# Patient Record
Sex: Female | Born: 1965 | Race: Black or African American | Hispanic: No | Marital: Single | Smoking: Never smoker
Health system: Southern US, Community
[De-identification: ages and names within clinical notes are randomized; demographics above are authoritative.]

## PROBLEM LIST (undated history)

## (undated) DIAGNOSIS — IMO0002 Reserved for concepts with insufficient information to code with codable children: Secondary | ICD-10-CM

## (undated) DIAGNOSIS — Z8742 Personal history of other diseases of the female genital tract: Secondary | ICD-10-CM

## (undated) DIAGNOSIS — R062 Wheezing: Secondary | ICD-10-CM

## (undated) DIAGNOSIS — I10 Essential (primary) hypertension: Secondary | ICD-10-CM

## (undated) DIAGNOSIS — Z8639 Personal history of other endocrine, nutritional and metabolic disease: Secondary | ICD-10-CM

## (undated) DIAGNOSIS — B338 Other specified viral diseases: Secondary | ICD-10-CM

## (undated) DIAGNOSIS — H409 Unspecified glaucoma: Secondary | ICD-10-CM

## (undated) DIAGNOSIS — H209 Unspecified iridocyclitis: Secondary | ICD-10-CM

## (undated) DIAGNOSIS — J45909 Unspecified asthma, uncomplicated: Secondary | ICD-10-CM

## (undated) DIAGNOSIS — K6389 Other specified diseases of intestine: Secondary | ICD-10-CM

## (undated) DIAGNOSIS — B974 Respiratory syncytial virus as the cause of diseases classified elsewhere: Secondary | ICD-10-CM

## (undated) DIAGNOSIS — R87619 Unspecified abnormal cytological findings in specimens from cervix uteri: Secondary | ICD-10-CM

## (undated) HISTORY — DX: Personal history of other endocrine, nutritional and metabolic disease: Z86.39

## (undated) HISTORY — DX: Essential (primary) hypertension: I10

## (undated) HISTORY — DX: Reserved for concepts with insufficient information to code with codable children: IMO0002

## (undated) HISTORY — DX: Respiratory syncytial virus as the cause of diseases classified elsewhere: B97.4

## (undated) HISTORY — PX: CHOLECYSTECTOMY: SHX55

## (undated) HISTORY — DX: Unspecified asthma, uncomplicated: J45.909

## (undated) HISTORY — DX: Wheezing: R06.2

## (undated) HISTORY — PX: COLONOSCOPY: SHX174

## (undated) HISTORY — DX: Unspecified iridocyclitis: H20.9

## (undated) HISTORY — DX: Personal history of other diseases of the female genital tract: Z87.42

## (undated) HISTORY — DX: Other specified viral diseases: B33.8

## (undated) HISTORY — DX: Other specified diseases of intestine: K63.89

---

## 1997-05-15 DIAGNOSIS — Z8639 Personal history of other endocrine, nutritional and metabolic disease: Secondary | ICD-10-CM

## 1997-05-15 HISTORY — DX: Personal history of other endocrine, nutritional and metabolic disease: Z86.39

## 1997-10-06 ENCOUNTER — Encounter: Payer: Self-pay | Admitting: Internal Medicine

## 1998-01-02 ENCOUNTER — Emergency Department (HOSPITAL_COMMUNITY): Admission: EM | Admit: 1998-01-02 | Discharge: 1998-01-02 | Payer: Self-pay | Admitting: Emergency Medicine

## 1998-01-05 ENCOUNTER — Encounter (HOSPITAL_BASED_OUTPATIENT_CLINIC_OR_DEPARTMENT_OTHER): Payer: Self-pay | Admitting: General Surgery

## 1998-01-06 ENCOUNTER — Ambulatory Visit (HOSPITAL_COMMUNITY): Admission: RE | Admit: 1998-01-06 | Discharge: 1998-01-08 | Payer: Self-pay | Admitting: General Surgery

## 2000-01-08 ENCOUNTER — Emergency Department (HOSPITAL_COMMUNITY): Admission: EM | Admit: 2000-01-08 | Discharge: 2000-01-08 | Payer: Self-pay | Admitting: Emergency Medicine

## 2000-02-03 ENCOUNTER — Encounter (INDEPENDENT_AMBULATORY_CARE_PROVIDER_SITE_OTHER): Payer: Self-pay | Admitting: Specialist

## 2000-02-03 ENCOUNTER — Other Ambulatory Visit: Admission: RE | Admit: 2000-02-03 | Discharge: 2000-02-03 | Payer: Self-pay | Admitting: Obstetrics and Gynecology

## 2000-02-10 ENCOUNTER — Encounter (INDEPENDENT_AMBULATORY_CARE_PROVIDER_SITE_OTHER): Payer: Self-pay | Admitting: Specialist

## 2000-02-10 ENCOUNTER — Other Ambulatory Visit: Admission: RE | Admit: 2000-02-10 | Discharge: 2000-02-10 | Payer: Self-pay | Admitting: Obstetrics and Gynecology

## 2000-09-04 ENCOUNTER — Other Ambulatory Visit: Admission: RE | Admit: 2000-09-04 | Discharge: 2000-09-04 | Payer: Self-pay | Admitting: Obstetrics and Gynecology

## 2000-11-29 ENCOUNTER — Encounter: Payer: Self-pay | Admitting: Internal Medicine

## 2000-12-03 ENCOUNTER — Other Ambulatory Visit: Admission: RE | Admit: 2000-12-03 | Discharge: 2000-12-03 | Payer: Self-pay | Admitting: Obstetrics and Gynecology

## 2001-03-04 ENCOUNTER — Other Ambulatory Visit: Admission: RE | Admit: 2001-03-04 | Discharge: 2001-03-04 | Payer: Self-pay | Admitting: Internal Medicine

## 2006-11-19 ENCOUNTER — Emergency Department (HOSPITAL_COMMUNITY): Admission: EM | Admit: 2006-11-19 | Discharge: 2006-11-20 | Payer: Self-pay | Admitting: Emergency Medicine

## 2006-12-11 DIAGNOSIS — E785 Hyperlipidemia, unspecified: Secondary | ICD-10-CM | POA: Insufficient documentation

## 2006-12-12 ENCOUNTER — Ambulatory Visit: Payer: Self-pay | Admitting: Internal Medicine

## 2006-12-12 DIAGNOSIS — I10 Essential (primary) hypertension: Secondary | ICD-10-CM | POA: Insufficient documentation

## 2006-12-12 DIAGNOSIS — H209 Unspecified iridocyclitis: Secondary | ICD-10-CM | POA: Insufficient documentation

## 2006-12-13 ENCOUNTER — Ambulatory Visit: Payer: Self-pay | Admitting: Internal Medicine

## 2006-12-17 ENCOUNTER — Ambulatory Visit: Payer: Self-pay | Admitting: Internal Medicine

## 2006-12-21 ENCOUNTER — Encounter: Payer: Self-pay | Admitting: Internal Medicine

## 2006-12-25 LAB — CONVERTED CEMR LAB
Angiotensin 1 Converting Enzyme: 9 units/L (ref 9–67)
Anti Nuclear Antibody(ANA): NEGATIVE

## 2006-12-28 ENCOUNTER — Telehealth: Payer: Self-pay | Admitting: Internal Medicine

## 2007-01-04 ENCOUNTER — Encounter: Payer: Self-pay | Admitting: Internal Medicine

## 2007-01-08 ENCOUNTER — Ambulatory Visit: Payer: Self-pay | Admitting: Internal Medicine

## 2007-01-10 ENCOUNTER — Ambulatory Visit: Payer: Self-pay | Admitting: Internal Medicine

## 2007-01-18 ENCOUNTER — Telehealth: Payer: Self-pay | Admitting: Internal Medicine

## 2007-02-06 ENCOUNTER — Telehealth: Payer: Self-pay | Admitting: Internal Medicine

## 2007-02-19 ENCOUNTER — Telehealth: Payer: Self-pay | Admitting: Family Medicine

## 2007-05-21 ENCOUNTER — Telehealth (INDEPENDENT_AMBULATORY_CARE_PROVIDER_SITE_OTHER): Payer: Self-pay | Admitting: *Deleted

## 2008-01-06 ENCOUNTER — Telehealth: Payer: Self-pay | Admitting: *Deleted

## 2008-05-01 ENCOUNTER — Ambulatory Visit: Payer: Self-pay | Admitting: Internal Medicine

## 2008-05-01 LAB — CONVERTED CEMR LAB
ALT: 14 units/L (ref 0–35)
AST: 20 units/L (ref 0–37)
Albumin: 3.8 g/dL (ref 3.5–5.2)
Alkaline Phosphatase: 49 units/L (ref 39–117)
BUN: 13 mg/dL (ref 6–23)
Basophils Absolute: 0 10*3/uL (ref 0.0–0.1)
Basophils Relative: 0.4 % (ref 0.0–3.0)
Bilirubin Urine: NEGATIVE
Bilirubin, Direct: 0.1 mg/dL (ref 0.0–0.3)
Blood in Urine, dipstick: NEGATIVE
CO2: 28 meq/L (ref 19–32)
Calcium: 9.9 mg/dL (ref 8.4–10.5)
Chloride: 106 meq/L (ref 96–112)
Cholesterol: 177 mg/dL (ref 0–200)
Creatinine, Ser: 0.8 mg/dL (ref 0.4–1.2)
Eosinophils Absolute: 0.1 10*3/uL (ref 0.0–0.7)
Eosinophils Relative: 1.5 % (ref 0.0–5.0)
GFR calc Af Amer: 101 mL/min
GFR calc non Af Amer: 83 mL/min
Glucose, Bld: 85 mg/dL (ref 70–99)
Glucose, Urine, Semiquant: NEGATIVE
HCT: 37.2 % (ref 36.0–46.0)
HDL: 41.1 mg/dL (ref >39.0)
Hemoglobin: 12.1 g/dL (ref 12.0–15.0)
Ketones, urine, test strip: NEGATIVE
LDL Cholesterol: 121 mg/dL — ABNORMAL HIGH (ref 0–99)
Lymphocytes Relative: 36.6 % (ref 12.0–46.0)
MCHC: 32.7 g/dL (ref 30.0–36.0)
MCV: 85 fL (ref 78.0–100.0)
Monocytes Absolute: 0.4 10*3/uL (ref 0.1–1.0)
Monocytes Relative: 9 % (ref 3.0–12.0)
Neutro Abs: 2 10*3/uL (ref 1.4–7.7)
Neutrophils Relative %: 52.5 % (ref 43.0–77.0)
Nitrite: NEGATIVE
Platelets: 173 10*3/uL (ref 150–400)
Potassium: 3.9 meq/L (ref 3.5–5.1)
Protein, U semiquant: NEGATIVE
RBC: 4.38 M/uL (ref 3.87–5.11)
RDW: 12.2 % (ref 11.5–14.6)
Sodium: 138 meq/L (ref 135–145)
Specific Gravity, Urine: 1.02
TSH: 4.2 microintl units/mL (ref 0.35–5.50)
Total Bilirubin: 0.6 mg/dL (ref 0.3–1.2)
Total CHOL/HDL Ratio: 4.3
Total Protein: 7.8 g/dL (ref 6.0–8.3)
Triglycerides: 76 mg/dL (ref 0–149)
Urobilinogen, UA: 0.2
VLDL: 15 mg/dL (ref 0–40)
WBC Urine, dipstick: NEGATIVE
WBC: 4 10*3/uL — ABNORMAL LOW (ref 4.5–10.5)
pH: 6

## 2008-05-12 ENCOUNTER — Telehealth: Payer: Self-pay | Admitting: Internal Medicine

## 2008-05-13 ENCOUNTER — Ambulatory Visit: Payer: Self-pay | Admitting: Internal Medicine

## 2008-08-25 ENCOUNTER — Encounter: Payer: Self-pay | Admitting: Internal Medicine

## 2008-10-28 ENCOUNTER — Encounter: Payer: Self-pay | Admitting: *Deleted

## 2008-10-30 ENCOUNTER — Ambulatory Visit: Payer: Self-pay | Admitting: Internal Medicine

## 2008-10-30 LAB — CONVERTED CEMR LAB
Free T4: 0.7 ng/dL (ref 0.6–1.6)
T3, Free: 2.4 pg/mL (ref 2.3–4.2)
TSH: 4.63 microintl units/mL (ref 0.35–5.50)
Thyroperoxidase Ab SerPl-aCnc: 3687.2 — ABNORMAL HIGH (ref 0.0–60.0)

## 2008-11-09 ENCOUNTER — Telehealth: Payer: Self-pay | Admitting: *Deleted

## 2008-11-20 ENCOUNTER — Ambulatory Visit: Payer: Self-pay | Admitting: Internal Medicine

## 2008-11-20 DIAGNOSIS — E063 Autoimmune thyroiditis: Secondary | ICD-10-CM | POA: Insufficient documentation

## 2008-12-18 ENCOUNTER — Encounter: Admission: RE | Admit: 2008-12-18 | Discharge: 2008-12-18 | Payer: Self-pay | Admitting: Internal Medicine

## 2009-09-15 ENCOUNTER — Telehealth: Payer: Self-pay | Admitting: *Deleted

## 2009-10-24 ENCOUNTER — Encounter (INDEPENDENT_AMBULATORY_CARE_PROVIDER_SITE_OTHER): Payer: Self-pay | Admitting: *Deleted

## 2009-10-24 ENCOUNTER — Emergency Department (HOSPITAL_COMMUNITY): Admission: EM | Admit: 2009-10-24 | Discharge: 2009-10-24 | Payer: Self-pay | Admitting: Emergency Medicine

## 2009-10-28 ENCOUNTER — Ambulatory Visit: Payer: Self-pay | Admitting: Internal Medicine

## 2009-10-28 LAB — CONVERTED CEMR LAB
ALT: 16 units/L (ref 0–35)
AST: 23 units/L (ref 0–37)
Albumin: 3.7 g/dL (ref 3.5–5.2)
Alkaline Phosphatase: 49 units/L (ref 39–117)
Basophils Absolute: 0 10*3/uL (ref 0.0–0.1)
Basophils Relative: 0.7 % (ref 0.0–3.0)
Blood in Urine, dipstick: NEGATIVE
Calcium: 9.6 mg/dL (ref 8.4–10.5)
Eosinophils Relative: 2.5 % (ref 0.0–5.0)
GFR calc non Af Amer: 104.34 mL/min (ref >60)
Glucose, Urine, Semiquant: NEGATIVE
HCT: 35.3 % — ABNORMAL LOW (ref 36.0–46.0)
HDL: 46.5 mg/dL (ref >39.00)
Hemoglobin: 11.8 g/dL — ABNORMAL LOW (ref 12.0–15.0)
LDL Cholesterol: 100 mg/dL — ABNORMAL HIGH (ref 0–99)
Lymphocytes Relative: 32.1 % (ref 12.0–46.0)
Monocytes Relative: 10.7 % (ref 3.0–12.0)
Neutro Abs: 2.3 10*3/uL (ref 1.4–7.7)
Nitrite: NEGATIVE
Potassium: 3.8 meq/L (ref 3.5–5.1)
RBC: 4.35 M/uL (ref 3.87–5.11)
RDW: 13.5 % (ref 11.5–14.6)
Sodium: 142 meq/L (ref 135–145)
Total CHOL/HDL Ratio: 3
VLDL: 15 mg/dL (ref 0.0–40.0)
WBC: 4.3 10*3/uL — ABNORMAL LOW (ref 4.5–10.5)

## 2009-11-01 ENCOUNTER — Ambulatory Visit: Payer: Self-pay | Admitting: Internal Medicine

## 2009-11-03 ENCOUNTER — Ambulatory Visit: Payer: Self-pay | Admitting: Internal Medicine

## 2009-11-03 DIAGNOSIS — D649 Anemia, unspecified: Secondary | ICD-10-CM | POA: Insufficient documentation

## 2009-11-04 ENCOUNTER — Encounter (INDEPENDENT_AMBULATORY_CARE_PROVIDER_SITE_OTHER): Payer: Self-pay | Admitting: *Deleted

## 2009-11-25 ENCOUNTER — Ambulatory Visit: Payer: Self-pay | Admitting: Internal Medicine

## 2009-11-25 DIAGNOSIS — R933 Abnormal findings on diagnostic imaging of other parts of digestive tract: Secondary | ICD-10-CM | POA: Insufficient documentation

## 2009-12-06 ENCOUNTER — Ambulatory Visit: Payer: Self-pay | Admitting: Internal Medicine

## 2009-12-09 ENCOUNTER — Telehealth: Payer: Self-pay | Admitting: Internal Medicine

## 2010-03-07 ENCOUNTER — Ambulatory Visit: Payer: Self-pay | Admitting: Internal Medicine

## 2010-03-17 LAB — CONVERTED CEMR LAB: Free T4: 0.69 ng/dL (ref 0.60–1.60)

## 2010-05-15 NOTE — L&D Delivery Note (Signed)
Delivery Note At 7:06 PM a viable female was delivered via Vaginal, Spontaneous Delivery (Presentation: Left Occiput Anterior).  APGAR: 9, 9; weight 8lbs 3 oz. .   Placenta status: Intact, Spontaneous.  Cord: 3 vessels with the following complications: None.  Anesthesia: Epidural  Episiotomy: None Lacerations: 2nd degree Suture Repair: 3.0 vicryl rapide Est. Blood Loss (mL):   Mom to postpartum.  Baby to nursery-stable. Patient desires BTL.  Discussed options, permanency and failure rate.  She would like PPBTL tomorrow.  Will schedule.  Taishawn Smaldone D 12/30/2010, 7:28 PM

## 2010-06-07 LAB — RPR: RPR: NONREACTIVE

## 2010-06-07 LAB — ABO/RH: RH Type: NEGATIVE

## 2010-06-14 NOTE — Letter (Signed)
Summary: [facility] Instructions  [facility]  [address] [place], [place] [address]   Phone: [phone]  Fax: [phone]       Monica Estrada    1965/12/26    MRN: [id number]       Procedure Day /Date: 12/06/09 Monday     Arrival Time: 2:00 pm     Procedure Time: 3:00 pm     Location of Procedure:                    _ x_  [facility] (4th Floor)  PREPARATION FOR COLONOSCOPY WITH MIRALAX  Starting 5 days prior to your procedure (12/01/09) do not eat nuts, seeds, popcorn, corn, beans, peas,  salads, or any raw vegetables.  Do not take any fiber supplements (e.g. Metamucil, Citrucel, and Benefiber). ____________________________________________________________________________________________________   THE DAY BEFORE YOUR PROCEDURE         DATE: 12/05/09 DAY: Sunday  1   Drink clear liquids the entire day-NO SOLID FOOD  2   Do not drink anything colored red or purple.  Avoid juices with pulp.  No orange juice.  3   Drink at least 64 oz. (8 glasses) of fluid/clear liquids during the day to prevent dehydration and help the prep work efficiently.  CLEAR LIQUIDS INCLUDE: Water Jello Ice Popsicles Tea (sugar ok, no milk/cream) Powdered fruit flavored drinks Coffee (sugar ok, no milk/cream) Gatorade Juice: apple, white grape, white cranberry  Lemonade Clear bullion, consomm, broth Carbonated beverages (any kind) Strained chicken noodle soup Hard Candy  4   Mix the entire bottle of Miralax with 64 oz. of Gatorade/Powerade in the morning and put in the refrigerator to chill.  5   At 3:00 pm take 2 Dulcolax/Bisacodyl tablets.  6   At 4:30 pm take one Reglan/Metoclopramide tablet.  7  Starting at 5:00 pm drink one 8 oz glass of the Miralax mixture every 15-20 minutes until you have finished drinking the entire 64 oz.  You should finish drinking prep around 7:30 or 8:00 pm.  8   If you are nauseated, you may take the 2nd Reglan/Metoclopramide tablet at  6:30 pm.        9    At 8:00 pm take 2 more DULCOLAX/Bisacodyl tablets.         THE DAY OF YOUR PROCEDURE      DATE:  12/06/09 DAY: Monday  You may drink clear liquids until 1:00 pm  (2 HOURS BEFORE PROCEDURE).   MEDICATION INSTRUCTIONS  Unless otherwise instructed, you should take regular prescription medications with a small sip of water as early as possible the morning of your procedure.         OTHER INSTRUCTIONS  You will need a responsible adult at least 46 years of age to accompany you and drive you home.   This person must remain in the waiting room during your procedure.  Wear loose fitting clothing that is easily removed.  Leave jewelry and other valuables at home.  However, you may wish to bring a book to read or an iPod/MP3 player to listen to music as you wait for your procedure to start.  Remove all body piercing jewelry and leave at home.  Total time from sign-in until discharge is approximately 2-3 hours.  You should go home directly after your procedure and rest.  You can resume normal activities the day after your procedure.  The day of your procedure you should not:   Drive  Make legal decisions   Operate machinery   Drink alcohol   Return to work  You will receive specific instructions about eating, activities and medications before you leave.   The above instructions have been reviewed and explained to me by   Lamona Curl CMA (AAMA)  November 25, 2009 12:12 PM     I fully understand and can verbalize these instructions _____________________________ Date 11/25/09

## 2010-06-14 NOTE — Progress Notes (Signed)
Summary: Pt called re: refill of Lisinopril.  Phone Note Call from Patient Call back at [facility] Phone [phone]   Caller: Patient Summary of Call: Pt called re: refill of Lisinopril. Pt said that she has made and appt for December 2011, because pt was told that as long as she made an ov, that med would be refilled. Please call.  Initial call taken by: Lucy Antigua,  Sep 15, 2009 3:23 PM  Follow-up for Phone Call        Pt was suppose to have a cpx in Dec of last year. I copied and pasted the Dec by accident to get the correct lab orders in. I tried to call pt back but number was busy. Follow-up by: Romualdo Bolk, CMA Duncan Dull),  Sep 15, 2009 5:01 PM  Additional Follow-up for Phone Call Additional follow up Details #1::        see refill request. Additional Follow-up by: Romualdo Bolk, CMA (AAMA),  Sep 17, 2009 12:52 PM

## 2010-06-14 NOTE — Procedures (Signed)
Summary: Colonoscopy  Patient: [name] Note: All result statuses are Final unless otherwise noted.  Tests: (1) Colonoscopy (COL)   COL Colonoscopy           DONE     [facility]     [address]     [place], [place]  [address]           COLONOSCOPY PROCEDURE REPORT           PATIENT:  Monica Estrada, Monica Estrada  MR#:  [id number]     BIRTHDATE:  Apr 06, 1966, 43 yrs. old  GENDER:  female     ENDOSCOPIST:  Hedwig Morton. Juanda Chance, MD     REF. BY:  Neta Mends. Panosh, M.D.     PROCEDURE DATE:  12/06/2009     PROCEDURE:  Colonoscopy [address]     ASA CLASS:  Class I     INDICATIONS:  Abdominal pain, Abnormal CT of abdomen about 6 wks     ago severe abd. pain, CT scan in ER showed acute colitis/     diverticulitis in transverse colon and hepatic flexure, she is now     well, she has been on antihypertensives     MEDICATIONS:   Versed 6 mg, Fentanyl 75 mcg           DESCRIPTION OF PROCEDURE:   After the risks benefits and     alternatives of the procedure were thoroughly explained, informed     consent was obtained.  Digital rectal exam was performed and     revealed no rectal masses.   The LB [id number] endoscope     was introduced through the anus and advanced to the cecum, which     was identified by both the appendix and ileocecal valve, without     limitations.  The quality of the prep was good, using MiraLax.     The instrument was then slowly withdrawn as the colon was fully     examined.     <<PROCEDUREIMAGES>>           FINDINGS:  No polyps or cancers were seen (see image1, image2,     image3, and image4). no evidence of diverticulosis or colitis     Retroflexed views in the rectum revealed no abnormalities.    The     scope was then withdrawn from the patient and the procedure     completed.           COMPLICATIONS:  None     ENDOSCOPIC IMPRESSION:     1) No polyps or cancers     normal colonic mucosa, no evidence of diverticulosis/itis or     colitis. I suspect pt had  ischemic colitis     RECOMMENDATIONS:     1) high fiber diet     keep blood pressure from droppinig at night, check BP's at home     at different times     REPEAT EXAM:  In 10 year(s) for.           ______________________________     Hedwig Morton. Juanda Chance, MD           CC:           n.     eSIGNED:   Hedwig Morton. Ory Elting at 12/06/2009 03:42 PM           Monica Estrada, Monica Estrada, [id number]  Note: An exclamation mark (!) indicates a result that was not dispersed into the flowsheet. Document Creation Date:  12/06/2009 3:42 PM _______________________________________________________________________  (1) Order result status: Final Collection or observation date-time: 12/06/2009 15:32 Requested date-time:  Receipt date-time:  Reported date-time:  Referring Physician:   Ordering Physician: Lina Sar [phone]) Specimen Source:  Source: Launa Grill Order Number: [id number] Lab site:   Appended Document: Colonoscopy    Clinical Lists Changes  Observations: Added new observation of COLONNXTDUE: 11/2019 (12/06/2009 15:45)

## 2010-06-14 NOTE — Progress Notes (Signed)
Summary: speak to nurse  Phone Note Call from Patient Call back at Home Phone [phone]   Caller: Patient Call For: Juanda Chance Reason for Call: Talk to Nurse Summary of Call: Patient wants to speak to nurse regarding when would she have a regular bm after procedure Monday. Initial call taken by: Tawni Levy,  December 09, 2009 1:53 PM  Follow-up for Phone Call        Patient  had colon on Monday.  No BM since the procedure.  She reports she is passing gas, no bloating, but slight discomfort from constipation.  Patient  tolerating a normal diet and her normal daily activities.  She is advised to take a dose of MOM or Miralax 17 gm as needed.  She will call back for further problems, questions, or concerns. Follow-up by: Darcey Nora RN, CGRN,  December 09, 2009 3:03 PM  Additional Follow-up for Phone Call Additional follow up Details #1::        reviewed and agree Additional Follow-up by: Hart Carwin MD,  December 11, 2009 12:03 PM

## 2010-06-14 NOTE — Assessment & Plan Note (Signed)
Summary: Post hosp/colitis/dm   Vital Signs:  Patient profile:   45 year old female Menstrual status:  regular LMP:     10/18/2009 Height:      64 inches Weight:      163 pounds BMI:     28.08 Pulse rate:   60 / minute BP sitting:   120 / 80  (left arm) Cuff size:   regular  Vitals Entered By: Romualdo Bolk, CMA (AAMA) (November 01, 2009 3:09 PM) CC: Post ED follow up . Pt was dx with colititis. Pt was given both flagyl and cipro but finished it up. Pt is feeling better but still has some pain and swelling around 2-3 am. Some diarrhea with this pain and cramping. Pain and cramping is in the center of her abd. area. Some nausea with it but no vomiting. No fever. LMP (date): 10/18/2009 LMP - Character: heavy Menarche (age onset years): 15   Menses interval (days): 28 Menstrual flow (days): 3 Enter LMP: 10/18/2009   History of Present Illness: Monica Estrada comes in today   follow up form ED on Sunday  6/12 /11. Rx for colitis diverticultits  after  presenting with one day of abd cramps severe  and no fever . Had blood tests and  ct of abd pelvis which showed  mild  edema of left colon  see report .  diff dx colitis divertic , omental infarct.  Never had this pains this bad before   . No diarrhea or blood  but  soft stools.   About  80 % better  today at end of 7 days med  .Still    feels bloated and bowels not that right . NO blood . No hx of same . No antibioitc rx previous to onset.  taking pain med as needed Hydrocodone.    BP has been ok   Preventive Screening-Counseling & Management  Alcohol-Tobacco     Alcohol drinks/day: 0     Smoking Status: never  Caffeine-Diet-Exercise     Caffeine use/day: 2     Does Patient Exercise: yes     Type of exercise: Walk     Times/week: 2  Current Medications (verified): 1)  Lisinopril-Hydrochlorothiazide 20-12.5 Mg  Tabs (Lisinopril-Hydrochlorothiazide) .... 1/2 To 1 Per Day  or As Directed  Allergies (verified): No Known Drug  Allergies  Past History:  Past medical, surgical, family and social histories (including risk factors) reviewed, and no changes noted (except as noted below).  Past Medical History: Reviewed history from 05/13/2008 and no changes required. see database uveitis and Hypertension G1P1  Hx of abnormal PAP Hx post partum hypothyroidism  1999         LAST Mammogram: 2 years ago Pap: 2009 Td: 5 years ago Colonscopy: n/a EKG: n/a Dexa: n/a Eye Exam: 200 Smoking: Never  Consults: Dr Ambrose Mantle  Past Surgical History: Reviewed history from 05/13/2008 and no changes required. Cholecystectomy 1999  Past History:  Care Management: Gynecology: Ambrose Mantle  Family History: Reviewed history from 05/13/2008 and no changes required. Family History of CAD Female 1st degree relative  mi 45  Family History Diabetes 1st degree relative SIS with lupus and RA SIS lamb disease see database strong family history of heart disease diabetes and arthritis. Father: diabetic and HBP Mother: HBP, diabetic, thyroid problems Siblings: Brother- Diabetic Son-Diabetic  Social History: Reviewed history from 05/13/2008 and no changes required. 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  The patient denies anorexia, fever, weight loss, vision loss, decreased hearing, hoarseness, chest pain, dyspnea on exertion, peripheral edema, prolonged cough, headaches, melena, hematochezia, hematuria, incontinence, muscle weakness, difficulty walking, depression, abnormal bleeding, enlarged lymph nodes, angioedema, and breast masses.    Physical Exam  General:  alert, well-developed, and well-nourished.   in nad Head:  normocephalic and atraumatic.   Eyes:  vision grossly intact.   Neck:  No deformities, masses, or tenderness noted. Lungs:  Normal respiratory effort, chest expands symmetrically. Lungs are clear to  auscultation, no crackles or wheezes. Heart:  Normal rate and regular rhythm. S1 and S2 normal without gallop, murmur, click, rub or other extra sounds. Abdomen:  soft, normal bowel sounds, no hepatomegaly, and no splenomegaly.  mild tenderness llq  noted no g or revound and no masses  Pulses:  nl cap refill  Extremities:  no clubbing cyanosis or edema  Neurologic:  non focal  Skin:  turgor normal, color normal, no ecchymoses, no petechiae, and no purpura.   Cervical Nodes:  No lymphadenopathy noted Psych:  Oriented X3, good eye contact, not anxious appearing, and not depressed appearing.   ED record report reviewed  Impression & Recommendations:  Problem # 1:  COLITIS VS POSS DIVERTICULITIS (ICD-558.9)  abd pain   seems to respond to rx for diverticulitis   . doies have hx of Uveitis and autoimmune disease in family but not really typical of IBD or UC. .    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    will discuss at cpx but  no signs related to this  Problem # 3:  HYPERTENSION (ICD-401.9) stable  Her updated medication list for this problem includes:    Lisinopril-hydrochlorothiazide 20-12.5 Mg Tabs (Lisinopril-hydrochlorothiazide) .Marland Kitchen... 1/2 to 1 per day  or as directed  Complete Medication List: 1)  Lisinopril-hydrochlorothiazide 20-12.5 Mg Tabs (Lisinopril-hydrochlorothiazide) .... 1/2 to 1 per day  or as directed 2)  Ciprofloxacin Hcl 500 Mg Tabs (Ciprofloxacin hcl) .Marland Kitchen.. 1 by mouth two times a day 3)  Flagyl 500 Mg Tabs (Metronidazole) .Marland Kitchen.. 1 by mouth  three times a day  Patient Instructions: 1)  continue  antibiotic for another  5 days   2)  your thyroid was a bit off and   will  have to follow this up. 3)  Will rec   Gi consult  also.  4)  Keep check up appt this week.  Prescriptions: FLAGYL 500 MG TABS (METRONIDAZOLE) 1 by mouth   three times a day  #25 x 0   Entered and Authorized by:   Madelin Headings MD   Signed by:   Madelin Headings MD on 11/01/2009   Method used:   Electronically to        [facility] (retail)       [address]       [place], [place]  [address]       Ph: [phone]       Fax: [phone]   RxID:   [id number] CIPROFLOXACIN HCL 500 MG TABS (CIPROFLOXACIN HCL) 1 by mouth two times a day  #10 x 0   Entered and Authorized by:   Madelin Headings MD   Signed  by:   Madelin Headings MD on 11/01/2009   Method used:   Electronically to        [facility] (retail)       [address]       [place], [place]  [address]       Ph: [phone]       Fax: [phone]   RxID:   [id number]

## 2010-06-14 NOTE — Assessment & Plan Note (Signed)
Summary: cpx with pap/ssc   Vital Signs:  Patient profile:   45 year old female Menstrual status:  regular LMP:     10/18/2009 Height:      63.75 inches Weight:      163 pounds BMI:     28.30 Pulse rate:   60 / minute BP sitting:   120 / 80  (left arm) Cuff size:   regular  Vitals Entered By: Romualdo Bolk, CMA Duncan Dull) (November 03, 2009 2:22 PM) CC: CPX no pap- Pt has a gyn who does pap LMP (date): 10/18/2009 LMP - Character: heavy Menarche (age onset years): 15   Menses interval (days): 28 Menstrual flow (days): 3 Enter LMP: 10/18/2009 Last PAP Result normal   History of Present Illness: Monica Estrada comes in today  for preventive visit and follow up of abd pain colitis ? diverticulitis as discussed .  Gi  much better  on continued antibioitc.  not tendern anymore no fever   began to exercise again BP : nl when checked and no se of med Periods ; sees Dr Ambrose Mantle   has been midly anemic before and thinks from  heavy menses. no gi bleeding described.  Preventive Care Screening  Pap Smear:    Date:  12/13/2008    Results:  normal   Prior Values:    PPD:  negative (12/17/2006)    Mammogram:  ASSESSMENT: Negative - BI-RADS 1^MM DIGITAL SCREENING (12/18/2008)    Last Tetanus Booster:  Historical (05/15/2002)   Preventive Screening-Counseling & Management  Alcohol-Tobacco     Alcohol drinks/day: 0     Smoking Status: never  Caffeine-Diet-Exercise     Caffeine use/day: 2     Does Patient Exercise: yes     Type of exercise: Walk     Times/week: 2  Hep-HIV-STD-Contraception     Dental Visit-last 6 months yes  Safety-Violence-Falls     Seat Belt Use: yes     Firearms in the Home: no firearms in the home     Smoke Detectors: yes     Fall Risk: none       Blood Transfusions:  no.    Current Medications (verified): 1)  Lisinopril-Hydrochlorothiazide 20-12.5 Mg  Tabs (Lisinopril-Hydrochlorothiazide) .... 1/2 To 1 Per Day  or As Directed 2)  Ciprofloxacin Hcl 500  Mg Tabs (Ciprofloxacin Hcl) .Marland Kitchen.. 1 By Mouth Two Times A Day 3)  Flagyl 500 Mg Tabs (Metronidazole) .Marland Kitchen.. 1 By Mouth  Three Times A Day  Allergies (verified): No Known Drug Allergies  Past History:  Past medical, surgical, family and social histories (including risk factors) reviewed, and no changes noted (except as noted below).  Past Medical History: see database  uveitis stable Hypertension G1P1  Hx of abnormal PAP Hx post partum hypothyroidism  1999 ED eval  abd CT mild  left colon edema         LAST Mammogram: 2 years ago Pap: 2009 Td: 5 years ago Colonscopy: n/a EKG: n/a Dexa: n/a Eye Exam: 200 Smoking: Never  Consults: Dr Ambrose Mantle  Past Surgical History: Reviewed history from 05/13/2008 and no changes required. Cholecystectomy 1999  Past History:  Care Management: Gynecology: Ambrose Mantle eye regular  care   Family History: Reviewed history from 05/13/2008 and no changes required. Family History of CAD Female 1st degree relative  mi 63  Family History Diabetes 1st degree relative SIS with lupus and RA SIS lamb disease see database strong family history of heart disease diabetes and arthritis. Father: diabetic and  HBP Mother: HBP, diabetic, thyroid problems Siblings: Brother- Diabetic Son-Diabetic  Social History: Reviewed history from 05/13/2008 and no changes required. 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   Physical Exam General Appearance: well developed, well nourished, no acute distress Eyes: conjunctiva and lids normal, PERRLA, EOMI, WNL  glasses Ears, Nose, Mouth, Throat: TM clear, nares clear, oral exam WNL Neck: supple, no lymphadenopathy, no thyromegaly, no JVD Respiratory: clear to auscultation and percussion, respiratory effort normal Cardiovascular: regular rate and rhythm, S1-S2, no murmur, rub or gallop, no bruits, peripheral pulses normal and symmetric, no cyanosis, clubbing, edema or varicosities Chest: no scars, masses, tenderness; no asymmetry, skin changes, nipple discharge   Gastrointestinal: soft, no to minimall tender llq ; no hepatosplenomegaly, masses; active bowel sounds all quadrants,  Genitourinary: per   GYNE Lymphatic: no cervical, axillary or inguinal adenopathy Musculoskeletal: gait normal, muscle tone and strength WNL, no joint swelling, effusions, discoloration, crepitus  Skin: clear, good turgor, color WNL, no rashes, lesions, or ulcerations Neurologic: normal mental status, normal reflexes, normal strength, sensation, and motion Psychiatric: alert; oriented to person, place and time Other Exam:  see labs     Impression & Recommendations:  Problem # 1:  HEALTH MAINTENANCE EXAM, ADULT (ICD-V70.0) Discussed nutrition,exercise,diet,healthy weight, vitamin D and calcium.   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   will wait for now and follow more closely.   repeat in 3 months rx if worsening or gets signs   Problem # 3:  HYPERTENSION (ICD-401.9) controlled  Her updated medication list for this problem includes:    Lisinopril-hydrochlorothiazide 20-12.5 Mg Tabs (Lisinopril-hydrochlorothiazide) .Marland Kitchen... 1/2 to 1 per day  or as directed  BP today: 120/80 Prior BP: 110/70 (11/20/2008)  Labs Reviewed: K+: 3.8 (10/28/2009) Creat: : 0.8 (10/28/2009)   Chol:  161 (10/28/2009)   HDL: 46.50 (10/28/2009)   LDL: 100 (10/28/2009)   TG: 75.0 (10/28/2009)  Problem # 4:  COLITIS VS POSS DIVERTICULITIS (ICD-558.9) Assessment: Improved  almost back to normal..   will do GI referral  may need colonsocopy.    see past note hx of uveitis and fam hx of auttoimmune disease but thius may be diverticulitis with reponse to antibioitcs.  Orders: Gastroenterology Referral (GI)  Problem # 5:  ANEMIA, MILD (ICD-285.9)  Hgb: 11.8 (10/28/2009)   Hct: 35.3 (10/28/2009)   Platelets: 175.0 (10/28/2009) RBC: 4.35 (10/28/2009)   RDW: 13.5 (10/28/2009)   WBC: 4.3 (10/28/2009) MCV: 81.1 (10/28/2009)   MCHC: 33.5 (10/28/2009) TSH: 6.60 (10/28/2009)  Complete Medication List: 1)  Lisinopril-hydrochlorothiazide 20-12.5 Mg Tabs (Lisinopril-hydrochlorothiazide) .... 1/2 to 1 per day  or as directed 2)  Ciprofloxacin Hcl 500 Mg Tabs (Ciprofloxacin hcl) .Marland Kitchen.. 1 by mouth two times a day 3)  Flagyl 500 Mg Tabs (Metronidazole) .Marland Kitchen.. 1 by mouth  three times a day  Patient Instructions: 1)  You are slightly anemic and could be from your period. 2)  Repeat  TSH and free T4 in  3 months.  and follow up depending on results. dx 245.2 3)  Otherwise check yearly check up.  4)  Someone will contact you about  a  Gastroenterology referral.  in the meantime  5)  call if increasing pain  again  6)  Continue BP medications . 7)  Healthy diet and exercise .

## 2010-06-14 NOTE — Assessment & Plan Note (Signed)
Summary: ABD PAIN/SIGHTLY ABD CT SCAN/YF   History of Present Illness Visit Type: consult  Primary GI MD: Lina Sar MD Primary Provider: Berniece Andreas, MD  Requesting Provider: Berniece Andreas, MD  Chief Complaint: Lower abd pain, and diarrhea  History of Present Illness:   This is a 45 year old African American female who is referred for evaluation of acute mid abdominal pain which occurred rather suddenly about a month ago necessitating evaluation in the emergency room. She woke up in the morning and went to the bathroom to have a bowel movement which appeared to be normal but afterwards developed severe abdominal pain which doubled her over. Pain persisted and she had to go to the emergency room where a CT scan of the abdomen and pelvis showed inflammatory changes in the hepatic flexure and transverse colon  consistent with either colitis or diverticulitis. There was also a questionable omental infarct. She was sent home on bowel rest, pain medications and antibiotics for 1 week.. She is now 90% improved and is back to work and tolerating  regular diet. She still has  crampy abdominal pain but no fever. There is no family history of colon cancer in direct relatives. Her maternal uncle might have had colon cancer. There is no family history of inflammatory bowel disease. She does not have a significant GI history otherwise.   GI Review of Systems    Reports abdominal pain.     Location of  Abdominal pain: lower abdomen.    Denies acid reflux, belching, bloating, chest pain, dysphagia with liquids, dysphagia with solids, heartburn, loss of appetite, nausea, vomiting, vomiting blood, weight loss, and  weight gain.      Reports diarrhea.     Denies anal fissure, black tarry stools, change in bowel habit, constipation, diverticulosis, fecal incontinence, heme positive stool, hemorrhoids, irritable bowel syndrome, jaundice, light color stool, liver problems, rectal bleeding, and  rectal pain.     Current Medications (verified): 1)  Lisinopril-Hydrochlorothiazide 20-12.5 Mg  Tabs (Lisinopril-Hydrochlorothiazide) .... 1/2 To 1 Per Day  or As Directed  Allergies (verified): No Known Drug Allergies  Past History:  Past Medical History: Reviewed history from 11/03/2009 and no changes required. see database  uveitis stable Hypertension G1P1  Hx of abnormal PAP Hx post partum hypothyroidism  1999 ED eval  abd CT mild  left colon edema         LAST Mammogram: 2 years ago Pap: 2009 Td: 5 years ago Colonscopy: n/a EKG: n/a Dexa: n/a Eye Exam: 200 Smoking: Never  Consults: Dr Ambrose Mantle  Past Surgical History: Reviewed history from 05/13/2008 and no changes required. Cholecystectomy 1999  Family History: Family History of CAD Female 1st degree relative  mi 50  Family History Diabetes 1st degree relative SIS with lupus and RA SIS lamb disease see database strong family history of heart disease diabetes and arthritis. Father: diabetic and HBP Mother: HBP, diabetic, thyroid problems Siblings: Brother- Diabetic Son-Diabetic No FH of Colon Cancer:  Social History: [name] Single Never Smoked Alcohol use-no Household of 2 pet bird hhof 2    60 yo son  Review of Systems       The patient complains of muscle pains/cramps.  The patient denies allergy/sinus, anemia, anxiety-new, arthritis/joint pain, back pain, blood in urine, breast changes/lumps, change in vision, confusion, cough, coughing up blood, depression-new, fainting, fatigue, fever, headaches-new, hearing problems, heart murmur, heart rhythm changes, itching, menstrual pain, night sweats, nosebleeds, pregnancy symptoms, shortness of breath, skin rash, sleeping problems, sore throat, swelling  of feet/legs, swollen lymph glands, thirst - excessive , urination - excessive , urination changes/pain, urine leakage, vision changes, and voice change.         Pertinent positive and negative review of systems were noted in  the above HPI. All other ROS was otherwise negative.   Vital Signs:  Patient profile:   45 year old female Menstrual status:  regular Height:      63.75 inches Weight:      158 pounds BMI:     27.43 BSA:     1.77 Pulse rate:   72 / minute Pulse rhythm:   regular BP sitting:   132 / 80  (left arm) Cuff size:   regular  Vitals Entered By: Ok Anis CMA (November 25, 2009 11:03 AM)  Physical Exam  General:  Well developed, well nourished, no acute distress. Eyes:  PERRLA, no icterus. Mouth:  No deformity or lesions, dentition normal. Neck:  Supple; no masses or thyromegaly. Lungs:  Clear throughout to auscultation. Heart:  Regular rate and rhythm; no murmurs, rubs,  or bruits. Abdomen:  Soft relaxed abdomen with mild tenderness across the periumbilical area starting in the left middle quadrant and ascending to right middle quadrant. There is no rebound, distention or palpable mass. Lower abdomen is unremarkable. There are postcholecystectomy scars from a laparoscopic cholecystectomy. Rectal:  soft Hemoccult negative stool. Extremities:  No clubbing, cyanosis, edema or deformities noted. Skin:  Intact without significant lesions or rashes. Psych:  Alert and cooperative. Normal mood and affect.   Impression & Recommendations:  Problem # 1:  COLITIS VS POSS DIVERTICULITIS (ICD-558.9) Patient is status post episode of  acute abdominal pain ,abnormal CT scan suggestive of an inflammatory process in the transverse colon. Possibilities include ischemic colitis, diverticulitis, volvulus, mesenteric infarct or possibly inflammatory bowel disease which is less likely. We will proceed with a diagnostic colonoscopy and biopsies. There is no need for repeat antibiotics ,  Other Orders: Colonoscopy (Colon)  Patient Instructions: 1)  Colonoscopy using MiraLax prep. Patient instructed and scheduled. 2)  Depending on the results, we will make a final disposition. 3)  Copy sent to : Dr Fabian Sharp 4)   The medication list was reviewed and reconciled.  All changed / newly prescribed medications were explained.  A complete medication list was provided to the patient / caregiver. Prescriptions: DULCOLAX 5 MG  TBEC (BISACODYL) Day before procedure take 2 at 3pm and 2 at 8pm.  #4 x 0   Entered by:   Lamona Curl CMA (AAMA)   Authorized by:   Hart Carwin MD   Signed by:   Lamona Curl CMA (AAMA) on 11/25/2009   Method used:   Electronically to        [facility] (retail)       [address]       [place], [place]  [address]       Ph: [phone]       Fax: [phone]   RxID:   [id number] REGLAN 10 MG  TABS (METOCLOPRAMIDE HCL) As per prep instructions.  #2 x 0   Entered by:   Lamona Curl CMA (AAMA)   Authorized by:   Hart Carwin MD   Signed by:   Lamona Curl CMA (AAMA) on 11/25/2009   Method used:   Electronically to        [facility] (retail)       [address]       [place], [place]  [address]       Ph: [phone]       Fax: [phone]   RxID:   [id number] MIRALAX   POWD (POLYETHYLENE GLYCOL 3350) As per prep  instructions.  #255gm x 0   Entered by:   Lamona Curl CMA (AAMA)   Authorized by:   Hart Carwin MD   Signed by:   Lamona Curl CMA (AAMA) on 11/25/2009   Method used:   Electronically to        [facility] (retail)       [address]       [place], [place]  [address]       Ph: [phone]       Fax: [phone]   RxID:   [id number]

## 2010-06-14 NOTE — Letter (Signed)
Summary: New Patient letter  [facility]  [address] [place], [place] [address]   Phone: [phone]  Fax: [phone]       11/04/2009 MRN: [id number]  [facility] [address] [place], [place]  [address]  Dear Ms. [facility],  Welcome to the Gastroenterology Division at [facility].    You are scheduled to see Dr.  Juanda Chance on 11-25-09 at 10:30am on the 3rd floor at [facility], [address]  We ask that you try to arrive at our office 15 minutes prior to your appointment time to allow for check-in.  We would like you to complete the enclosed self-administered evaluation form prior to your visit and bring it with you on the day of your appointment.  We will review it with you.  Also, please bring a complete list of all your medications or, if you prefer, bring the medication bottles and we will list them.  Please bring your insurance card so that we may make a copy of it.  If your insurance requires a referral to see a specialist, please bring your referral form from your primary care physician.  Co-payments are due at the time of your visit and may be paid by cash, check or credit card.     Your office visit will consist of a consult with your physician (includes a physical exam), any laboratory testing he/she may order, scheduling of any necessary diagnostic testing (e.g. x-ray, ultrasound, CT-scan), and scheduling of a procedure (e.g. Endoscopy, Colonoscopy) if required.  Please allow enough time on your schedule to allow for any/all of these possibilities.    If you cannot keep your appointment, please call [phone] to cancel or reschedule prior to your appointment date.  This allows [place] the opportunity to schedule an appointment for another patient in need of care.  If you do not cancel or reschedule by 5 p.m. the business day prior to your appointment date, you will be charged a $50.00 late cancellation/no-show fee.    Thank you for choosing [facility] for your medical needs.  We appreciate the opportunity to care for you.  Please visit [place] at our website  to learn more about our practice.                     Sincerely,                                                             The Gastroenterology Division

## 2010-08-01 LAB — CBC
HCT: 34.5 % — ABNORMAL LOW (ref 36.0–46.0)
Hemoglobin: 11.1 g/dL — ABNORMAL LOW (ref 12.0–15.0)
MCHC: 32.1 g/dL (ref 30.0–36.0)
MCV: 82.6 fL (ref 78.0–100.0)
RBC: 4.18 MIL/uL (ref 3.87–5.11)

## 2010-08-01 LAB — URINE MICROSCOPIC-ADD ON

## 2010-08-01 LAB — COMPREHENSIVE METABOLIC PANEL
Albumin: 3.6 g/dL (ref 3.5–5.2)
Alkaline Phosphatase: 54 U/L (ref 39–117)
BUN: 10 mg/dL (ref 6–23)
CO2: 23 mEq/L (ref 19–32)
Chloride: 110 mEq/L (ref 96–112)
Creatinine, Ser: 0.76 mg/dL (ref 0.4–1.2)
GFR calc non Af Amer: >60 mL/min (ref >60)
Potassium: 3.6 mEq/L (ref 3.5–5.1)
Total Bilirubin: 0.5 mg/dL (ref 0.3–1.2)

## 2010-08-01 LAB — POCT PREGNANCY, URINE: Preg Test, Ur: NEGATIVE

## 2010-08-01 LAB — URINALYSIS, ROUTINE W REFLEX MICROSCOPIC
Glucose, UA: NEGATIVE mg/dL
Protein, ur: NEGATIVE mg/dL
pH: 7.5 (ref 5.0–8.0)

## 2010-08-01 LAB — DIFFERENTIAL
Basophils Absolute: 0 10*3/uL (ref 0.0–0.1)
Basophils Relative: 1 % (ref 0–1)
Eosinophils Relative: 2 % (ref 0–5)
Lymphocytes Relative: 24 % (ref 12–46)
Monocytes Absolute: 0.3 10*3/uL (ref 0.1–1.0)
Neutro Abs: 2.3 10*3/uL (ref 1.7–7.7)

## 2010-08-01 LAB — URINE CULTURE: Colony Count: 40000

## 2010-10-21 ENCOUNTER — Encounter: Payer: Self-pay | Admitting: Internal Medicine

## 2010-10-21 DIAGNOSIS — Z8639 Personal history of other endocrine, nutritional and metabolic disease: Secondary | ICD-10-CM | POA: Insufficient documentation

## 2010-10-21 DIAGNOSIS — H209 Unspecified iridocyclitis: Secondary | ICD-10-CM | POA: Insufficient documentation

## 2010-10-21 DIAGNOSIS — K6389 Other specified diseases of intestine: Secondary | ICD-10-CM | POA: Insufficient documentation

## 2010-10-28 ENCOUNTER — Telehealth: Payer: Self-pay | Admitting: Internal Medicine

## 2010-10-28 NOTE — Telephone Encounter (Signed)
Pt is sch for cpx labs on Monday 6/18 and cpx on 11/07/10. Pt is 7 month pregnant and says that she has already had cpx by obgyn and labs done, does she still need to keep her Monday lab and cpx?

## 2010-10-28 NOTE — Telephone Encounter (Signed)
Spoke with pt and told her that since we haven't seen her in over a year to go ahead and keep these appts. She more than likely  hasn't had her lipids done and we can do everything but the pap for her cpx.

## 2010-10-31 ENCOUNTER — Other Ambulatory Visit (INDEPENDENT_AMBULATORY_CARE_PROVIDER_SITE_OTHER): Payer: Self-pay

## 2010-10-31 DIAGNOSIS — Z0000 Encounter for general adult medical examination without abnormal findings: Secondary | ICD-10-CM

## 2010-10-31 LAB — CBC WITH DIFFERENTIAL/PLATELET
Basophils Absolute: 0 10*3/uL (ref 0.0–0.1)
Eosinophils Absolute: 0.1 10*3/uL (ref 0.0–0.7)
Lymphocytes Relative: 21.3 % (ref 12.0–46.0)
MCHC: 33.3 g/dL (ref 30.0–36.0)
Monocytes Relative: 8.3 % (ref 3.0–12.0)
Neutrophils Relative %: 67.9 % (ref 43.0–77.0)
Platelets: 165 10*3/uL (ref 150.0–400.0)
RDW: 13.3 % (ref 11.5–14.6)

## 2010-10-31 LAB — BASIC METABOLIC PANEL
CO2: 22 mEq/L (ref 19–32)
Calcium: 9 mg/dL (ref 8.4–10.5)
Creatinine, Ser: 0.5 mg/dL (ref 0.4–1.2)
GFR: 188.22 mL/min (ref >60.00)
Glucose, Bld: 90 mg/dL (ref 70–99)
Sodium: 137 mEq/L (ref 135–145)

## 2010-10-31 LAB — HEPATIC FUNCTION PANEL
AST: 31 U/L (ref 0–37)
Albumin: 2.8 g/dL — ABNORMAL LOW (ref 3.5–5.2)
Alkaline Phosphatase: 78 U/L (ref 39–117)
Bilirubin, Direct: 0.1 mg/dL (ref 0.0–0.3)
Total Bilirubin: 0.4 mg/dL (ref 0.3–1.2)

## 2010-10-31 LAB — POCT URINALYSIS DIPSTICK
Ketones, UA: NEGATIVE
Protein, UA: NEGATIVE
Urobilinogen, UA: 0.2
pH, UA: 7

## 2010-10-31 LAB — LIPID PANEL
HDL: 67 mg/dL (ref >39.00)
Total CHOL/HDL Ratio: 4
VLDL: 23 mg/dL (ref 0.0–40.0)

## 2010-10-31 LAB — TSH: TSH: 5.38 u[IU]/mL (ref 0.35–5.50)

## 2010-11-07 ENCOUNTER — Encounter: Payer: Self-pay | Admitting: Internal Medicine

## 2010-11-07 ENCOUNTER — Ambulatory Visit (INDEPENDENT_AMBULATORY_CARE_PROVIDER_SITE_OTHER): Payer: 59 | Admitting: Internal Medicine

## 2010-11-07 VITALS — BP 120/80 | HR 66 | Ht 64.0 in | Wt 195.0 lb

## 2010-11-07 DIAGNOSIS — Z331 Pregnant state, incidental: Secondary | ICD-10-CM

## 2010-11-07 DIAGNOSIS — Z0000 Encounter for general adult medical examination without abnormal findings: Secondary | ICD-10-CM

## 2010-11-07 DIAGNOSIS — E063 Autoimmune thyroiditis: Secondary | ICD-10-CM

## 2010-11-07 DIAGNOSIS — D649 Anemia, unspecified: Secondary | ICD-10-CM

## 2010-11-07 DIAGNOSIS — I10 Essential (primary) hypertension: Secondary | ICD-10-CM

## 2010-11-07 DIAGNOSIS — Z862 Personal history of diseases of the blood and blood-forming organs and certain disorders involving the immune mechanism: Secondary | ICD-10-CM

## 2010-11-07 DIAGNOSIS — Z8639 Personal history of other endocrine, nutritional and metabolic disease: Secondary | ICD-10-CM

## 2010-11-07 NOTE — Progress Notes (Signed)
Subjective:    Patient ID: Monica Estrada, female    DOB: August 25, 1965, 45 y.o.   MRN: [id number]  HPI Patient is here for PV and med eval .  She is now in her 3rd trimester due August 18 with reportedly normal pregnancy. Her ob is Dr  Ambrose Mantle.   She was just dx with anemia  Began iron  Tablets 2 weeks ago.    One a day   And has been on prenatals before this  Says UTD on immunizations HT: is off meds and doing ok   Pt aware of Ci with meds. Hypothyroid hx  : recent check was normal by report   No sx.   No sig edema weight issues cp sob  Is working in retail at [employer] . [employer]   Review of Systems ROS:  GEN/ HEENTNo fever, significant weight changes sweats headaches vision problems hearing changes, CV/ PULM; No chest pain shortness of breath cough, syncope,edema  change in exercise tolerance. GI /GU: No adominal pain, vomiting, change in bowel habits. No blood in the stool. No significant GU symptoms. SKIN/HEME: ,no acute skin rashes suspicious lesions or bleeding. No lymphadenopathy, nodules, masses.  NEURO/ PSYCH:  No neurologic signs such as weakness numbness No depression anxiety. IMM/ Allergy: No unusual infections.  Allergy .   REST of 12 system review negative   Past Medical History  Diagnosis Date  . Uveitis     stable  . Hypertension   . Hx of abnormal cervical Pap smear   . History of hypothyroidism 1999    of post partum  . Colonic edema     mild left- ed eval abd ct   Past Surgical History  Procedure Date  . Cholecystectomy     1999    reports that she has never smoked. She does not have any smokeless tobacco history on file. She reports that she does not drink alcohol. Her drug history not on file. family history includes Diabetes in her brother, father, mother, and son; Hypertension in her father and mother; Lupus in her sister; Other in her sister; Rheum arthritis in her sister; and Thyroid disease in her mother. No Known Allergies     Objective:   Physical  Exam Physical Exam: Vital signs reviewed BJY:NWGN is a well-developed well-nourished alert cooperative  aa female who appears her stated age in no acute distress.  HEENT: normocephalic  traumatic , Eyes: PERRL EOM's full, conjunctiva clear, Nares: paten,t no deformity discharge or tenderness., Ears: no deformity EAC's clear TMs with normal landmarks. Mouth: clear OP, no lesions, edema.  Moist mucous membranes. Dentition in some caries front teeth no swelling or infections. NECK: supple without masses, thyromegaly or bruits. CHEST/PULM:  Clear to auscultation and percussion breath sounds equal no wheeze , rales or rhonchi. No chest wall deformities or tenderness. CV: PMI is nondisplaced, S1 S2 no gallops, murmurs, rubs. Peripheral pulses are full without delay.No JVD .   Repeat BP right 118/70 reg cuff sitting. ABDOMEN: Bowel sounds normal nontender  No guard or rebound, no hepato splenomegal no CVA tenderness.  No hernia.  Gravid uterus  At epigastrum non tender   Extremtities:  No clubbing cyanosis slight  edema, no acute joint swelling or redness no focal atrophy NEURO:  Oriented x3, cranial nerves 3-12 appear to be intact, no obvious focal weakness,gait within normal limits no abnormal reflexes or asymmetrical SKIN: No acute rashes normal turgor, color, no bruising or petechiae. PSYCH: Oriented, good eye contact, no obvious  depression anxiety, cognition and judgment appear normal. Labs reviewed with patient.  Anemia  Albumin 2.8        Assessment & Plan:  Preventive Health Care Counseled regarding healthy nutrition, exercise, sleep, injury prevention, calcium vit d and healthy weight . Disc dental care after baby borms and  Update tdap on infant caretakers  3rd trimester pregnancy  In over 40  HT now normal and off med   Aware to not take ace in preg  Hx of hypothroid  Stable today beding followed by ob  also  Disc about  Importance of euthyroid state in pregnancy for optimal fetal  development.   Anemia     Disc  Just started iron  Related to preg  No evidence of bleeding at this point  Should be followed if not comiing up.   Assume Ob will recheck .  Disc with patient.

## 2010-11-07 NOTE — Patient Instructions (Signed)
Take iron every day   As You are anemic .  Needs follow up with your OB.  Have  Dr Reinaldo Meeker review the labs from today.  If doing  well cpx with labs in a year  Monitor your blood pressure readings after pregnancy also   To make sure not rising.

## 2010-11-12 ENCOUNTER — Encounter: Payer: Self-pay | Admitting: Internal Medicine

## 2010-11-12 DIAGNOSIS — Z331 Pregnant state, incidental: Secondary | ICD-10-CM | POA: Insufficient documentation

## 2010-11-12 DIAGNOSIS — Z0000 Encounter for general adult medical examination without abnormal findings: Secondary | ICD-10-CM | POA: Insufficient documentation

## 2010-12-01 ENCOUNTER — Inpatient Hospital Stay (HOSPITAL_COMMUNITY)
Admit: 2010-12-01 | Discharge: 2010-12-01 | Disposition: A | Discharge status: Home / Self Care | Payer: 59 | Source: Ambulatory Visit | Attending: Obstetrics and Gynecology | Admitting: Obstetrics and Gynecology

## 2010-12-01 ENCOUNTER — Encounter (HOSPITAL_COMMUNITY): Payer: Self-pay | Admitting: *Deleted

## 2010-12-01 DIAGNOSIS — O479 False labor, unspecified: Secondary | ICD-10-CM

## 2010-12-01 DIAGNOSIS — R109 Unspecified abdominal pain: Secondary | ICD-10-CM | POA: Insufficient documentation

## 2010-12-01 DIAGNOSIS — O26859 Spotting complicating pregnancy, unspecified trimester: Secondary | ICD-10-CM | POA: Insufficient documentation

## 2010-12-01 HISTORY — DX: Reserved for concepts with insufficient information to code with codable children: IMO0002

## 2010-12-01 HISTORY — DX: Unspecified abnormal cytological findings in specimens from cervix uteri: R87.619

## 2010-12-01 LAB — URINALYSIS, ROUTINE W REFLEX MICROSCOPIC
Bilirubin Urine: NEGATIVE
Glucose, UA: NEGATIVE mg/dL
Ketones, ur: NEGATIVE mg/dL
Nitrite: NEGATIVE
pH: 7 (ref 5.0–8.0)

## 2010-12-01 MED ORDER — TERBUTALINE SULFATE 1 MG/ML IJ SOLN
0.2500 mg | Freq: Once | INTRAMUSCULAR | Status: AC
  Administered 2010-12-01: 0.25 mg via SUBCUTANEOUS
  Filled 2010-12-01: qty 1

## 2010-12-01 NOTE — Progress Notes (Signed)
Yesterday had pinkish d/c, today it is red. Denies recent intercourse or vag exams.  Denies LLplacenta or previa

## 2010-12-01 NOTE — ED Provider Notes (Signed)
History     Chief Complaint  Patient presents with  . Vaginal Bleeding   HPI  Past Medical History  Diagnosis Date  . Uveitis     stable  . Hypertension   . Hx of abnormal cervical Pap smear   . History of hypothyroidism 1999    of post partum  . Colonic edema     mild left- ed eval abd ct  . Abnormal Pap smear     Past Surgical History  Procedure Date  . Cholecystectomy     1999    Family History  Problem Relation Age of Onset  . Hypertension Mother   . Diabetes Mother   . Thyroid disease Mother   . Diabetes Father   . Hypertension Father   . Lupus Sister   . Rheum arthritis Sister   . Other Sister     lamb disease  . Diabetes Brother   . Diabetes Son     History  Substance Use Topics  . Smoking status: Never Smoker   . Smokeless tobacco: Never Used  . Alcohol Use: No   Presents with pain and bleeding. Mild cramping began yesterday that was mild but worsened while here.  Bleeding is spotting and pink.    Patient of Dr. Ebony Hail who is 35w 5d pregnant.   Last seen in office last week.  Called and told to come in.  Denies hx of low lying placenta or previa.   OB History    Grav Para Term Preterm Abortions TAB SAB Ect Mult Living   2 1 1  0 0 0 0 0 0 1      Review of Systems  Physical Exam  BP 114/76  Pulse 90  Temp(Src) 98.4 F (36.9 C) (Oral)  Resp 20  Ht 5\' 4"  (1.626 m)  Wt 89.359 kg (197 lb)  BMI 33.82 kg/m2  LMP 03/29/2010  Physical Exam  No active bleeding.  Pink on qtip-used to collect Bstrept. No active bleeding  Cx external os is 1cm and soft.  Internal os is closed.  No presenting part.  50% effaced.   FMS- contracting q3-4 minutes.  Reactive.   ED Course  Procedures  MDM Consulted with Dr. Senaida Ores.  Reported cervical exam, collection of Group B strept, FMS findings.  Order given to offer patient 1 injection of Terbutaline.  If patient prefers not to have medication, that is ok.  D/C home with instructions to call if contractions  increase, loss of fluid, increased bleeding, decreased fetal movement.  Keep appt. Went into the room to offer medication for contractions.  Explained to the patient and her family.  She was given time to think about the choice of medication or not.  She called out about 15 minutes later and requested the medication.  Terbutaline 0.25mg  injection  X 1 ordered  Contractions have decreased with Terbutaline.  Patient is more comfortable.  She is now asking for discharge to go back to work.Huntley Dec, RN and I have reviewed the FMS.  Uterine irritability, less contractions, Reactive.     Matt Holmes, NP 12/01/10 1643

## 2010-12-04 LAB — CULTURE, BETA STREP (GROUP B ONLY)

## 2010-12-30 ENCOUNTER — Inpatient Hospital Stay (HOSPITAL_COMMUNITY): Payer: 59 | Admitting: Anesthesiology

## 2010-12-30 ENCOUNTER — Other Ambulatory Visit: Payer: Self-pay | Admitting: Obstetrics and Gynecology

## 2010-12-30 ENCOUNTER — Encounter (HOSPITAL_COMMUNITY): Payer: Self-pay | Admitting: *Deleted

## 2010-12-30 ENCOUNTER — Encounter (HOSPITAL_COMMUNITY): Payer: Self-pay | Admitting: Anesthesiology

## 2010-12-30 ENCOUNTER — Inpatient Hospital Stay (HOSPITAL_COMMUNITY)
Admission: AD | Admit: 2010-12-30 | Discharge: 2011-01-01 | DRG: 767 | Disposition: A | Discharge status: Home / Self Care | Payer: 59 | Source: Ambulatory Visit | Attending: Obstetrics and Gynecology | Admitting: Obstetrics and Gynecology

## 2010-12-30 DIAGNOSIS — Z302 Encounter for sterilization: Secondary | ICD-10-CM

## 2010-12-30 DIAGNOSIS — O09529 Supervision of elderly multigravida, unspecified trimester: Secondary | ICD-10-CM | POA: Diagnosis present

## 2010-12-30 DIAGNOSIS — Z331 Pregnant state, incidental: Secondary | ICD-10-CM

## 2010-12-30 LAB — CBC
MCV: 78.3 fL (ref 78.0–100.0)
Platelets: 170 10*3/uL (ref 150–400)
RDW: 13.4 % (ref 11.5–15.5)
WBC: 6.8 10*3/uL (ref 4.0–10.5)

## 2010-12-30 MED ORDER — FENTANYL 2.5 MCG/ML BUPIVACAINE 1/10 % EPIDURAL INFUSION (WH - ANES): 14.0000 mL/h | INTRAMUSCULAR | Status: DC

## 2010-12-30 MED ORDER — SIMETHICONE 80 MG PO CHEW: 80.0000 mg | CHEWABLE_TABLET | ORAL | Status: DC | PRN

## 2010-12-30 MED ORDER — LIDOCAINE HCL (PF) 1 % IJ SOLN
30.0000 mL | INTRAMUSCULAR | Status: DC | PRN
  Filled 2010-12-30: qty 30

## 2010-12-30 MED ORDER — PRENATAL PLUS 27-1 MG PO TABS
1.0000 | ORAL_TABLET | Freq: Every day | ORAL | Status: DC
  Administered 2011-01-01: 1 via ORAL
  Filled 2010-12-30 (×2): qty 1

## 2010-12-30 MED ORDER — TETANUS-DIPHTH-ACELL PERTUSSIS 5-2.5-18.5 LF-MCG/0.5 IM SUSP
0.5000 mL | Freq: Once | INTRAMUSCULAR | Status: AC
  Administered 2011-01-01: 0.5 mL via INTRAMUSCULAR
  Filled 2010-12-30: qty 0.5

## 2010-12-30 MED ORDER — ZOLPIDEM TARTRATE 5 MG PO TABS
5.0000 mg | ORAL_TABLET | Freq: Every evening | ORAL | Status: DC | PRN
  Administered 2010-12-31: 5 mg via ORAL
  Filled 2010-12-30: qty 1

## 2010-12-30 MED ORDER — ONDANSETRON HCL 4 MG/2ML IJ SOLN: 4.0000 mg | Freq: Four times a day (QID) | INTRAMUSCULAR | Status: DC | PRN

## 2010-12-30 MED ORDER — DIBUCAINE 1 % RE OINT: 1.0000 "application " | TOPICAL_OINTMENT | RECTAL | Status: DC | PRN

## 2010-12-30 MED ORDER — IBUPROFEN 600 MG PO TABS
600.0000 mg | ORAL_TABLET | Freq: Four times a day (QID) | ORAL | Status: DC | PRN
  Administered 2010-12-30: 600 mg via ORAL
  Filled 2010-12-30: qty 1

## 2010-12-30 MED ORDER — IBUPROFEN 600 MG PO TABS
600.0000 mg | ORAL_TABLET | Freq: Four times a day (QID) | ORAL | Status: DC
  Administered 2010-12-31 – 2011-01-01 (×5): 600 mg via ORAL
  Filled 2010-12-30 (×5): qty 1

## 2010-12-30 MED ORDER — BENZOCAINE-MENTHOL 20-0.5 % EX AERO
1.0000 "application " | INHALATION_SPRAY | CUTANEOUS | Status: DC | PRN
  Administered 2010-12-31: 1 via TOPICAL

## 2010-12-30 MED ORDER — MAGNESIUM HYDROXIDE 400 MG/5ML PO SUSP: 30.0000 mL | ORAL | Status: DC | PRN

## 2010-12-30 MED ORDER — ONDANSETRON HCL 4 MG PO TABS: 4.0000 mg | ORAL_TABLET | ORAL | Status: DC | PRN

## 2010-12-30 MED ORDER — LACTATED RINGERS IV SOLN
INTRAVENOUS | Status: DC
  Administered 2010-12-30: 17:00:00 via INTRAVENOUS

## 2010-12-30 MED ORDER — METHYLERGONOVINE MALEATE 0.2 MG/ML IJ SOLN
0.2000 mg | INTRAMUSCULAR | Status: DC | PRN
Start: 2010-12-30 — End: 2011-01-01

## 2010-12-30 MED ORDER — EPHEDRINE 5 MG/ML INJ
10.0000 mg | INTRAVENOUS | Status: DC | PRN
  Filled 2010-12-30: qty 4

## 2010-12-30 MED ORDER — MEASLES, MUMPS & RUBELLA VAC ~~LOC~~ INJ: 0.5000 mL | INJECTION | Freq: Once | SUBCUTANEOUS | Status: DC

## 2010-12-30 MED ORDER — OXYCODONE-ACETAMINOPHEN 5-325 MG PO TABS
1.0000 | ORAL_TABLET | ORAL | Status: DC | PRN
  Administered 2010-12-31: 1 via ORAL
  Administered 2010-12-31: 2 via ORAL
  Administered 2010-12-31: 1 via ORAL
  Filled 2010-12-30 (×6): qty 2

## 2010-12-30 MED ORDER — FLEET ENEMA 7-19 GM/118ML RE ENEM: 1.0000 | ENEMA | RECTAL | Status: DC | PRN

## 2010-12-30 MED ORDER — DIPHENHYDRAMINE HCL 25 MG PO CAPS: 25.0000 mg | ORAL_CAPSULE | Freq: Four times a day (QID) | ORAL | Status: DC | PRN

## 2010-12-30 MED ORDER — LACTATED RINGERS IV SOLN
500.0000 mL | INTRAVENOUS | Status: DC | PRN
  Administered 2010-12-30: 500 mL via INTRAVENOUS

## 2010-12-30 MED ORDER — PHENYLEPHRINE 40 MCG/ML (10ML) SYRINGE FOR IV PUSH (FOR BLOOD PRESSURE SUPPORT)
PREFILLED_SYRINGE | INTRAVENOUS | Status: AC
  Administered 2010-12-30: 40 ug
  Filled 2010-12-30: qty 5

## 2010-12-30 MED ORDER — ACETAMINOPHEN 325 MG PO TABS: 650.0000 mg | ORAL_TABLET | ORAL | Status: DC | PRN

## 2010-12-30 MED ORDER — EPHEDRINE 5 MG/ML INJ
INTRAVENOUS | Status: AC
  Filled 2010-12-30: qty 4

## 2010-12-30 MED ORDER — LACTATED RINGERS IV SOLN
500.0000 mL | Freq: Once | INTRAVENOUS | Status: AC
  Administered 2010-12-30: 500 mL via INTRAVENOUS

## 2010-12-30 MED ORDER — WITCH HAZEL-GLYCERIN EX PADS: 1.0000 "application " | MEDICATED_PAD | CUTANEOUS | Status: DC | PRN

## 2010-12-30 MED ORDER — SENNOSIDES-DOCUSATE SODIUM 8.6-50 MG PO TABS
2.0000 | ORAL_TABLET | Freq: Every day | ORAL | Status: DC
  Administered 2010-12-30 – 2010-12-31 (×2): 2 via ORAL

## 2010-12-30 MED ORDER — DIPHENHYDRAMINE HCL 50 MG/ML IJ SOLN: 12.5000 mg | INTRAMUSCULAR | Status: DC | PRN

## 2010-12-30 MED ORDER — LANOLIN HYDROUS EX OINT: TOPICAL_OINTMENT | CUTANEOUS | Status: DC | PRN

## 2010-12-30 MED ORDER — PHENYLEPHRINE 40 MCG/ML (10ML) SYRINGE FOR IV PUSH (FOR BLOOD PRESSURE SUPPORT)
80.0000 ug | PREFILLED_SYRINGE | INTRAVENOUS | Status: DC | PRN
  Filled 2010-12-30: qty 5

## 2010-12-30 MED ORDER — ONDANSETRON HCL 4 MG/2ML IJ SOLN: 4.0000 mg | INTRAMUSCULAR | Status: DC | PRN

## 2010-12-30 MED ORDER — PRENATAL PLUS 27-1 MG PO TABS: 1.0000 | ORAL_TABLET | Freq: Every day | ORAL | Status: DC

## 2010-12-30 MED ORDER — LACTATED RINGERS IV SOLN
INTRAVENOUS | Status: DC
  Administered 2010-12-31 (×2): via INTRAVENOUS

## 2010-12-30 MED ORDER — FENTANYL 2.5 MCG/ML BUPIVACAINE 1/10 % EPIDURAL INFUSION (WH - ANES)
INTRAMUSCULAR | Status: AC
  Filled 2010-12-30: qty 60

## 2010-12-30 MED ORDER — OXYTOCIN BOLUS FROM INFUSION
500.0000 mL | Freq: Once | INTRAVENOUS | Status: DC
  Filled 2010-12-30: qty 1000
  Filled 2010-12-30: qty 500

## 2010-12-30 MED ORDER — CITRIC ACID-SODIUM CITRATE 334-500 MG/5ML PO SOLN: 30.0000 mL | ORAL | Status: DC | PRN

## 2010-12-30 MED ORDER — OXYTOCIN 20 UNITS IN LACTATED RINGERS INFUSION - SIMPLE
125.0000 mL/h | INTRAVENOUS | Status: DC
  Administered 2010-12-30: 125 mL/h via INTRAVENOUS

## 2010-12-30 MED ORDER — FENTANYL 2.5 MCG/ML BUPIVACAINE 1/10 % EPIDURAL INFUSION (WH - ANES)
INTRAMUSCULAR | Status: DC | PRN
  Administered 2010-12-30: 14 mL/h via EPIDURAL

## 2010-12-30 MED ORDER — LIDOCAINE HCL 1.5 % IJ SOLN
INTRAMUSCULAR | Status: DC | PRN
  Administered 2010-12-30 (×2): 5 mL via EPIDURAL

## 2010-12-30 MED ORDER — OXYCODONE-ACETAMINOPHEN 5-325 MG PO TABS: 2.0000 | ORAL_TABLET | ORAL | Status: DC | PRN

## 2010-12-30 MED ORDER — METHYLERGONOVINE MALEATE 0.2 MG PO TABS: 0.2000 mg | ORAL_TABLET | ORAL | Status: DC | PRN

## 2010-12-30 NOTE — Progress Notes (Signed)
Delivery of live viable female by Dr Jackelyn Knife. APGARS 9 9

## 2010-12-30 NOTE — H&P (Signed)
Monica Estrada, Monica Estrada                 ACCOUNT NO.:  [id number]  MEDICAL RECORD NO.:  [id number]  LOCATION:  9174                          FACILITY:  [facility]  PHYSICIAN:  Zenaida Niece, M.D.DATE OF BIRTH:  Sep 05, 1965  DATE OF ADMISSION:  12/30/2010 DATE OF DISCHARGE:                             HISTORY & PHYSICAL   CHIEF COMPLAINT:  Labor.  HISTORY OF PRESENT ILLNESS:  This is a 45 year old gravida 3, para 1-0-1- 1 with an EGA of 39 plus weeks by an early ultrasound with a due date of August 18, who presents to the office with the complaint of regular contractions.  Evaluation in the office revealed her cervix to be 3 cm dilated, 90% effaced, and she was having regular contractions.  She was sent to Labor and Delivery for direct admission.  Prenatal care has been essentially uncomplicated and she has had reactive nonstress test due to advanced maternal age.  Please see prenatal records for full pregnancy history.  PRENATAL LABS:  Blood type is A negative with a negative antibody screen.  Rubella immune, hepatitis B surface antigen negative, HIV negative.  Hemoglobin electrophoresis reveals hemoglobin AC.  Genetic testing was declined.  GCT was normal twice.  Group B strep is negative.  PAST OB HISTORY:  In 1998, vaginal delivery at 39 weeks, 8 pounds 13 ounces, complicated by gestational diabetes.  In 1987, she had an elective termination.  PAST MEDICAL HISTORY:  Hypertension and hemoglobin AC.  PAST SURGICAL HISTORY:  Eye surgery in 1983.  ALLERGIES:  None known.  CURRENT MEDICATIONS:  None.  FAMILY HISTORY:  No GYN or colon cancer.  The patient's father had sickle cell trait.  Mother with hypertension.  SOCIAL HISTORY:  She is single and denies alcohol, tobacco or drug use.  REVIEW OF SYSTEMS:  Normal complaints of pregnancy.  PHYSICAL EXAMINATION:  GENERAL:  This is a well-developed gravid female in no acute distress. VITAL SIGNS:  Weight is 203 pounds, blood pressure  120/80. NECK:  Supple without lymphadenopathy or thyromegaly. LUNGS: Clear to auscultation. HEART:  Regular rate and rhythm without murmur. ABDOMEN:  Gravid, nontender with an appropriate fundal height and an estimated fetal weight of8-1/2 pounds. EXTREMITIES:  1+ edema and are nontender.  Cervix again is 3, 90, - 2 vertex presentation.  ASSESSMENT:  Intrauterine pregnancy at 39 plus weeks in early labor.  PLAN:  Plan is to admit the patient to Labor And Delivery with expecting a vaginal delivery.     Zenaida Niece, M.D.     TDM/MEDQ  D:  12/30/2010  T:  12/30/2010  Job:  [id number]

## 2010-12-30 NOTE — Progress Notes (Signed)
Comfortable with epidural Afeb, VSS FHT- Cat II, min-mod variability, some variables-improved, ctx q 2-3 min VE- C/C/-1 per RN Start pushing soon and anticipate SVD I reviewed pts history, we have no history of abuse or psych problems

## 2010-12-30 NOTE — Progress Notes (Signed)
Dr Jackelyn Knife notified of admission status, FHR tracing, UCs, SVE, assessment and psychosocial response. No orders given at this tiem

## 2010-12-30 NOTE — Anesthesia Postprocedure Evaluation (Signed)
Anesthesia Post Note  Patient: Monica Estrada  Procedure(s) Performed: * No procedures listed *  Anesthesia type: Epidural  Patient location: Mother/Baby  Post pain: Pain level controlled  Post assessment: Post-op Vital signs reviewed  Last Vitals:  Filed Vitals:   12/30/10 1925  BP: 118/66  Pulse: 96  Temp:   Resp: 16    Post vital signs: Reviewed  Level of consciousness: awake  Complications: No apparent anesthesia complications

## 2010-12-30 NOTE — Progress Notes (Signed)
At bedside assessing pt

## 2010-12-30 NOTE — Progress Notes (Signed)
Attempting to admit/assess at bedside, pt not responding to questions asked and has flat affect. Pt states, " I feel different and weird." When asked to describe the change; no response. Pt appears to understand ques, BP, HR, lungs sounds, and, FHR appear to be WDL. No visitors/family present. OBRR paged to further assess patient. Dr Ellyn Hack and Dr Jackelyn Knife paged for further hx.

## 2010-12-30 NOTE — Anesthesia Preprocedure Evaluation (Signed)
Anesthesia Evaluation  Name, MR# and DOB Patient awake  General Assessment Comment  Reviewed: Allergy & Precautions, H&P , NPO status , Patient's Chart, lab work & pertinent test results  Airway Mallampati: II TM Distance: >3 FB Neck ROM: full    Dental No notable dental hx.    Pulmonary  clear to auscultation  pulmonary exam normalPulmonary Exam Normal breath sounds clear to auscultation none    Cardiovascular     Neuro/Psych Negative Neurological ROS  Negative Psych ROS  GI/Hepatic/Renal negative GI ROS, negative Liver ROS, and negative Renal ROS (+)       Endo/Other  Negative Endocrine ROS (+)      Abdominal Normal abdominal exam  (+)   Musculoskeletal negative musculoskeletal ROS (+)   Hematology negative hematology ROS (+)   Peds  Reproductive/Obstetrics (+) Pregnancy    Anesthesia Other Findings             Anesthesia Physical  Anesthesia Plan  ASA: II  Anesthesia Plan: Epidural   Post-op Pain Management:    Induction:   Airway Management Planned:   Additional Equipment:   Intra-op Plan:   Post-operative Plan:   Informed Consent: I have reviewed the patients History and Physical, chart, labs and discussed the procedure including the risks, benefits and alternatives for the proposed anesthesia with the patient or authorized representative who has indicated his/her understanding and acceptance.     Plan Discussed with:   Anesthesia Plan Comments:         Anesthesia Quick Evaluation  

## 2010-12-30 NOTE — Anesthesia Procedure Notes (Addendum)
Epidural Patient location during procedure: OB Start time: 12/30/2010 4:39 PM End time: 12/30/2010 4:51 PM Reason for block: procedure for pain  Staffing Anesthesiologist: Sandrea Hughs  Preanesthetic Checklist Completed: patient identified, site marked, surgical consent, pre-op evaluation, timeout performed, IV checked, risks and benefits discussed and monitors and equipment checked  Epidural Patient position: sitting Prep: site prepped and draped and DuraPrep Patient monitoring: continuous pulse ox and blood pressure Approach: midline Injection technique: LOR air  Needle:  Needle type: Tuohy  Needle gauge: 17 G Needle length: 9 cm Needle insertion depth: 5 cm cm Catheter type: closed end flexible Catheter size: 19 Gauge Catheter at skin depth: 10 cm Test dose: negative  Assessment Sensory level: T10 Events: blood not aspirated, injection not painful, no injection resistance, negative IV test and no paresthesia

## 2010-12-31 ENCOUNTER — Encounter (HOSPITAL_COMMUNITY): Payer: Self-pay | Admitting: Anesthesiology

## 2010-12-31 ENCOUNTER — Encounter (HOSPITAL_COMMUNITY)
Admission: AD | Disposition: A | Discharge status: Home / Self Care | Payer: Self-pay | Source: Ambulatory Visit | Attending: Obstetrics and Gynecology

## 2010-12-31 ENCOUNTER — Other Ambulatory Visit: Payer: Self-pay | Admitting: Obstetrics and Gynecology

## 2010-12-31 ENCOUNTER — Inpatient Hospital Stay (HOSPITAL_COMMUNITY): Payer: 59 | Admitting: Anesthesiology

## 2010-12-31 HISTORY — PX: TUBAL LIGATION: SHX77

## 2010-12-31 LAB — CBC
MCV: 77.6 fL — ABNORMAL LOW (ref 78.0–100.0)
Platelets: 164 10*3/uL (ref 150–400)
RBC: 3.62 MIL/uL — ABNORMAL LOW (ref 3.87–5.11)
WBC: 11.8 10*3/uL — ABNORMAL HIGH (ref 4.0–10.5)

## 2010-12-31 LAB — SURGICAL PCR SCREEN
MRSA, PCR: NEGATIVE
Staphylococcus aureus: NEGATIVE

## 2010-12-31 LAB — ABO/RH: ABO/RH(D): A NEG

## 2010-12-31 SURGERY — LIGATION, FALLOPIAN TUBE, BILATERAL
Anesthesia: Epidural | Site: Abdomen | Laterality: Bilateral | Wound class: Clean Contaminated

## 2010-12-31 MED ORDER — MIDAZOLAM HCL 2 MG/2ML IJ SOLN
INTRAMUSCULAR | Status: AC
  Filled 2010-12-31: qty 2

## 2010-12-31 MED ORDER — RHO D IMMUNE GLOBULIN 1500 UNIT/2ML IJ SOLN
300.0000 ug | Freq: Once | INTRAMUSCULAR | Status: AC
  Administered 2010-12-31: 300 ug via INTRAMUSCULAR
  Filled 2010-12-31: qty 2

## 2010-12-31 MED ORDER — LIDOCAINE-EPINEPHRINE (PF) 2 %-1:200000 IJ SOLN
INTRAMUSCULAR | Status: AC
  Filled 2010-12-31: qty 20

## 2010-12-31 MED ORDER — EPHEDRINE 5 MG/ML INJ
INTRAVENOUS | Status: AC
  Filled 2010-12-31: qty 10

## 2010-12-31 MED ORDER — BENZOCAINE-MENTHOL 20-0.5 % EX AERO
INHALATION_SPRAY | CUTANEOUS | Status: AC
  Filled 2010-12-31: qty 56

## 2010-12-31 MED ORDER — MEPERIDINE HCL 25 MG/ML IJ SOLN
INTRAMUSCULAR | Status: DC | PRN
  Administered 2010-12-31 (×2): 12.5 mg via INTRAVENOUS

## 2010-12-31 MED ORDER — SODIUM CHLORIDE 0.9 % IR SOLN
Status: DC | PRN
  Administered 2010-12-31: 1000 mL

## 2010-12-31 MED ORDER — FAMOTIDINE 20 MG PO TABS
40.0000 mg | ORAL_TABLET | Freq: Once | ORAL | Status: AC
  Administered 2010-12-31: 20 mg via ORAL
  Filled 2010-12-31 (×2): qty 1

## 2010-12-31 MED ORDER — FENTANYL CITRATE 0.05 MG/ML IJ SOLN
INTRAMUSCULAR | Status: AC
  Filled 2010-12-31: qty 2

## 2010-12-31 MED ORDER — MIDAZOLAM HCL 5 MG/5ML IJ SOLN
INTRAMUSCULAR | Status: DC | PRN
  Administered 2010-12-31 (×2): 1 mg via INTRAVENOUS

## 2010-12-31 MED ORDER — BUPIVACAINE HCL (PF) 0.25 % IJ SOLN
INTRAMUSCULAR | Status: DC | PRN
  Administered 2010-12-31: 6 mL

## 2010-12-31 MED ORDER — FENTANYL CITRATE 0.05 MG/ML IJ SOLN
INTRAMUSCULAR | Status: DC | PRN
  Administered 2010-12-31 (×2): 50 ug via INTRAVENOUS

## 2010-12-31 MED ORDER — KETOROLAC TROMETHAMINE 30 MG/ML IJ SOLN
INTRAMUSCULAR | Status: AC
  Filled 2010-12-31: qty 1

## 2010-12-31 MED ORDER — FENTANYL CITRATE 0.05 MG/ML IJ SOLN: 1.0000 ug/kg | INTRAMUSCULAR | Status: DC | PRN

## 2010-12-31 MED ORDER — SODIUM BICARBONATE 8.4 % IV SOLN
INTRAVENOUS | Status: DC | PRN
  Administered 2010-12-31: 3 mL via EPIDURAL

## 2010-12-31 MED ORDER — METOCLOPRAMIDE HCL 10 MG PO TABS
10.0000 mg | ORAL_TABLET | Freq: Once | ORAL | Status: AC
  Administered 2010-12-31: 10 mg via ORAL
  Filled 2010-12-31: qty 1

## 2010-12-31 MED ORDER — EPHEDRINE SULFATE 50 MG/ML IJ SOLN
INTRAMUSCULAR | Status: DC | PRN
  Administered 2010-12-31: 10 mg via INTRAVENOUS

## 2010-12-31 MED ORDER — SODIUM BICARBONATE 8.4 % IV SOLN
INTRAVENOUS | Status: AC
  Filled 2010-12-31: qty 50

## 2010-12-31 MED ORDER — MEPERIDINE HCL 25 MG/ML IJ SOLN
INTRAMUSCULAR | Status: AC
  Filled 2010-12-31: qty 1

## 2010-12-31 MED ORDER — SODIUM BICARBONATE 8.4 % IV SOLN: INTRAVENOUS | Status: DC | PRN

## 2010-12-31 MED ORDER — KETOROLAC TROMETHAMINE 30 MG/ML IJ SOLN
INTRAMUSCULAR | Status: DC | PRN
  Administered 2010-12-31: 30 mg via INTRAVENOUS

## 2010-12-31 SURGICAL SUPPLY — 20 items
BLADE SURG 11 STRL SS (BLADE) ×2 IMPLANT
CHLORAPREP W/TINT 26ML (MISCELLANEOUS) ×2 IMPLANT
CLOTH BEACON ORANGE TIMEOUT ST (SAFETY) ×2 IMPLANT
CONTAINER PREFILL 10% NBF 15ML (MISCELLANEOUS) ×4 IMPLANT
DRSG COVADERM PLUS 2X2 (GAUZE/BANDAGES/DRESSINGS) ×2 IMPLANT
GLOVE BIO SURGEON STRL SZ8 (GLOVE) ×2 IMPLANT
GLOVE ORTHO TXT STRL SZ7.5 (GLOVE) ×2 IMPLANT
GOWN PREVENTION PLUS LG XLONG (DISPOSABLE) ×2 IMPLANT
GOWN STRL REIN XL XLG (GOWN DISPOSABLE) ×2 IMPLANT
NEEDLE HYPO 25X1 1.5 SAFETY (NEEDLE) ×2 IMPLANT
NS IRRIG 1000ML POUR BTL (IV SOLUTION) ×2 IMPLANT
PACK ABDOMINAL MINOR (CUSTOM PROCEDURE TRAY) ×2 IMPLANT
SPONGE LAP 4X18 X RAY DECT (DISPOSABLE) ×2 IMPLANT
SUT PLAIN 0 NONE (SUTURE) ×2 IMPLANT
SUT VIC AB 0 CT1 27 (SUTURE) ×1
SUT VIC AB 0 CT1 27XBRD ANBCTR (SUTURE) ×1 IMPLANT
SUT VICRYL RAPIDE 4/0 PS 2 (SUTURE) ×2 IMPLANT
SYR CONTROL 10ML LL (SYRINGE) ×2 IMPLANT
TOWEL OR 17X24 6PK STRL BLUE (TOWEL DISPOSABLE) ×4 IMPLANT
TRAY FOLEY CATH 14FR (SET/KITS/TRAYS/PACK) ×2 IMPLANT

## 2010-12-31 NOTE — Anesthesia Postprocedure Evaluation (Signed)
  Anesthesia Post-op Note  Patient: Monica Estrada  Procedure(s) Performed:  BILATERAL TUBAL LIGATION - Bilateral post partum tubal ligation  Patient Location: PACU and Women[facility] Unit  Anesthesia Type: Epidural  Level of Consciousness: awake, alert  and oriented  Airway and Oxygen Therapy: Patient Spontanous Breathing  Post-op Pain: none  Post-op Assessment: Patient's Cardiovascular Status Stable  Post-op Vital Signs: stable  Complications: No apparent anesthesia complications

## 2010-12-31 NOTE — Progress Notes (Signed)
PPD#1 No problems, wants BTL Afeb, VSS Fundus- firm, NT at U-0 Hgb 10.9 to 9.7 Continue routine care, for BTL at 1030.

## 2010-12-31 NOTE — Transfer of Care (Signed)
Immediate Anesthesia Transfer of Care Note  Patient: Monica Estrada  Procedure(s) Performed:  BILATERAL TUBAL LIGATION - Bilateral post partum tubal ligation  Patient Location: PACU  Anesthesia Type: Epidural  Level of Consciousness: awake, alert  and oriented  Airway & Oxygen Therapy: Patient Spontanous Breathing and Patient connected to nasal cannula oxygen  Post-op Assessment: Report given to PACU RN and Post -op Vital signs reviewed and stable  Post vital signs: Reviewed and stable  Complications: No apparent anesthesia complications

## 2010-12-31 NOTE — Anesthesia Preprocedure Evaluation (Addendum)
Anesthesia Evaluation  Name, MR# and DOB Patient awake  General Assessment Comment  Reviewed: Allergy & Precautions, H&P , NPO status , Patient's Chart, lab work & pertinent test results  Airway Mallampati: II TM Distance: >3 FB Neck ROM: full    Dental No notable dental hx.    Pulmonary  clear to auscultation  pulmonary exam normalPulmonary Exam Normal breath sounds clear to auscultation none    Cardiovascular     Neuro/Psych Negative Neurological ROS  Negative Psych ROS  GI/Hepatic/Renal negative GI ROS, negative Liver ROS, and negative Renal ROS (+)       Endo/Other  Negative Endocrine ROS (+)      Abdominal Normal abdominal exam  (+)   Musculoskeletal negative musculoskeletal ROS (+)   Hematology negative hematology ROS (+)   Peds  Reproductive/Obstetrics (+) Pregnancy    Anesthesia Other Findings             Anesthesia Physical  Anesthesia Plan  ASA: II  Anesthesia Plan: Epidural   Post-op Pain Management:    Induction:   Airway Management Planned:   Additional Equipment:   Intra-op Plan:   Post-operative Plan:   Informed Consent: I have reviewed the patients History and Physical, chart, labs and discussed the procedure including the risks, benefits and alternatives for the proposed anesthesia with the patient or authorized representative who has indicated his/her understanding and acceptance.     Plan Discussed with:   Anesthesia Plan Comments:         Anesthesia Quick Evaluation

## 2010-12-31 NOTE — Op Note (Signed)
Preoperative diagnosis: Desires surgical sterility, postpartum Postoperative diagnosis: Same Procedure: Postpartum bilateral partial salpingectomy Surgeon: Lavina Hamman M.D. Anesthesia: Epidural Findings: Patient had normal postpartum anatomy Estimated blood loss: Minimal Complications: None Procedure in detail: The patient was taken to the operating room and placed in the dorsosupine position. Her previously placed epidural was dosed appropriately. Abdomen was prepped and draped in the usual sterile fashion and a Foley catheter was placed. Infraumbilical skin was infiltrated with quarter percent Marcaine after the level of her anesthesia was found to be adequate. A 3 cm horizontal incision was made and carried down to the fascia. The fascia was elevated and entered sharply. Peritoneum was then likewise elevated and entered sharply. A moist lap pad was used to retract the bowel superiorly. Both fallopian tubes were identified and traced to their fimbriated ends. The middle portion of each tube was elevated with a Babcock clamp. A knuckle of tube was ligated with 0 plain gut suture on each side. These knuckles of tube were removed sharply. On both sides both tubal ostia were identified and the stumps were hemostatic. The lap pad was removed. Fascia was closed with running 0 Vicryl. Skin was closed with running subcuticular suture of 4-0 Vicryl followed by a Band-Aid. The patient tolerated the procedure well and was taken to the recovery in stable condition. Counts were correct, she had PAS hose on throughout the procedure.

## 2011-01-01 LAB — RH IG WORKUP (INCLUDES ABO/RH)
ABO/RH(D): A NEG
Antibody Screen: NEGATIVE
Fetal Screen: NEGATIVE
Unit division: 0

## 2011-01-01 MED ORDER — IBUPROFEN 600 MG PO TABS: 600.0000 mg | ORAL_TABLET | Freq: Four times a day (QID) | ORAL | Status: AC

## 2011-01-01 MED ORDER — OXYCODONE-ACETAMINOPHEN 5-325 MG PO TABS: 1.0000 | ORAL_TABLET | ORAL | Status: AC | PRN

## 2011-01-01 NOTE — Discharge Summary (Signed)
Obstetric Discharge Summary Reason for Admission: onset of labor Prenatal Procedures: none Intrapartum Procedures: spontaneous vaginal delivery Postpartum Procedures: P.P. tubal ligation Complications-Operative and Postpartum: none Hemoglobin  Date Value Range Status  12/31/2010 9.7* 12.0-15.0 (g/dL) Final     HCT  Date Value Range Status  12/31/2010 28.1* 36.0-46.0 (%) Final    Discharge Diagnoses: Term Pregnancy-delivered  Discharge Information: Date: 01/01/2011 Activity: no strenuous activity Diet: routine Medications: Ibuprophen and Percocet Condition: stable Instructions: refer to practice specific booklet Discharge to: home Follow-up Information    Follow up with Bradley [facility] D. Call in 6 weeks.   Contact information:   [address], [address] [place] [address] [phone]          Newborn Data: Live born female  Birth Weight: 8 lb 3.9 oz (3740 g) APGAR: 9, 9  Home with mother.  Rekia Kujala D 01/01/2011, 9:30 AM

## 2011-01-01 NOTE — Anesthesia Postprocedure Evaluation (Signed)
  Anesthesia Post-op Note  Patient: Monica Estrada  Procedure(s) Performed:  BILATERAL TUBAL LIGATION - Bilateral post partum tubal ligation  Patient Location: PACU and Mother/Baby  Anesthesia Type: Epidural  Level of Consciousness: awake, alert  and oriented  Airway and Oxygen Therapy: Patient Spontanous Breathing  Post-op Assessment: Post-op Vital signs reviewed and Patient's Cardiovascular Status Stable  Post-op Vital Signs: Reviewed and stable  Complications: No apparent anesthesia complications

## 2011-01-01 NOTE — Discharge Summary (Signed)
PPD#2, POD#1 PPBTL Doing ok, pain ok Afeb, VSS Abd- soft, fundus firm, dressing C/D/I D/c home

## 2011-01-23 ENCOUNTER — Encounter (HOSPITAL_COMMUNITY): Payer: Self-pay | Admitting: Obstetrics and Gynecology

## 2011-07-31 IMAGING — CT CT ABD-PELV W/ CM
2 of 5 series · 17 of 46 positions shown, 19 images · IV contrast (APPLIED)
Comparison: None.

CLINICAL DATA: Suprapubic pain and cramping.  Abdominal pain.

CT ABDOMEN AND PELVIS WITH CONTRAST
TECHNIQUE: Multidetector CT imaging of the abdomen and pelvis was
performed following the standard protocol during bolus
administration of intravenous contrast.
Contrast: 125 ml Tmnipaque-QXX

[Series 2: abd_pel 5.0 b40f st · axial · 0.68mm/px · z∈[-439,-54]mm · 14 of 87 slices shown, 16 images]
[im 5/87  soft-tissue]
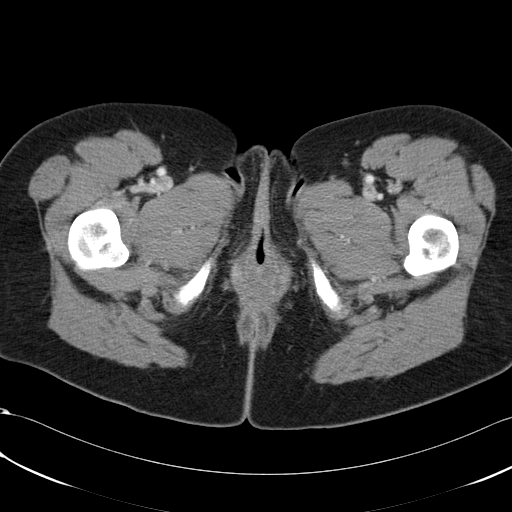
[im 5/87  bone]
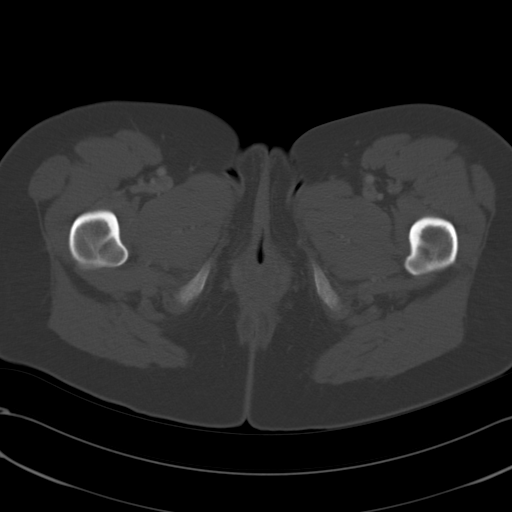
[im 13/87  soft-tissue]
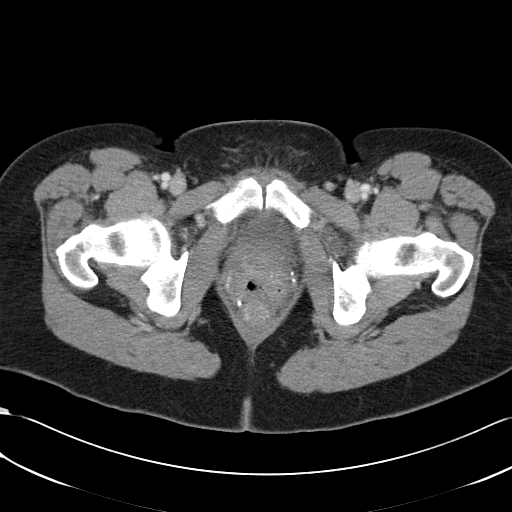
[im 18/87  soft-tissue]
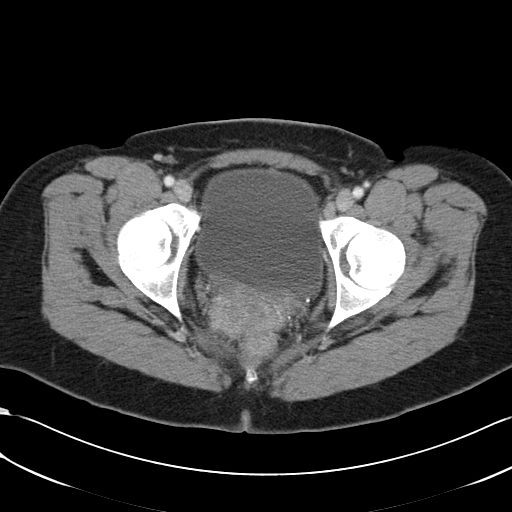
[im 22/87  soft-tissue]
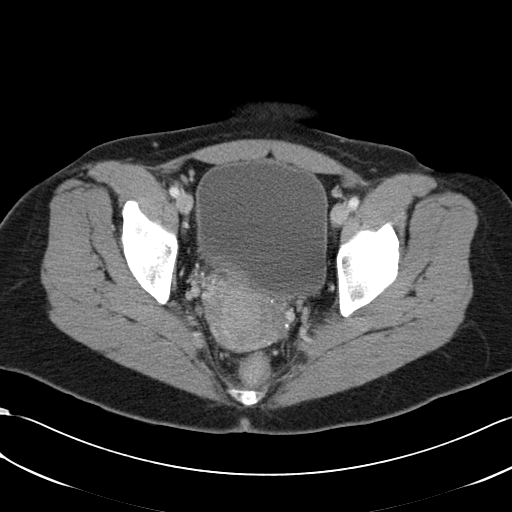
[im 31/87  soft-tissue]
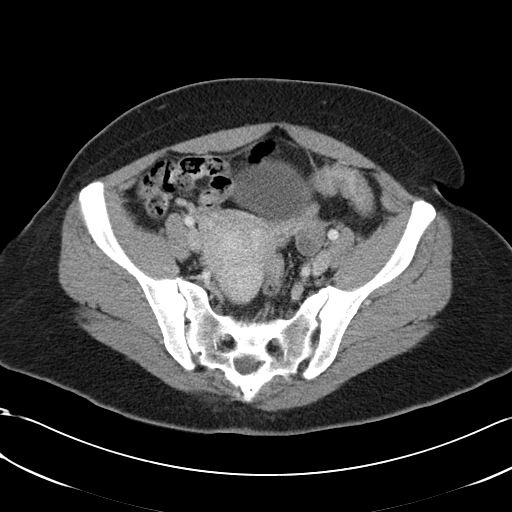
[im 35/87  soft-tissue]
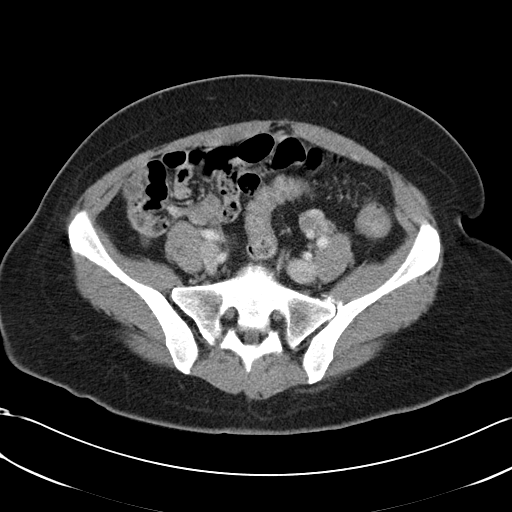
[im 39/87  soft-tissue]
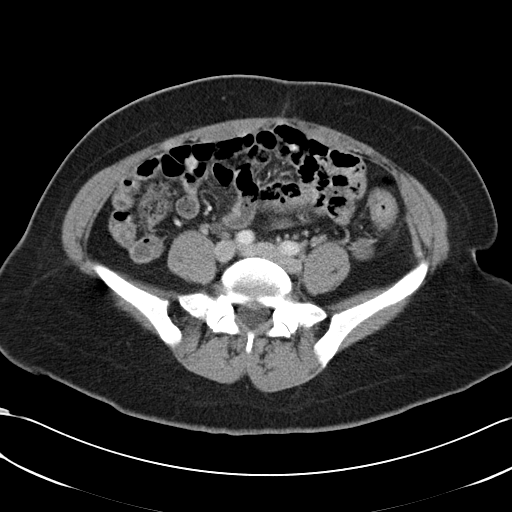
[im 48/87  soft-tissue]
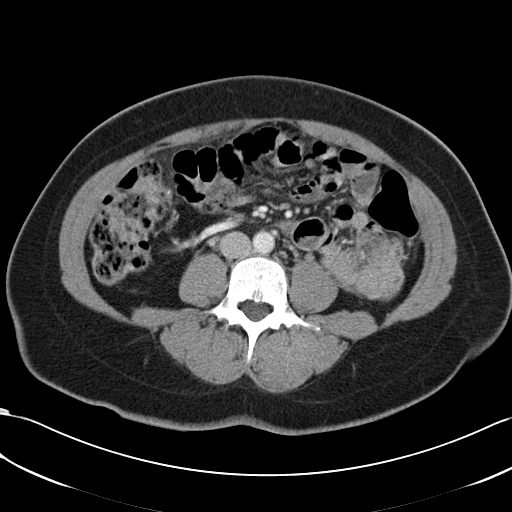
[im 52/87  soft-tissue]
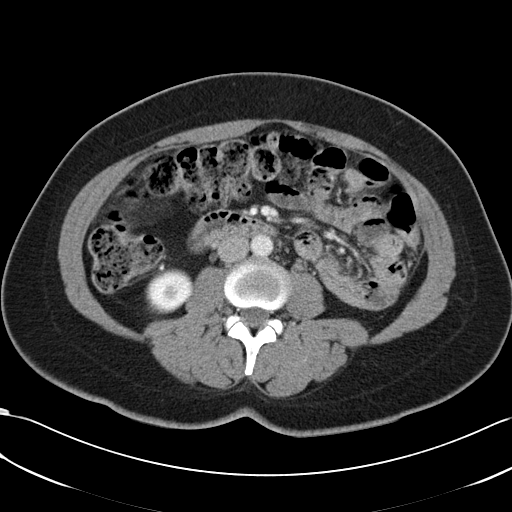
[im 52/87  bone]
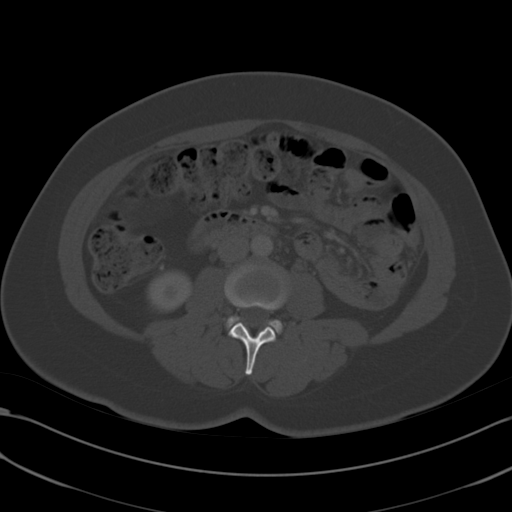
[im 56/87  soft-tissue]
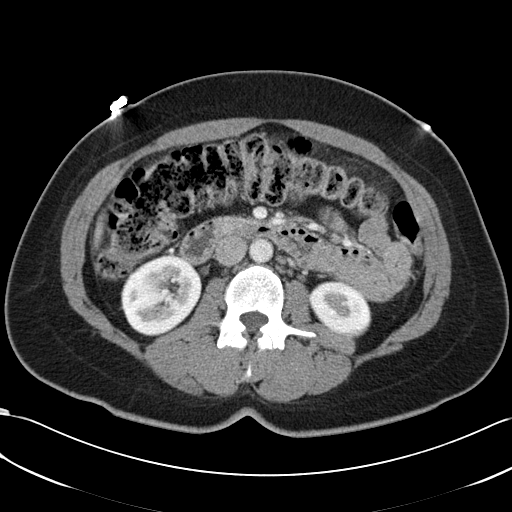
[im 65/87  soft-tissue]
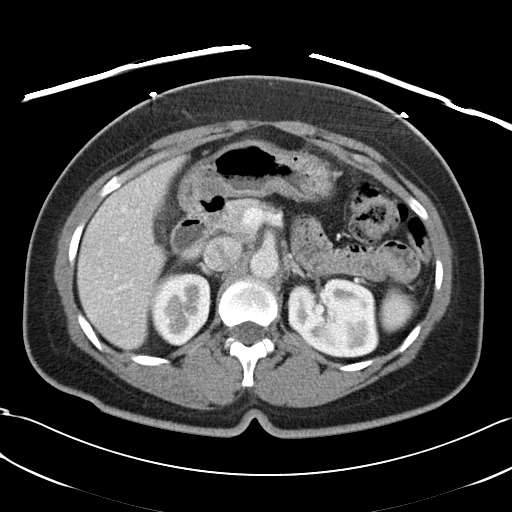
[im 69/87  soft-tissue]
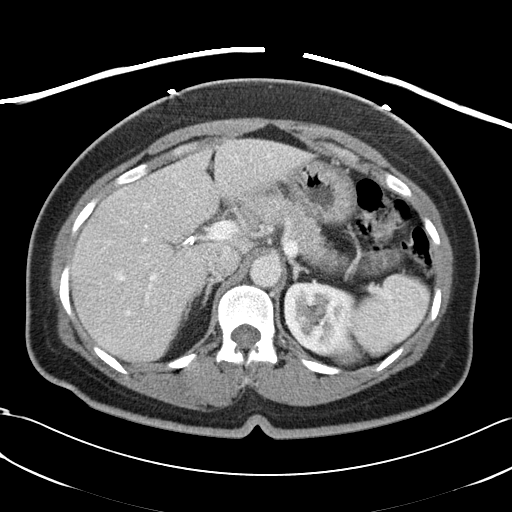
[im 74/87  soft-tissue]
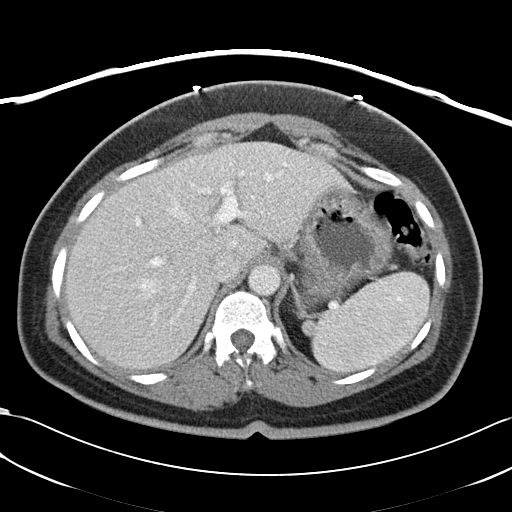
[im 82/87  soft-tissue]
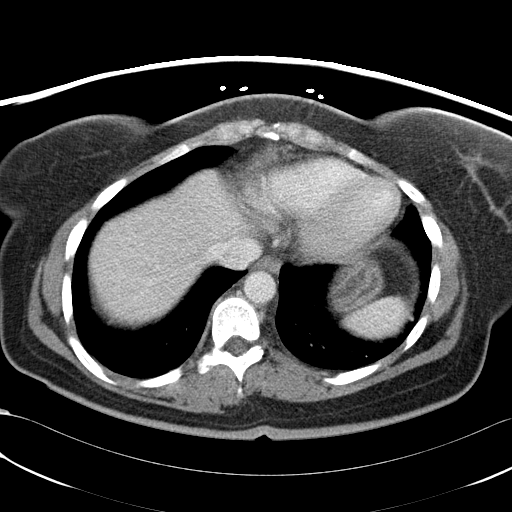

[Series 602: coronal · coronal · 0.88mm/px · 3 of 64 slices shown]
[im 22/64  soft-tissue]
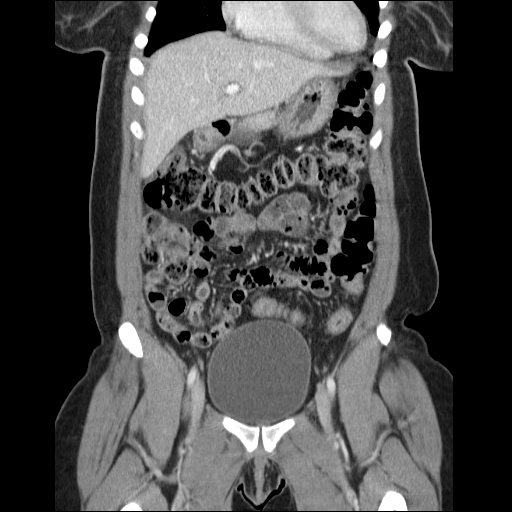
[im 29/64  soft-tissue]
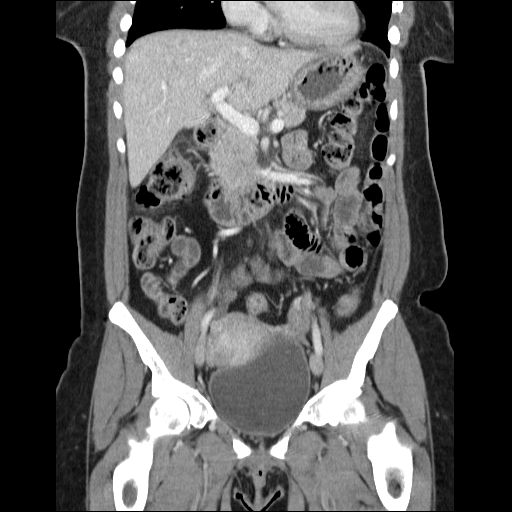
[im 36/64  soft-tissue]
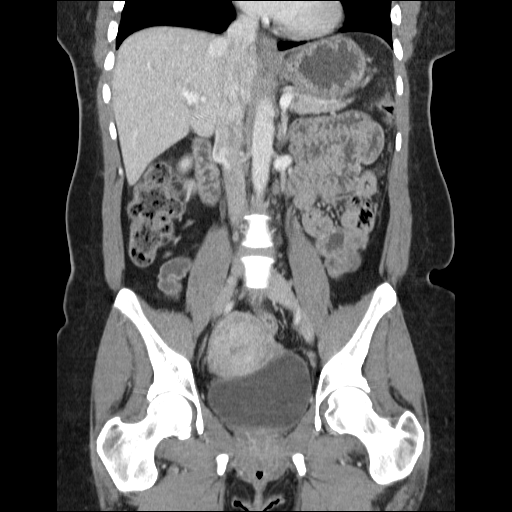

[17 of 46 positions shown; findings below may reference images not displayed]

FINDINGS: No focal abnormality in the liver or spleen.  The
stomach, duodenum pancreas, adrenal glands, and kidneys have normal
imaging features.  There is some trace edema or inflammation
adjacent to the hepatic flexure.

No abdominal aortic aneurysm.  No abdominal lymphadenopathy.

Imaging through the pelvis shows no evidence for pelvic
lymphadenopathy.  Uterus is unremarkable.  There is no adnexal
mass.  No free fluid in the pelvis.  The bladder has normal imaging
features.  The terminal ileum and the appendix are normal.
IMPRESSION: Apparent trace edema adjacent to the hepatic flexure of the colon.
While this is not a definite finding, a mild degree of focal
colitis or diverticulitis of the right colon is a possibility.  No
underlying colonic lesion is evident by CT.  Omental infarct would
be a possibility.

No changes in the pelvis to explain the patient's history of
suprapubic pain.

## 2011-11-23 ENCOUNTER — Telehealth: Payer: Self-pay | Admitting: Internal Medicine

## 2011-11-23 NOTE — Telephone Encounter (Signed)
Caller: [name]/Patient; Phone Number: [phone]; Message from caller: Pt.  Calling about a refill on her B/P meds.  Same not seen in EPIC, states that she stopped taking them during her pregnancy.  No recent OV.

## 2011-11-24 NOTE — Telephone Encounter (Signed)
Left message for the pt to call back.  Will need CPX 30 min and labs.  Pt needs to be notified.

## 2011-11-27 ENCOUNTER — Telehealth: Payer: Self-pay | Admitting: Internal Medicine

## 2011-11-27 NOTE — Telephone Encounter (Signed)
Pt returned your call and is scheduled for appt next week. Pt requesting blood pressure med be called into pharmacy. Pt could not remember name of medication [facility]

## 2011-11-27 NOTE — Telephone Encounter (Signed)
Agree with getting visit  . What med is she on ?  And what is her BP reading?  We could refill until the visit

## 2011-11-27 NOTE — Telephone Encounter (Signed)
See other task

## 2011-11-27 NOTE — Telephone Encounter (Signed)
I had the pt schedule a cpx with you as she has not been seen in over 1 year.  She is requesting refills of her bp medication.  Has not been on this in some time.  Please advise.  Thanks!!!

## 2011-11-28 NOTE — Telephone Encounter (Signed)
Tried reaching the pt at her home number.  No answering machine available.  Will try again at a later time.

## 2011-11-29 ENCOUNTER — Other Ambulatory Visit (INDEPENDENT_AMBULATORY_CARE_PROVIDER_SITE_OTHER): Payer: 59

## 2011-11-29 DIAGNOSIS — Z0000 Encounter for general adult medical examination without abnormal findings: Secondary | ICD-10-CM

## 2011-11-29 LAB — BASIC METABOLIC PANEL
CO2: 27 mEq/L (ref 19–32)
GFR: 113.53 mL/min (ref >60.00)
Glucose, Bld: 90 mg/dL (ref 70–99)
Potassium: 3.7 mEq/L (ref 3.5–5.1)
Sodium: 139 mEq/L (ref 135–145)

## 2011-11-29 LAB — LIPID PANEL
Cholesterol: 197 mg/dL (ref 0–200)
VLDL: 10.8 mg/dL (ref 0.0–40.0)

## 2011-11-29 LAB — CBC WITH DIFFERENTIAL/PLATELET
Basophils Absolute: 0 10*3/uL (ref 0.0–0.1)
Basophils Relative: 0.4 % (ref 0.0–3.0)
Eosinophils Absolute: 0.1 10*3/uL (ref 0.0–0.7)
Eosinophils Relative: 2.8 % (ref 0.0–5.0)
HCT: 35.1 % — ABNORMAL LOW (ref 36.0–46.0)
Hemoglobin: 11 g/dL — ABNORMAL LOW (ref 12.0–15.0)
Lymphocytes Relative: 38.4 % (ref 12.0–46.0)
Lymphs Abs: 1.3 10*3/uL (ref 0.7–4.0)
MCHC: 31.3 g/dL (ref 30.0–36.0)
MCV: 78.7 fl (ref 78.0–100.0)
Monocytes Absolute: 0.3 10*3/uL (ref 0.1–1.0)
Monocytes Relative: 7.9 % (ref 3.0–12.0)
Neutro Abs: 1.7 10*3/uL (ref 1.4–7.7)
Neutrophils Relative %: 50.5 % (ref 43.0–77.0)
Platelets: 184 10*3/uL (ref 150.0–400.0)
RBC: 4.46 Mil/uL (ref 3.87–5.11)
RDW: 15.3 % — ABNORMAL HIGH (ref 11.5–14.6)
WBC: 3.4 10*3/uL — ABNORMAL LOW (ref 4.5–10.5)

## 2011-11-29 LAB — HEPATIC FUNCTION PANEL
ALT: 13 U/L (ref 0–35)
AST: 18 U/L (ref 0–37)
Albumin: 3.7 g/dL (ref 3.5–5.2)
Alkaline Phosphatase: 67 U/L (ref 39–117)
Bilirubin, Direct: 0 mg/dL (ref 0.0–0.3)
Total Bilirubin: 0.4 mg/dL (ref 0.3–1.2)
Total Protein: 7.2 g/dL (ref 6.0–8.3)

## 2011-11-29 LAB — POCT URINALYSIS DIPSTICK
Bilirubin, UA: NEGATIVE
Glucose, UA: NEGATIVE
Ketones, UA: NEGATIVE
Spec Grav, UA: 1.015

## 2011-11-29 LAB — TSH: TSH: 3.3 u[IU]/mL (ref 0.35–5.50)

## 2011-11-30 NOTE — Telephone Encounter (Signed)
Tried reaching the pt by telephone.  No answering machine available.  Will try again at a later time.

## 2011-12-04 ENCOUNTER — Telehealth: Payer: Self-pay | Admitting: Internal Medicine

## 2011-12-04 ENCOUNTER — Other Ambulatory Visit: Payer: Self-pay | Admitting: Internal Medicine

## 2011-12-04 ENCOUNTER — Encounter: Payer: Self-pay | Admitting: Family Medicine

## 2011-12-04 NOTE — Telephone Encounter (Signed)
Left message on voicemail for the pt to return my call.  Have tried several times to communicate with the pt.  Will now send a letter.

## 2011-12-04 NOTE — Telephone Encounter (Signed)
Patient called stating she would like a refill of her lisinopril-hctz. Please assist.

## 2011-12-05 ENCOUNTER — Ambulatory Visit (INDEPENDENT_AMBULATORY_CARE_PROVIDER_SITE_OTHER): Payer: 59 | Admitting: Internal Medicine

## 2011-12-05 ENCOUNTER — Encounter: Payer: Self-pay | Admitting: Internal Medicine

## 2011-12-05 VITALS — BP 180/130 | HR 95 | Temp 98.3°F | Ht 64.0 in | Wt 193.0 lb

## 2011-12-05 DIAGNOSIS — I10 Essential (primary) hypertension: Secondary | ICD-10-CM

## 2011-12-05 DIAGNOSIS — E785 Hyperlipidemia, unspecified: Secondary | ICD-10-CM

## 2011-12-05 DIAGNOSIS — D649 Anemia, unspecified: Secondary | ICD-10-CM

## 2011-12-05 DIAGNOSIS — E063 Autoimmune thyroiditis: Secondary | ICD-10-CM

## 2011-12-05 DIAGNOSIS — Z0000 Encounter for general adult medical examination without abnormal findings: Secondary | ICD-10-CM

## 2011-12-05 DIAGNOSIS — K429 Umbilical hernia without obstruction or gangrene: Secondary | ICD-10-CM

## 2011-12-05 NOTE — Patient Instructions (Signed)
Your blood pressure is very high today some of this could be from the stress of the visit.  Start the Prinzide and take a whole pill once a day Monitor her blood pressure periodically at least 3-4 times this week.  Contact us if the readings are not coming down to below 160 level.  Otherwise followup visit in 3-4 weeks and bring your blood pressure monitor with you so we can check it against ours. If things are doing better we can refill the medicines for 90 days or just medicines as appropriate  I think a little belly button area could either be scar tissue or a very small hernia at this time you can wait to see your gynecologist at the next visit but contact us if things are bothering you such as pain or swelling

## 2011-12-05 NOTE — Progress Notes (Signed)
Subjective:    Patient ID: Monica Estrada, female    DOB: 04/20/1966, 46 y.o.   MRN: [id number]  HPI Patient comes in today for preventive visit and follow-up of medical issues. Update  history since  last visit: She delivered a healthy 8 pound AB curl about a year ago vaginal delivery with uncomplicated pregnancy. She had no hypertension in pregnancy noted that her blood pressure was creeping up most recently when she checked it. She could tell this because she was starting to get headaches. We had just called in the medication that she was on before but she hasn't taken it yet. She was on a half a pill Prinzide 20/12.5 in the remote past. 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 Periods are now normal. She wants [place] to check a not around her belly button that she's noted and called the GYN he said was probably scar tissue it pops in and out is not tender. Review of Systems ROS:  GEN/ HEENT: No fever, significant weight changes sweats  vision problems hearing changes, CV/ PULM; No chest pain shortness of breath cough, syncope,edema  change in exercise tolerance. GI /GU: No adominal pain, vomiting, change in bowel habits. No blood in the stool. No significant GU symptoms. SKIN/HEME: ,no acute skin rashes suspicious lesions or bleeding. No lymphadenopathy, nodules, masses.  NEURO/ PSYCH:  No neurologic signs such as weakness numbness. No depression anxiety. IMM/ Allergy: No unusual infections.  Allergy .  Neg  REST of 12 system review negative except as per HPI Outpatient Encounter Prescriptions as of 12/05/2011  Medication Sig Dispense Refill  . DISCONTD: ferrous sulfate 325 (65 FE) MG tablet Take 325 mg by mouth daily with breakfast.       . DISCONTD: Prenatal Vit-Fe Psac Cmplx-FA (PRENATAL MULTIVITAMIN) 60-1 MG tablet Take 1 tablet by mouth daily with breakfast.       Past history family history  social history reviewed in the electronic medical record.    Objective:   Physical Exam BP 180/130  Pulse 95  Temp 98.3 F (36.8 C) (Oral)  Ht 5\' 4"  (1.626 m)  Wt 193 lb (87.544 kg)  BMI 33.13 kg/m2  SpO2 96%  LMP 11/27/2011  Breastfeeding? No BP reading  178/100 reg cuff  Right arm  Physical Exam: Vital signs reviewed WUJ:WJXB is a well-developed well-nourished alert cooperative  aafemale who appears her stated age in no acute distress.  HEENT: normocephalic atraumatic , Eyes: PERRL EOM's full, conjunctiva clear, Nares: paten,t no deformity discharge or tenderness., Ears: no deformity EAC's clear TMs with normal landmarks. Mouth: clear OP, no lesions, edema.  Moist mucous membranes. Dentition some frontal caries  NECK: supple without masses,  or bruits. Thyroid palpable CHEST/PULM:  Clear to auscultation and percussion breath sounds equal no wheeze , rales or rhonchi. No chest wall deformities or tenderness. CV: PMI is nondisplaced, S1 S2 no gallops, murmurs, rubs. Peripheral pulses are full without delay.No JVD .  Breast: normal by inspection . No dimpling, discharge, masses, tenderness or discharge . ABDOMEN: Bowel sounds normal nontender  No guard or rebound, no hepato splenomegal no CVA tenderness. Near umbi small fb size fullness that pops in and out when standing and non tender  Not found when laying down. Extremtities:  No clubbing cyanosis or edema, no acute joint swelling or redness no focal atrophy NEURO:  Oriented x3, cranial nerves 3-12 appear  to be intact, no obvious focal weakness,gait within normal limits no abnormal reflexes or asymmetrical SKIN: No acute rashes normal turgor, color, no bruising or petechiae. PSYCH: Oriented, good eye contact, no obvious depression anxiety, cognition and judgment appear normal. LN: no cervical axillary inguinal adenopathy   Lab Results  Component Value Date   WBC 3.4* 11/29/2011   HGB 11.0* 11/29/2011   HCT 35.1* 11/29/2011   PLT  184.0 11/29/2011   GLUCOSE 90 11/29/2011   CHOL 197 11/29/2011   TRIG 54.0 11/29/2011   HDL 45.40 11/29/2011   LDLDIRECT 144.5 10/31/2010   LDLCALC 141* 11/29/2011   ALT 13 11/29/2011   AST 18 11/29/2011   NA 139 11/29/2011   K 3.7 11/29/2011   CL 106 11/29/2011   CREATININE 0.7 11/29/2011   BUN 14 11/29/2011   CO2 27 11/29/2011   TSH 3.30 11/29/2011       Assessment & Plan:  Preventive Health Care Counseled regarding healthy nutrition, exercise, sleep, injury prevention, calcium vit d and healthy weight . Pap per gyne. Poss  Small umbi hernia vs scar tissue  prob hernia  To get gyne to check . Hypertension not in control by readings today. Curious that she had no problems with hypertension during her pregnancy. And only has recently risen. Checking her blood pressure because she had a mild headache. Unsure how much his white coat phenomenon. We will restart lisinopril HCTZ and take one a day we will have close followup in about a month but earlier if her readings are not coming down. She tolerated this medication previously well Anemia Presumed iron deficiency better than in the past add iron for improvement. Autoimmune thyroiditis TSH is remaining stable high reference range should be monitored every year

## 2011-12-05 NOTE — Telephone Encounter (Signed)
Seen in OV on 12/05/11.

## 2012-01-08 ENCOUNTER — Ambulatory Visit (INDEPENDENT_AMBULATORY_CARE_PROVIDER_SITE_OTHER): Payer: 59 | Admitting: Internal Medicine

## 2012-01-08 ENCOUNTER — Encounter: Payer: Self-pay | Admitting: Internal Medicine

## 2012-01-08 VITALS — BP 140/94 | HR 83 | Temp 98.2°F | Wt 189.0 lb

## 2012-01-08 DIAGNOSIS — Z6832 Body mass index (BMI) 32.0-32.9, adult: Secondary | ICD-10-CM

## 2012-01-08 DIAGNOSIS — I10 Essential (primary) hypertension: Secondary | ICD-10-CM

## 2012-01-08 MED ORDER — LISINOPRIL-HYDROCHLOROTHIAZIDE 20-12.5 MG PO TABS: 1.0000 | ORAL_TABLET | Freq: Every day | ORAL | Status: DC

## 2012-01-08 NOTE — Progress Notes (Signed)
  Subjective:    Patient ID: Monica Estrada, female    DOB: [birth year], 46 y.o.   MRN: [id number]  HPI Patient comes in today as instructed for followup of her blood pressure. She is now taking a whole pill of the Prinzide 20/12.5 and has no significant side effects. She states her blood pressure readings at home are pretty good usually the 120/80 range although occasional has a 90 diastolic. She is out of her medicine for 3 days. No unusual edema chest pain shortness of breath. Has decrease salt and trying to lose weight .  Feels better  No has now.   Review of Systems No fever cp sob edema  Rest as per hpi no cough  No skin changes Past history family history social history reviewed in the electronic medical record. Outpatient Encounter Prescriptions as of 01/08/2012  Medication Sig Dispense Refill  . lisinopril-hydrochlorothiazide (PRINZIDE,ZESTORETIC) 20-12.5 MG per tablet Take 1 tablet by mouth daily.  90 tablet  3  . DISCONTD: lisinopril-hydrochlorothiazide (PRINZIDE,ZESTORETIC) 20-12.5 MG per tablet TAKE ONE-HALF TO ONE TABLET BY MOUTH PER DAY OR AS DIRECTED  30 tablet  1       Objective:   Physical Exam BP 140/94  Pulse 83  Temp 98.2 F (36.8 C) (Oral)  Wt 189 lb (85.73 kg)  SpO2 98%  LMP 12/25/2011  Breastfeeding? No Wt Readings from Last 3 Encounters:  01/08/12 189 lb (85.73 kg)  12/05/11 193 lb (87.544 kg)  12/31/10 197 lb (89.359 kg)   Repeat BP reading 138/88 sitting wdwn in nad  CO rr no g or m  No clubbing cyanosis or edema     Assessment & Plan:  HT better  Control  And follow   Forgot machine but apparently good readings at home. 120/80 range  No obv se of meds   Not at risk of pregnancy per patient.   Disc continue for now .   rov in 6 months or as needed if not controlled .  Refill med  90 x 3 for now  May repeat k at next lab.   Total visit > 50% spent counseling and coordinating care

## 2012-01-08 NOTE — Patient Instructions (Addendum)
Continue same medication .  Monitor   bp reading about 3 x per  Week  To ensure at goal of below 140/90 . ROV in 6 months or as needed Bring in monitor to next visit.

## 2012-02-09 ENCOUNTER — Other Ambulatory Visit: Payer: Self-pay | Admitting: Internal Medicine

## 2012-02-09 DIAGNOSIS — Z1231 Encounter for screening mammogram for malignant neoplasm of breast: Secondary | ICD-10-CM

## 2012-02-27 ENCOUNTER — Ambulatory Visit
Admission: RE | Admit: 2012-02-27 | Discharge: 2012-02-27 | Disposition: A | Discharge status: Home / Self Care | Payer: 59 | Source: Ambulatory Visit | Attending: Internal Medicine | Admitting: Internal Medicine

## 2012-02-27 DIAGNOSIS — Z1231 Encounter for screening mammogram for malignant neoplasm of breast: Secondary | ICD-10-CM

## 2012-03-04 ENCOUNTER — Ambulatory Visit: Payer: 59

## 2012-05-30 ENCOUNTER — Emergency Department (HOSPITAL_COMMUNITY)
Admission: EM | Admit: 2012-05-30 | Discharge: 2012-05-30 | Disposition: A | Discharge status: Home / Self Care | Payer: 59 | Attending: Emergency Medicine | Admitting: Emergency Medicine

## 2012-05-30 ENCOUNTER — Encounter (HOSPITAL_COMMUNITY): Payer: Self-pay | Admitting: *Deleted

## 2012-05-30 ENCOUNTER — Emergency Department (HOSPITAL_COMMUNITY): Payer: 59

## 2012-05-30 DIAGNOSIS — Z8639 Personal history of other endocrine, nutritional and metabolic disease: Secondary | ICD-10-CM | POA: Insufficient documentation

## 2012-05-30 DIAGNOSIS — R109 Unspecified abdominal pain: Secondary | ICD-10-CM | POA: Insufficient documentation

## 2012-05-30 DIAGNOSIS — R05 Cough: Secondary | ICD-10-CM | POA: Insufficient documentation

## 2012-05-30 DIAGNOSIS — Z862 Personal history of diseases of the blood and blood-forming organs and certain disorders involving the immune mechanism: Secondary | ICD-10-CM | POA: Insufficient documentation

## 2012-05-30 DIAGNOSIS — R059 Cough, unspecified: Secondary | ICD-10-CM | POA: Insufficient documentation

## 2012-05-30 DIAGNOSIS — Z9089 Acquired absence of other organs: Secondary | ICD-10-CM | POA: Insufficient documentation

## 2012-05-30 DIAGNOSIS — I10 Essential (primary) hypertension: Secondary | ICD-10-CM

## 2012-05-30 DIAGNOSIS — Z8719 Personal history of other diseases of the digestive system: Secondary | ICD-10-CM | POA: Insufficient documentation

## 2012-05-30 DIAGNOSIS — Z3202 Encounter for pregnancy test, result negative: Secondary | ICD-10-CM | POA: Insufficient documentation

## 2012-05-30 DIAGNOSIS — Z79899 Other long term (current) drug therapy: Secondary | ICD-10-CM | POA: Insufficient documentation

## 2012-05-30 DIAGNOSIS — Z8742 Personal history of other diseases of the female genital tract: Secondary | ICD-10-CM | POA: Insufficient documentation

## 2012-05-30 DIAGNOSIS — Z8669 Personal history of other diseases of the nervous system and sense organs: Secondary | ICD-10-CM | POA: Insufficient documentation

## 2012-05-30 LAB — CBC WITH DIFFERENTIAL/PLATELET
Basophils Absolute: 0 10*3/uL (ref 0.0–0.1)
Eosinophils Absolute: 0.2 10*3/uL (ref 0.0–0.7)
Lymphocytes Relative: 33 % (ref 12–46)
Lymphs Abs: 1.9 10*3/uL (ref 0.7–4.0)
MCHC: 33.4 g/dL (ref 30.0–36.0)
MCV: 72.9 fL — ABNORMAL LOW (ref 78.0–100.0)
Monocytes Relative: 6 % (ref 3–12)
Neutro Abs: 3.3 10*3/uL (ref 1.7–7.7)
Platelets: 221 10*3/uL (ref 150–400)
RDW: 14.3 % (ref 11.5–15.5)
WBC: 5.7 10*3/uL (ref 4.0–10.5)

## 2012-05-30 LAB — URINALYSIS, ROUTINE W REFLEX MICROSCOPIC
Ketones, ur: NEGATIVE mg/dL
Leukocytes, UA: NEGATIVE
Protein, ur: NEGATIVE mg/dL
Urobilinogen, UA: 0.2 mg/dL (ref 0.0–1.0)

## 2012-05-30 LAB — POCT I-STAT, CHEM 8
Creatinine, Ser: 0.8 mg/dL (ref 0.50–1.10)
Glucose, Bld: 112 mg/dL — ABNORMAL HIGH (ref 70–99)
HCT: 37 % (ref 36.0–46.0)
Hemoglobin: 12.6 g/dL (ref 12.0–15.0)
Potassium: 3.6 mEq/L (ref 3.5–5.1)
TCO2: 28 mmol/L (ref 0–100)

## 2012-05-30 MED ORDER — MORPHINE SULFATE 4 MG/ML IJ SOLN
4.0000 mg | Freq: Once | INTRAMUSCULAR | Status: AC
  Administered 2012-05-30: 4 mg via INTRAVENOUS
  Filled 2012-05-30: qty 1

## 2012-05-30 MED ORDER — ONDANSETRON HCL 4 MG/2ML IJ SOLN
4.0000 mg | Freq: Once | INTRAMUSCULAR | Status: AC
  Administered 2012-05-30: 4 mg via INTRAVENOUS
  Filled 2012-05-30: qty 2

## 2012-05-30 MED ORDER — HYDROCODONE-ACETAMINOPHEN 5-325 MG PO TABS: 1.0000 | ORAL_TABLET | Freq: Four times a day (QID) | ORAL | Status: DC | PRN

## 2012-05-30 NOTE — ED Provider Notes (Signed)
History     CSN: [id number]  Arrival date & time 05/30/12  0217   First MD Initiated Contact with Patient 05/30/12 [phone]      Chief Complaint  Patient presents with  . Abdominal Pain    (Consider location/radiation/quality/duration/timing/severity/associated sxs/prior treatment) Patient is a 47 y.o. female presenting with abdominal pain. The history is provided by the patient.  Abdominal Pain The primary symptoms of the illness include abdominal pain. The primary symptoms of the illness do not include fever, fatigue, shortness of breath, nausea, vomiting, diarrhea, hematemesis, hematochezia, dysuria, vaginal discharge or vaginal bleeding. The current episode started 2 days ago. The onset of the illness was gradual. The problem has not changed since onset. The abdominal pain is located in the periumbilical region. The abdominal pain does not radiate. The severity of the abdominal pain is 5/10. The abdominal pain is relieved by being still. Exacerbated by: walking coughing and lifting   The patient states that she believes she is currently not pregnant. Symptoms associated with the illness do not include chills, anorexia, diaphoresis, heartburn, constipation (LNBM today ), urgency, hematuria, frequency or back pain. Significant associated medical issues do not include PUD, GERD, inflammatory bowel disease, diabetes, gallstones, diverticulitis or HIV.    Past Medical History  Diagnosis Date  . Uveitis     stable  . Hypertension   . Hx of abnormal cervical Pap smear   . History of hypothyroidism 1999    of post partum  . Colonic edema     mild left- ed eval abd ct  . Abnormal Pap smear   . Gave birth to child recently     2012    Past Surgical History  Procedure Date  . Cholecystectomy     1999  . Tubal ligation 12/31/2010    Procedure: BILATERAL TUBAL LIGATION;  Surgeon: Leighton Roach Meisinger;  Location: [facility];  Service: Gynecology;  Laterality: Bilateral;  Bilateral post partum  tubal ligation    Family History  Problem Relation Age of Onset  . Hypertension Mother   . Diabetes Mother   . Thyroid disease Mother   . Diabetes Father   . Hypertension Father   . Lupus Sister   . Rheum arthritis Sister   . Other Sister     lamb disease  . Diabetes Brother   . Diabetes Son     History  Substance Use Topics  . Smoking status: Never Smoker   . Smokeless tobacco: Never Used  . Alcohol Use: No    OB History    Grav Para Term Preterm Abortions TAB SAB Ect Mult Living   2 2 2  0 0 0 0 0 0 2      Review of Systems  Constitutional: Negative for fever, chills, diaphoresis and fatigue.  Respiratory: Negative for shortness of breath.   Gastrointestinal: Positive for abdominal pain. Negative for heartburn, nausea, vomiting, diarrhea, constipation (LNBM today ), hematochezia, anorexia and hematemesis.  Genitourinary: Negative for dysuria, urgency, frequency, hematuria, vaginal bleeding and vaginal discharge.  Musculoskeletal: Negative for back pain.  All other systems reviewed and are negative.    Allergies  Review of patient's allergies indicates no known allergies.  Home Medications   Current Outpatient Rx  Name  Route  Sig  Dispense  Refill  . LISINOPRIL-HYDROCHLOROTHIAZIDE 20-12.5 MG PO TABS   Oral   Take 1 tablet by mouth daily.   90 tablet   3     BP 192/129  Pulse 89  Temp 98.6  F (37 C) (Oral)  Resp 16  SpO2 97%  LMP 05/15/2012  Physical Exam  Nursing note and vitals reviewed. Constitutional: Vital signs are normal. She appears well-developed and well-nourished. No distress.       hypertensive  HENT:  Head: Normocephalic and atraumatic.  Mouth/Throat: Uvula is midline, oropharynx is clear and moist and mucous membranes are normal.  Eyes: Conjunctivae normal and EOM are normal. Pupils are equal, round, and reactive to light.  Neck: Normal range of motion and full passive range of motion without pain. Neck supple. No spinous process  tenderness and no muscular tenderness present. No rigidity. No Brudzinski's sign noted.  Cardiovascular: Normal rate and regular rhythm.   Pulmonary/Chest: Effort normal and breath sounds normal. No accessory muscle usage. Not tachypneic. No respiratory distress.  Abdominal: Soft. Normal appearance and bowel sounds are normal. She exhibits no distension, no ascites, no pulsatile midline mass and no mass. There is no hepatosplenomegaly. There is tenderness in the periumbilical area. There is no CVA tenderness. No hernia.       Midline and laparoscopic surgical scar  Lymphadenopathy:    She has no cervical adenopathy.  Neurological: She is alert.  Skin: Skin is warm and dry. No rash noted. She is not diaphoretic.  Psychiatric: She has a normal mood and affect. Her speech is normal and behavior is normal.    ED Course  Procedures (including critical care time)  Labs Reviewed  CBC WITH DIFFERENTIAL - Abnormal; Notable for the following:    Hemoglobin 11.4 (*)     HCT 34.1 (*)     MCV 72.9 (*)     MCH 24.4 (*)     All other components within normal limits  POCT I-STAT, CHEM 8 - Abnormal; Notable for the following:    Glucose, Bld 112 (*)     Calcium, Ion 1.26 (*)     All other components within normal limits  URINALYSIS, ROUTINE W REFLEX MICROSCOPIC  POCT PREGNANCY, URINE   Dg Abd Acute W/chest  05/30/2012  *RADIOLOGY REPORT*  Clinical Data: Left lower quadrant abdominal pain.  ACUTE ABDOMEN SERIES (ABDOMEN 2 VIEW & CHEST 1 VIEW)  Comparison: Chest 01/10/2007.  CT abdomen and pelvis 10/24/2009.  Findings: The heart size and pulmonary vascularity are normal. The lungs appear clear and expanded without focal air space disease or consolidation. No blunting of the costophrenic angles.  No pneumothorax.  Mild thoracic scoliosis.  Gas and stool in the colon.  No small or large bowel distension. No free intra-abdominal air.  No abnormal air fluid levels. Surgical clips in the right upper quadrant.   Phleboliths in the pelvis.  No radiopaque stones demonstrated.  Mild degenerative changes in the lumbar spine and hips.  IMPRESSION: No evidence of active pulmonary disease.  Nonobstructive bowel gas pattern.  Stool filled colon.   Original Report Authenticated By: Burman Nieves, M.D.      No diagnosis found.  BP re-check at 4:34 AM - 158/86 Pt presented non-toxic with mild-mod perumbilical abd pain, but no other associated symptoms. Upon my evaluation all labs and imaging had resulted. Pt requested pain medication, but states she does not want to receive an abdominal CT. We had a VERY in depth conversation on reasons to return to the emergency department and pt states she verbalizes understanding. Based on being afebrile without leukocytosis i am agreeable with pts decision to be discharged. She appears to be a reliable source to return if symptoms worsen in any way or  she develops a fever.   MDM  Abdominal Pain, Hypertension   Patient is nontoxic, nonseptic appearing, in no apparent distress.  Patient's pain adequately managed in emergency department. Labs, imaging and vitals reviewed.  Patient does not meet the SIRS or Sepsis criteria.  On repeat exam patient does not have a surgical abdomin and there are nor peritoneal signs.  No indication of appendicitis, bowel obstruction, bowel perforation, cholecystitis, diverticulitis.  Patient discharged home with symptomatic treatment and given strict instructions for follow-up with their primary care physician.  I have also discussed reasons to return immediately to the ER.  Patient expresses understanding and agrees with plan.           Jaci Carrel, [place] 05/30/12 [phone]

## 2012-05-30 NOTE — ED Notes (Signed)
Pt c/o lower abdominal pain that started tonight.  States she has been coughing x 2 days and worried she burst her hernia.

## 2012-05-30 NOTE — ED Provider Notes (Signed)
Medical screening examination/treatment/procedure(s) were performed by non-physician practitioner and as supervising physician I was immediately available for consultation/collaboration.  Aubra Pappalardo K Talin Rozeboom-Rasch, MD 05/30/12 0522 

## 2012-06-28 ENCOUNTER — Encounter (HOSPITAL_COMMUNITY): Payer: Self-pay

## 2012-06-28 ENCOUNTER — Emergency Department (HOSPITAL_COMMUNITY)
Admission: EM | Admit: 2012-06-28 | Discharge: 2012-06-28 | Disposition: A | Discharge status: Home / Self Care | Payer: 59 | Attending: Emergency Medicine | Admitting: Emergency Medicine

## 2012-06-28 ENCOUNTER — Emergency Department (HOSPITAL_COMMUNITY): Payer: 59

## 2012-06-28 DIAGNOSIS — Z9089 Acquired absence of other organs: Secondary | ICD-10-CM | POA: Insufficient documentation

## 2012-06-28 DIAGNOSIS — R6889 Other general symptoms and signs: Secondary | ICD-10-CM

## 2012-06-28 DIAGNOSIS — R509 Fever, unspecified: Secondary | ICD-10-CM | POA: Insufficient documentation

## 2012-06-28 DIAGNOSIS — Z862 Personal history of diseases of the blood and blood-forming organs and certain disorders involving the immune mechanism: Secondary | ICD-10-CM | POA: Insufficient documentation

## 2012-06-28 DIAGNOSIS — J029 Acute pharyngitis, unspecified: Secondary | ICD-10-CM | POA: Insufficient documentation

## 2012-06-28 DIAGNOSIS — Z3202 Encounter for pregnancy test, result negative: Secondary | ICD-10-CM | POA: Insufficient documentation

## 2012-06-28 DIAGNOSIS — IMO0001 Reserved for inherently not codable concepts without codable children: Secondary | ICD-10-CM | POA: Insufficient documentation

## 2012-06-28 DIAGNOSIS — Z8669 Personal history of other diseases of the nervous system and sense organs: Secondary | ICD-10-CM | POA: Insufficient documentation

## 2012-06-28 DIAGNOSIS — B9789 Other viral agents as the cause of diseases classified elsewhere: Secondary | ICD-10-CM

## 2012-06-28 DIAGNOSIS — R5381 Other malaise: Secondary | ICD-10-CM | POA: Insufficient documentation

## 2012-06-28 DIAGNOSIS — R51 Headache: Secondary | ICD-10-CM | POA: Insufficient documentation

## 2012-06-28 DIAGNOSIS — Z79899 Other long term (current) drug therapy: Secondary | ICD-10-CM | POA: Insufficient documentation

## 2012-06-28 DIAGNOSIS — R079 Chest pain, unspecified: Secondary | ICD-10-CM | POA: Insufficient documentation

## 2012-06-28 DIAGNOSIS — R1013 Epigastric pain: Secondary | ICD-10-CM | POA: Insufficient documentation

## 2012-06-28 DIAGNOSIS — Z8639 Personal history of other endocrine, nutritional and metabolic disease: Secondary | ICD-10-CM | POA: Insufficient documentation

## 2012-06-28 DIAGNOSIS — I10 Essential (primary) hypertension: Secondary | ICD-10-CM | POA: Insufficient documentation

## 2012-06-28 DIAGNOSIS — Z8719 Personal history of other diseases of the digestive system: Secondary | ICD-10-CM | POA: Insufficient documentation

## 2012-06-28 DIAGNOSIS — Z9851 Tubal ligation status: Secondary | ICD-10-CM | POA: Insufficient documentation

## 2012-06-28 LAB — CBC
MCHC: 34.5 g/dL (ref 30.0–36.0)
Platelets: 273 10*3/uL (ref 150–400)
RDW: 14.1 % (ref 11.5–15.5)
WBC: 9.8 10*3/uL (ref 4.0–10.5)

## 2012-06-28 LAB — BASIC METABOLIC PANEL
BUN: 8 mg/dL (ref 6–23)
Calcium: 9.4 mg/dL (ref 8.4–10.5)
Chloride: 98 mEq/L (ref 96–112)
Creatinine, Ser: 0.8 mg/dL (ref 0.50–1.10)
GFR calc Af Amer: >90 mL/min (ref >90)
GFR calc non Af Amer: 86 mL/min — ABNORMAL LOW (ref >90)

## 2012-06-28 LAB — URINALYSIS, ROUTINE W REFLEX MICROSCOPIC
Bilirubin Urine: NEGATIVE
Ketones, ur: NEGATIVE mg/dL
Leukocytes, UA: NEGATIVE
Nitrite: NEGATIVE
Protein, ur: NEGATIVE mg/dL
Urobilinogen, UA: 0.2 mg/dL (ref 0.0–1.0)
pH: 6 (ref 5.0–8.0)

## 2012-06-28 LAB — POCT I-STAT TROPONIN I: Troponin i, poc: 0 ng/mL (ref 0.00–0.08)

## 2012-06-28 MED ORDER — GUAIFENESIN ER 600 MG PO TB12: 1200.0000 mg | ORAL_TABLET | Freq: Two times a day (BID) | ORAL | Status: DC

## 2012-06-28 MED ORDER — ACETAMINOPHEN 325 MG PO TABS
650.0000 mg | ORAL_TABLET | Freq: Once | ORAL | Status: AC
  Administered 2012-06-28: 650 mg via ORAL
  Filled 2012-06-28: qty 2

## 2012-06-28 MED ORDER — OMEPRAZOLE 20 MG PO CPDR: 20.0000 mg | DELAYED_RELEASE_CAPSULE | Freq: Every day | ORAL | Status: DC

## 2012-06-28 MED ORDER — POTASSIUM CHLORIDE CRYS ER 20 MEQ PO TBCR
40.0000 meq | EXTENDED_RELEASE_TABLET | Freq: Once | ORAL | Status: AC
  Administered 2012-06-28: 40 meq via ORAL
  Filled 2012-06-28: qty 2

## 2012-06-28 MED ORDER — BENZOCAINE 20 % MT SOLN: 1.0000 "application " | Freq: Three times a day (TID) | OROMUCOSAL | Status: DC | PRN

## 2012-06-28 MED ORDER — SODIUM CHLORIDE 0.9 % IV BOLUS (SEPSIS)
1000.0000 mL | Freq: Once | INTRAVENOUS | Status: AC
  Administered 2012-06-28: 1000 mL via INTRAVENOUS

## 2012-06-28 MED ORDER — IPRATROPIUM BROMIDE 0.03 % NA SOLN: 2.0000 | Freq: Two times a day (BID) | NASAL | Status: DC

## 2012-06-28 MED ORDER — GI COCKTAIL ~~LOC~~
30.0000 mL | Freq: Once | ORAL | Status: AC
  Administered 2012-06-28: 30 mL via ORAL
  Filled 2012-06-28: qty 30

## 2012-06-28 NOTE — ED Notes (Signed)
Pt still has a headache 

## 2012-06-28 NOTE — ED Notes (Signed)
The pt collected a urine specimen and sent to the lab.  Tylenol given  For her temp

## 2012-06-28 NOTE — ED Notes (Addendum)
Pt states she started having mid cp 2 days ago with radiation through to her back. States she had picked up her grand-daughter and that is when she noticed it started hurting. Also reports SOB and mild HA. States the pain is worse with movement. States has also been running a fever since last night.

## 2012-06-28 NOTE — ED Notes (Signed)
The pt has been ill for 2-3 days with multiple symptoms.  Headache chest pain abd pain hot and cold episodes intermittent.  No nv or diarrhea.  Lmp feb 1st

## 2012-06-28 NOTE — ED Provider Notes (Signed)
History     CSN: [id number]  Arrival date & time 06/28/12  1348   First MD Initiated Contact with Patient 06/28/12 1643      Chief Complaint  Patient presents with  . Chest Pain    (Consider location/radiation/quality/duration/timing/severity/associated sxs/prior treatment) The history is provided by the patient and medical records.    Monica Estrada is a 47 y.o. female  with a hx of HTN, hypothyroidism  presents to the Emergency Department complaining of gradual, persistent, progressively worsening chest pain, fatigue onset 2 days ago. Associated symptoms include fever, chills, headache, chest pain and pressure, abdominal epigastric pain, myalgias.  Resting makes it better and sitting up and moving around makes it worse.  Pt denies nausea, vomiting, diarrhea, dysuria, hematuria, sinus pressure, cough, congestion.  Pt with usual BM QD, but none yesterday. Pt with stomach virus 2 weeks ago from which she recovered. Pt denies sick contacts; she did not receive a flu shot this year.      Past Medical History  Diagnosis Date  . Uveitis     stable  . Hypertension   . Hx of abnormal cervical Pap smear   . History of hypothyroidism 1999    of post partum  . Colonic edema     mild left- ed eval abd ct  . Abnormal Pap smear   . Gave birth to child recently     2012    Past Surgical History  Procedure Laterality Date  . Cholecystectomy      1999  . Tubal ligation  12/31/2010    Procedure: BILATERAL TUBAL LIGATION;  Surgeon: Leighton Roach Meisinger;  Location: [facility];  Service: Gynecology;  Laterality: Bilateral;  Bilateral post partum tubal ligation    Family History  Problem Relation Age of Onset  . Hypertension Mother   . Diabetes Mother   . Thyroid disease Mother   . Diabetes Father   . Hypertension Father   . Lupus Sister   . Rheum arthritis Sister   . Other Sister     lamb disease  . Diabetes Brother   . Diabetes Son     History  Substance Use Topics  . Smoking  status: Never Smoker   . Smokeless tobacco: Never Used  . Alcohol Use: No    OB History   Grav Para Term Preterm Abortions TAB SAB Ect Mult Living   2 2 2  0 0 0 0 0 0 2      Review of Systems  Constitutional: Positive for fever and fatigue. Negative for diaphoresis, appetite change and unexpected weight change.  HENT: Negative for ear pain, nosebleeds, congestion, sneezing, mouth sores, neck pain, neck stiffness, postnasal drip and sinus pressure.   Eyes: Negative for visual disturbance.  Respiratory: Positive for shortness of breath. Negative for cough, chest tightness and wheezing.   Cardiovascular: Positive for chest pain.  Gastrointestinal: Positive for abdominal pain. Negative for nausea, vomiting, diarrhea and constipation.  Endocrine: Negative for polydipsia, polyphagia and polyuria.  Genitourinary: Negative for dysuria, urgency, frequency and hematuria.  Musculoskeletal: Positive for myalgias. Negative for back pain.  Skin: Positive for rash.  Neurological: Negative for syncope, weakness, light-headedness and headaches.  Hematological: Does not bruise/bleed easily.  Psychiatric/Behavioral: Negative for sleep disturbance. The patient is not nervous/anxious.   All other systems reviewed and are negative.    Allergies  Review of patient's allergies indicates no known allergies.  Home Medications   Current Outpatient Rx  Name  Route  Sig  Dispense  Refill  . ibuprofen (ADVIL,MOTRIN) 200 MG tablet   Oral   Take 400 mg by mouth every 6 (six) hours as needed for pain.         Marland Kitchen lisinopril-hydrochlorothiazide (PRINZIDE,ZESTORETIC) 20-12.5 MG per tablet   Oral   Take 1 tablet by mouth daily.   90 tablet   3   . guaiFENesin (MUCINEX) 600 MG 12 hr tablet   Oral   Take 2 tablets (1,200 mg total) by mouth 2 (two) times daily.   20 tablet   0   . ipratropium (ATROVENT) 0.03 % nasal spray   Nasal   Place 2 sprays into the nose 2 (two) times daily. PRN congestion   30  mL   0   . omeprazole (PRILOSEC) 20 MG capsule   Oral   Take 1 capsule (20 mg total) by mouth daily.   30 capsule   0     BP 108/68  Pulse 95  Temp(Src) 99.2 F (37.3 C) (Oral)  Resp 14  SpO2 95%  LMP 05/15/2012  Physical Exam  Nursing note and vitals reviewed. Constitutional: She is oriented to person, place, and time. She appears well-developed and well-nourished. No distress.  HENT:  Head: Normocephalic and atraumatic.  Right Ear: Tympanic membrane, external ear and ear canal normal.  Left Ear: Tympanic membrane, external ear and ear canal normal.  Nose: Nose normal. No mucosal edema or rhinorrhea.  Mouth/Throat: Uvula is midline. Mucous membranes are not dry and not cyanotic. Posterior oropharyngeal erythema (mild) present. No oropharyngeal exudate, posterior oropharyngeal edema or tonsillar abscesses.  Eyes: Conjunctivae and EOM are normal. Pupils are equal, round, and reactive to light. No scleral icterus. Right pupil is reactive.  Neck: Normal range of motion and full passive range of motion without pain. Neck supple. No spinous process tenderness and no muscular tenderness present. No rigidity. Normal range of motion present. No Brudzinski's sign and no Kernig's sign noted.  Cardiovascular: Normal rate, regular rhythm, normal heart sounds and intact distal pulses.  Exam reveals no gallop and no friction rub.   No murmur heard. Pulmonary/Chest: Effort normal and breath sounds normal. No respiratory distress. She has no wheezes. She has no rales. She exhibits no tenderness.  Abdominal: Soft. Bowel sounds are normal. She exhibits no distension and no mass. There is no tenderness. There is no rebound and no guarding.  Musculoskeletal: Normal range of motion. She exhibits no edema and no tenderness.  Lymphadenopathy:    She has no cervical adenopathy.  Neurological: She is alert and oriented to person, place, and time. She exhibits normal muscle tone. Coordination normal.   Speech is clear and goal oriented Moves extremities without ataxia  Skin: Skin is warm and dry. No rash noted. She is not diaphoretic. No erythema.  Psychiatric: She has a normal mood and affect.    ED Course  Procedures (including critical care time)  Labs Reviewed  BASIC METABOLIC PANEL - Abnormal; Notable for the following:    Potassium 3.0 (*)    Glucose, Bld 163 (*)    GFR calc non Af Amer 86 (*)    All other components within normal limits  CBC - Abnormal; Notable for the following:    Hemoglobin 11.7 (*)    HCT 33.9 (*)    MCV 71.8 (*)    MCH 24.8 (*)    All other components within normal limits  URINALYSIS, ROUTINE W REFLEX MICROSCOPIC  PREGNANCY, URINE  D-DIMER, QUANTITATIVE  POCT I-STAT TROPONIN I  Dg Chest 2 View  06/28/2012  *RADIOLOGY REPORT*  Clinical Data: Chest pain.  Fever.  Coughing.  Shortness of breath. Hypertension.  Diabetes.  CHEST - 2 VIEW  Comparison: 01/10/2007.  05/30/2012.  Findings: Cardiac silhouette is normal size and shape.  Mediastinal and hilar contours appear stable.  No pulmonary infiltrates or masses are seen.  No pleural effusion is evident.  There is stable scoliosis convexity to the right. Minimal degenerative spondylosis changes are seen.  There is fecal distention of portions of the colon. Cholecystectomy clips are present.  IMPRESSION: No acute or active cardiopulmonary or pleural abnormalities are seen.  Scoliosis.  Degenerative spondylosis.  Fecal distention of portions of the colon.   Original Report Authenticated By: Onalee Hua Call    ECG:  Date: 06/28/2012  Rate: 122  Rhythm: sinus tachycardia  QRS Axis: normal  Intervals: normal  ST/T Wave abnormalities: normal and nonspecific T wave changes  Conduction Disutrbances:none  Narrative Interpretation: T wave flattening and inversion in II, III and aVF  Old EKG Reviewed: none available    1. Flu-like symptoms   2. Chest pain   3. Sore throat (viral)   4. Fever       MDM   Monica Estrada presents with flu like illness accompanied by chest pain.  Pt with negative cardiac work-up.  Chest pain is not likely of cardiac or pulmonary etiology d/t presentation, perc negative, VSS, no tracheal deviation, no JVD or new murmur, RRR, breath sounds equal bilaterally, EKG without acute abnormalities, negative troponin, and negative CXR. Pt's symptoms are likely 2/2 to viral illness.  Pt has been advised to return to the ED if CP becomes exertional, associated with diaphoresis or nausea, radiates to left jaw/arm, worsens or becomes concerning in any way. Pt appears reliable for follow up and is agreeable to discharge. Will give symptomatic treatment.    Case has been discussed with Dr. Ignacia Palma who agrees with the above plan to discharge.    1. Medications: atrovent NS, mucinex, usual home medications 2. Treatment: rest, drink plenty of fluids, alternate tylenol and motrin for fever control,  3. Follow Up: Please followup with your primary doctor for discussion of your diagnoses and further evaluation after today's visit;      Monica Client Deshundra Waller, PA-C 06/28/12 2051

## 2012-06-28 NOTE — ED Provider Notes (Signed)
7:19 PM  Date: 06/28/2012  Rate: 122  Rhythm: sinus tachycardia  QRS Axis: normal  Intervals: normal QRS:  Poor R wave progression in  Precordial leads suggests possible old anterior myocardial infarction.  ST/T Wave abnormalities: normal  Conduction Disutrbances:none  Narrative Interpretation: Abnormal EKG  Old EKG Reviewed: none available    Carleene Cooper III, MD 06/28/12 1921

## 2012-07-01 NOTE — ED Provider Notes (Signed)
Medical screening examination/treatment/procedure(s) were performed by non-physician practitioner and as supervising physician I was immediately available for consultation/collaboration.   Carleene Cooper III, MD 07/01/12 [phone]

## 2012-08-01 ENCOUNTER — Ambulatory Visit: Payer: Self-pay | Admitting: Internal Medicine

## 2012-08-01 ENCOUNTER — Telehealth: Payer: Self-pay | Admitting: Internal Medicine

## 2012-08-01 NOTE — Telephone Encounter (Signed)
Patient Information:  Caller Name: Deshawna  Phone: [phone]  Patient: Monica Estrada, Monica Estrada  Gender: Female  DOB: 12-Nov-1965  Age: 47 Years  PCP: Berniece Andreas (Family Practice)  Pregnant: No  Office Follow Up:  Does the office need to follow up with this patient?: No  Instructions For The Office: N/A   Symptoms  Reason For Call & Symptoms: Having lower back pain, below the waist.  It is painful to move or ambulate.  Has had sx since 10p last night.  Reviewed Health History In EMR: Yes  Reviewed Medications In EMR: Yes  Reviewed Allergies In EMR: Yes  Reviewed Surgeries / Procedures: Yes  Date of Onset of Symptoms: 07/31/2012 OB / GYN:  LMP: 07/11/2012  Guideline(s) Used:  Flank Pain  Disposition Per Guideline:   Go to ED Now  Reason For Disposition Reached:   Severe pain (e.g., excruciating, scale 8-10) and present > 1 hour  Advice Given:  N/A  RN Overrode Recommendation:  Make Appointment  appointments available  Appointment Scheduled:  08/01/2012 15:30:00 Appointment Scheduled Provider:  Artist Pais, Doe-Hyun Molly Maduro) (Adults only)

## 2012-11-26 ENCOUNTER — Ambulatory Visit (INDEPENDENT_AMBULATORY_CARE_PROVIDER_SITE_OTHER): Payer: 59 | Admitting: Internal Medicine

## 2012-11-26 ENCOUNTER — Encounter: Payer: Self-pay | Admitting: Internal Medicine

## 2012-11-26 VITALS — BP 160/90 | HR 72 | Temp 97.6°F | Wt 186.0 lb

## 2012-11-26 DIAGNOSIS — I10 Essential (primary) hypertension: Secondary | ICD-10-CM

## 2012-11-26 DIAGNOSIS — R202 Paresthesia of skin: Secondary | ICD-10-CM | POA: Insufficient documentation

## 2012-11-26 DIAGNOSIS — Z862 Personal history of diseases of the blood and blood-forming organs and certain disorders involving the immune mechanism: Secondary | ICD-10-CM

## 2012-11-26 DIAGNOSIS — R209 Unspecified disturbances of skin sensation: Secondary | ICD-10-CM

## 2012-11-26 DIAGNOSIS — Z8639 Personal history of other endocrine, nutritional and metabolic disease: Secondary | ICD-10-CM | POA: Insufficient documentation

## 2012-11-26 LAB — BASIC METABOLIC PANEL
BUN: 8 mg/dL (ref 6–23)
CO2: 30 mEq/L (ref 19–32)
Calcium: 9.7 mg/dL (ref 8.4–10.5)
Chloride: 104 mEq/L (ref 96–112)
Creatinine, Ser: 0.8 mg/dL (ref 0.4–1.2)
GFR: 97.1 mL/min (ref >60.00)
Glucose, Bld: 62 mg/dL — ABNORMAL LOW (ref 70–99)
Potassium: 3.6 mEq/L (ref 3.5–5.1)
Sodium: 138 mEq/L (ref 135–145)

## 2012-11-26 LAB — CBC WITH DIFFERENTIAL/PLATELET
Basophils Absolute: 0 10*3/uL (ref 0.0–0.1)
Basophils Relative: 0.8 % (ref 0.0–3.0)
Eosinophils Absolute: 0.1 10*3/uL (ref 0.0–0.7)
Eosinophils Relative: 3 % (ref 0.0–5.0)
HCT: 34.6 % — ABNORMAL LOW (ref 36.0–46.0)
Hemoglobin: 10.9 g/dL — ABNORMAL LOW (ref 12.0–15.0)
Lymphocytes Relative: 42.2 % (ref 12.0–46.0)
Lymphs Abs: 1.7 10*3/uL (ref 0.7–4.0)
MCHC: 31.4 g/dL (ref 30.0–36.0)
MCV: 75 fl — ABNORMAL LOW (ref 78.0–100.0)
Monocytes Absolute: 0.3 10*3/uL (ref 0.1–1.0)
Monocytes Relative: 7.4 % (ref 3.0–12.0)
Neutro Abs: 1.9 10*3/uL (ref 1.4–7.7)
Neutrophils Relative %: 46.6 % (ref 43.0–77.0)
Platelets: 245 10*3/uL (ref 150.0–400.0)
RBC: 4.61 Mil/uL (ref 3.87–5.11)
RDW: 16.9 % — ABNORMAL HIGH (ref 11.5–14.6)
WBC: 4.1 10*3/uL — ABNORMAL LOW (ref 4.5–10.5)

## 2012-11-26 LAB — MAGNESIUM: Magnesium: 2.1 mg/dL (ref 1.5–2.5)

## 2012-11-26 LAB — TSH: TSH: 4.17 u[IU]/mL (ref 0.35–5.50)

## 2012-11-26 MED ORDER — AMLODIPINE BESYLATE 5 MG PO TABS: 5.0000 mg | ORAL_TABLET | Freq: Every day | ORAL | Status: DC

## 2012-11-26 NOTE — Patient Instructions (Addendum)
Your blood pressure is too high. It is possible that the tingling was related to   Sleeping oddly but should be better for now. Add medication as discussed.     Will arrange  Neurology consuylt about the tingling also.   Lab today    ROV In 1 month or as needed to make sure BP is better .

## 2012-11-26 NOTE — Progress Notes (Signed)
Chief Complaint  Patient presents with  . Numbness    RT leg and arm.  Ongoing for 2 weeks.    HPI: Patient comes in today for SDA for  new problem evaluation. hasnt been seen here since 8 13  Has had 2 ed visits in the past year   For acut issues /. When visit was for nausea and her potassium was low at 3.0 and she was given IV potassium and I believe a prescription hasn't had it checked since. States that her blood pressures usually pretty good 120-140 but hasn't checked her readings recently. Is taking medicine regularly he may have missed 2 doses last week  She comes in today because she had the onset of left arm and leg numbness tingly feeling after falling asleep holding HER-48-year-old 35 pound baby in a chair. When she woke up the distal left leg was done on him and couldn't feel it it soon got better but she has persistent left medial shin tingling and numbness in her leg feels a bit weaker she also has a funny feeling in her medial left arm.  No dropping falling syncope chest pain shortness of breath.  She sleeps interrupted up about every 2 hours because of small child. When she had her colonoscopy someone told her she could have some mild sleep apnea and her blood pressure went low  She is right-handed. Periods are bit irregular gets hot flashes sometimes spends to see her GYN yearly but she thinks she is overdue no unusual bleeding. ROS: See pertinent positives and negatives per HPI. No chest pain shortness of breath palpitations significant cramping  Past Medical History  Diagnosis Date  . Uveitis     stable  . Hypertension   . Hx of abnormal cervical Pap smear   . History of hypothyroidism 1999    of post partum  . Colonic edema     mild left- ed eval abd ct  . Abnormal Pap smear   . Gave birth to child recently     2012    Family History  Problem Relation Age of Onset  . Hypertension Mother   . Diabetes Mother   . Thyroid disease Mother   . Diabetes Father   .  Hypertension Father   . Lupus Sister   . Rheum arthritis Sister   . Other Sister     lamb disease  . Diabetes Brother   . Diabetes Son     History   Social History  . Marital Status: Single    Spouse Name: N/A    Number of Children: N/A  . Years of Education: N/A   Social History Main Topics  . Smoking status: Never Smoker   . Smokeless tobacco: Never Used  . Alcohol Use: No  . Drug Use: No  . Sexually Active: Not Currently   Other Topics Concern  . None   Social History Narrative   [name]  ABOUT 30 HOURS    Single   HH of 3 son  63 y  And baby girl 2 year  Father out of country comes back ocass. Some financial support .    Father  In [place].   No pets.    Neg ets .    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 lisinopril-hydrochlorothiazide (PRINZIDE,ZESTORETIC) 20-12.5  MG per tablet Take 1 tablet by mouth daily.  90 tablet  3  . amLODipine (NORVASC) 5 MG tablet Take 1 tablet (5 mg total) by mouth daily.  30 tablet  3  . [DISCONTINUED] benzocaine (HURRICAINE) 20 % SOLN Use as directed 1 application in the mouth or throat 3 (three) times daily as needed.  9.75 mL  0  . [DISCONTINUED] guaiFENesin (MUCINEX) 600 MG 12 hr tablet Take 2 tablets (1,200 mg total) by mouth 2 (two) times daily.  20 tablet  0  . [DISCONTINUED] ibuprofen (ADVIL,MOTRIN) 200 MG tablet Take 400 mg by mouth every 6 (six) hours as needed for pain.      . [DISCONTINUED] ipratropium (ATROVENT) 0.03 % nasal spray Place 2 sprays into the nose 2 (two) times daily. PRN congestion  30 mL  0  . [DISCONTINUED] omeprazole (PRILOSEC) 20 MG capsule Take 1 capsule (20 mg total) by mouth daily.  30 capsule  0   No facility-administered encounter medications on file as of 11/26/2012.    EXAM:  BP 160/90  Pulse 72  Temp(Src) 97.6 F (36.4 C) (Oral)  Wt 186 lb (84.369 kg)  BMI 31.91 kg/m2  SpO2 98%  LMP  11/12/2012  Breastfeeding? No  Body mass index is 31.91 kg/(m^2).  GENERAL: vitals reviewed and listed above, alert, oriented, appears well hydrated and in no acute distress  HEENT: atraumatic, conjunctiva  clear, no obvious abnormalities on inspection of external nose and ears OP : no lesion edema or exudate  NECK: no obvious masses on inspection palpation no adenopathy JVD LUNGS: clear to auscultation bilaterally, no wheezes, rales or rhonchi, good air movement Abdomen soft without megaly guarding or rebound CV: HRRR, no clubbing cyanosis or  peripheral edema nl cap refill repeat blood pressure 160/90 and 160/100 pulses equal MS: moves all extremities without noticeable focal  abnormality neurologic cranial nerves III through XII appear intact toe and heel walk normal no foot drop left leg might be slightly less than the right in size no edema no gross weakness DTRs hard to elicit but present no clonus noted decreased subjective sensation medial left distal leg.  PSYCH: pleasant and cooperative, no obvious depression or anxiety Lab Results  Component Value Date   WBC 9.8 06/28/2012   HGB 11.7* 06/28/2012   HCT 33.9* 06/28/2012   PLT 273 06/28/2012   GLUCOSE 163* 06/28/2012   CHOL 197 11/29/2011   TRIG 54.0 11/29/2011   HDL 45.40 11/29/2011   LDLDIRECT 144.5 10/31/2010   LDLCALC 141* 11/29/2011   ALT 13 11/29/2011   AST 18 11/29/2011   NA 136 06/28/2012   K 3.0* 06/28/2012   CL 98 06/28/2012   CREATININE 0.80 06/28/2012   BUN 8 06/28/2012   CO2 26 06/28/2012   TSH 3.30 11/29/2011    ASSESSMENT AND PLAN:  Discussed the following assessment and plan:  Tingling - Left sided onset after sleep and possible compression however atypical persistent - Plan: amLODipine (NORVASC) 5 MG tablet, Basic metabolic panel, CBC with Differential, TSH, Magnesium, T4, free, Ambulatory referral to Neurology  HYPERTENSION - Currently not in control based on readings today no recent monitoring add amlodipine close  followup - Plan: amLODipine (NORVASC) 5 MG tablet, Basic metabolic panel, CBC with Differential, TSH, Magnesium, T4, free, Ambulatory referral to Neurology  History of hypokalemia - Needs followup recheck at risk because symptoms and cause resistant blood pressure  -Patient advised to return or notify health care team  if symptoms worsen or persist or  new concerns arise.  Patient Instructions  Your blood pressure is too high. It is possible that the tingling was related to   Sleeping oddly but should be better for now. Add medication as discussed.     Will arrange  Neurology consuylt about the tingling also.   Lab today    ROV In 1 month or as needed to make sure BP is better .   Neta Mends. Dietrich Samuelson M.D.

## 2013-01-01 ENCOUNTER — Encounter (INDEPENDENT_AMBULATORY_CARE_PROVIDER_SITE_OTHER): Payer: Self-pay | Admitting: Ophthalmology

## 2013-01-06 ENCOUNTER — Encounter: Payer: Self-pay | Admitting: Internal Medicine

## 2013-01-06 ENCOUNTER — Ambulatory Visit (INDEPENDENT_AMBULATORY_CARE_PROVIDER_SITE_OTHER): Payer: 59 | Admitting: Internal Medicine

## 2013-01-06 VITALS — BP 128/88 | HR 70 | Temp 97.7°F | Wt 190.0 lb

## 2013-01-06 DIAGNOSIS — R05 Cough: Secondary | ICD-10-CM

## 2013-01-06 DIAGNOSIS — I10 Essential (primary) hypertension: Secondary | ICD-10-CM

## 2013-01-06 DIAGNOSIS — R202 Paresthesia of skin: Secondary | ICD-10-CM

## 2013-01-06 DIAGNOSIS — R059 Cough, unspecified: Secondary | ICD-10-CM

## 2013-01-06 DIAGNOSIS — H209 Unspecified iridocyclitis: Secondary | ICD-10-CM

## 2013-01-06 DIAGNOSIS — R209 Unspecified disturbances of skin sensation: Secondary | ICD-10-CM

## 2013-01-06 MED ORDER — AMLODIPINE BESYLATE 5 MG PO TABS: 5.0000 mg | ORAL_TABLET | Freq: Every day | ORAL | Status: DC

## 2013-01-06 MED ORDER — LISINOPRIL-HYDROCHLOROTHIAZIDE 20-12.5 MG PO TABS: 1.0000 | ORAL_TABLET | Freq: Every day | ORAL | Status: DC

## 2013-01-06 NOTE — Progress Notes (Signed)
Chief Complaint  Patient presents with  . Follow-up    HPI: Fu of  Uncontrolled ht  Added norvasc at the last visit and she is taking her medications without difficulty. Blood pressure seems much better. Neuro referral ;she cancelled it cause sx went away Eye doctor ; dates that Uveitis  is returned and sent her to Dr sanders  seen last Friday   Steroid  Eye drop. Prescribed Considering sarcoid .   Hads a  persistant  cough that predated blood pressure medications. A number of laboratory studies were done and a chest x-ray was suggested to look for possible sarcoid     ROS: See pertinent positives and negatives per HPI. No chest pain shortness of breath at this time. No significant edema. Needs refill of medication.  Past Medical History  Diagnosis Date  . Uveitis     stable  . Hypertension   . Hx of abnormal cervical Pap smear   . History of hypothyroidism 1999    of post partum  . Colonic edema     mild left- ed eval abd ct  . Abnormal Pap smear   . Gave birth to child recently     2012  . Colonic edema     Family History  Problem Relation Age of Onset  . Hypertension Mother   . Diabetes Mother   . Thyroid disease Mother   . Diabetes Father   . Hypertension Father   . Lupus Sister   . Rheum arthritis Sister   . Other Sister     lamb disease  . Diabetes Brother   . Diabetes Son     History   Social History  . Marital Status: Single    Spouse Name: N/A    Number of Children: N/A  . Years of Education: N/A   Social History Main Topics  . Smoking status: Never Smoker   . Smokeless tobacco: Never Used  . Alcohol Use: No  . Drug Use: No  . Sexual Activity: Not Currently   Other Topics Concern  . None   Social History Narrative   [name]  ABOUT 30 HOURS    Single   HH of 3 son  55 y  And baby girl 2 year  Father out of country comes back ocass. Some financial support .    Father  In [place].   No pets.    Neg ets .    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 amLODipine (NORVASC) 5 MG tablet Take 1 tablet (5 mg total) by mouth daily.  90 tablet  3  . lisinopril-hydrochlorothiazide (PRINZIDE,ZESTORETIC) 20-12.5 MG per tablet Take 1 tablet by mouth daily.  90 tablet  3  . [DISCONTINUED] amLODipine (NORVASC) 5 MG tablet Take 1 tablet (5 mg total) by mouth daily.  30 tablet  3  . [DISCONTINUED] lisinopril-hydrochlorothiazide (PRINZIDE,ZESTORETIC) 20-12.5 MG per tablet Take 1 tablet by mouth daily.  90 tablet  3   No facility-administered encounter medications on file as of 01/06/2013.    EXAM:  BP 128/88  Pulse 70  Temp(Src) 97.7 F (36.5 C) (Oral)  Wt 190 lb (86.183 kg)  BMI 32.6 kg/m2  SpO2 98%  LMP 12/23/2012  Body mass index is 32.6 kg/(m^2).  GENERAL: vitals reviewed and listed above, alert, oriented, appears well hydrated  and in no acute distress  HEENT: atraumatic, conjunctiva  clear, no obvious abnormalities on inspection of external nose and ears throat is clear no nasal drainage NECK: no obvious masses on inspection palpation  LUNGS: clear to auscultation bilaterally, no wheezes, rales or rhonchi, good air movement CV: HRRR, no clubbing cyanosis or  peripheral edema nl cap refill  MS: moves all extremities without noticeable focal  abnormality PSYCH: pleasant and cooperative, no obvious depression or anxiety  ASSESSMENT AND PLAN:  Discussed the following assessment and plan:  HYPERTENSION - Much better control today continue  Uveitis - recurrent under eval for underlying cause get cxray will review fax when arrives ; labs as appropriate. - Plan: DG Chest 2 View  Cough - I suppose could be a ACEIcough but she says it predates medication we'll follow - Plan: DG Chest 2 View  Tingling - resolved per patient - Plan: amLODipine (NORVASC) 5 MG tablet  -Patient advised to return or notify health care team  if symptoms  worsen or persist or new concerns arise.  Patient Instructions  Will order chest x ray for reasons state, Refill bp medication doing much better Will review eye evaluation and see what other labs may be needed.  Plan ROV in   2 months or as needed.     Neta Mends. Linsie Lupo M.D.

## 2013-01-06 NOTE — Patient Instructions (Signed)
Will order chest x ray for reasons state, Refill bp medication doing much better Will review eye evaluation and see what other labs may be needed.  Plan ROV in   2 months or as needed.

## 2013-01-07 ENCOUNTER — Ambulatory Visit (INDEPENDENT_AMBULATORY_CARE_PROVIDER_SITE_OTHER)
Admission: RE | Admit: 2013-01-07 | Discharge: 2013-01-07 | Disposition: A | Discharge status: Home / Self Care | Payer: 59 | Source: Ambulatory Visit | Attending: Internal Medicine | Admitting: Internal Medicine

## 2013-01-07 DIAGNOSIS — H209 Unspecified iridocyclitis: Secondary | ICD-10-CM

## 2013-01-07 DIAGNOSIS — R059 Cough, unspecified: Secondary | ICD-10-CM

## 2013-01-07 DIAGNOSIS — R05 Cough: Secondary | ICD-10-CM

## 2013-01-08 NOTE — Progress Notes (Signed)
Quick Note:  Tell patient that x ray shows no acute abnormality. dont have Notes to review yet. ______

## 2013-01-31 DIAGNOSIS — H35351 Cystoid macular degeneration, right eye: Secondary | ICD-10-CM | POA: Insufficient documentation

## 2013-01-31 DIAGNOSIS — H3021 Posterior cyclitis, right eye: Secondary | ICD-10-CM | POA: Insufficient documentation

## 2013-02-05 ENCOUNTER — Other Ambulatory Visit: Payer: Self-pay | Admitting: Family Medicine

## 2013-02-05 DIAGNOSIS — R202 Paresthesia of skin: Secondary | ICD-10-CM

## 2013-02-05 MED ORDER — AMLODIPINE BESYLATE 5 MG PO TABS: 5.0000 mg | ORAL_TABLET | Freq: Every day | ORAL | Status: DC

## 2013-03-04 ENCOUNTER — Ambulatory Visit: Payer: 59 | Admitting: Internal Medicine

## 2013-03-05 ENCOUNTER — Other Ambulatory Visit: Payer: Self-pay

## 2013-03-05 DIAGNOSIS — Z1231 Encounter for screening mammogram for malignant neoplasm of breast: Secondary | ICD-10-CM

## 2013-03-28 ENCOUNTER — Ambulatory Visit
Admission: RE | Admit: 2013-03-28 | Discharge: 2013-03-28 | Disposition: A | Discharge status: Home / Self Care | Payer: 59 | Source: Ambulatory Visit

## 2013-03-28 DIAGNOSIS — Z1231 Encounter for screening mammogram for malignant neoplasm of breast: Secondary | ICD-10-CM

## 2013-03-31 ENCOUNTER — Ambulatory Visit: Payer: 59

## 2013-04-09 DIAGNOSIS — B001 Herpesviral vesicular dermatitis: Secondary | ICD-10-CM | POA: Insufficient documentation

## 2013-05-01 ENCOUNTER — Other Ambulatory Visit: Payer: Self-pay | Admitting: Internal Medicine

## 2013-06-08 ENCOUNTER — Other Ambulatory Visit: Payer: Self-pay | Admitting: Internal Medicine

## 2013-06-11 ENCOUNTER — Other Ambulatory Visit: Payer: Self-pay | Admitting: Internal Medicine

## 2013-09-08 ENCOUNTER — Other Ambulatory Visit: Payer: Self-pay | Admitting: Internal Medicine

## 2013-11-06 ENCOUNTER — Other Ambulatory Visit: Payer: Self-pay | Admitting: Internal Medicine

## 2013-12-03 ENCOUNTER — Other Ambulatory Visit: Payer: Self-pay | Admitting: Internal Medicine

## 2013-12-05 NOTE — Telephone Encounter (Signed)
Sent #30 to the pharmacy.  Pt is due for CPE and lab work.  Left a message on her cell and home phone for her to call the office.

## 2014-02-25 ENCOUNTER — Telehealth: Payer: Self-pay | Admitting: Internal Medicine

## 2014-02-25 ENCOUNTER — Ambulatory Visit (INDEPENDENT_AMBULATORY_CARE_PROVIDER_SITE_OTHER): Payer: 59 | Admitting: Physician Assistant

## 2014-02-25 ENCOUNTER — Encounter: Payer: Self-pay | Admitting: Physician Assistant

## 2014-02-25 ENCOUNTER — Ambulatory Visit (INDEPENDENT_AMBULATORY_CARE_PROVIDER_SITE_OTHER)
Admission: RE | Admit: 2014-02-25 | Discharge: 2014-02-25 | Disposition: A | Discharge status: Home / Self Care | Payer: 59 | Source: Ambulatory Visit | Attending: Physician Assistant | Admitting: Physician Assistant

## 2014-02-25 VITALS — BP 124/86 | HR 92 | Temp 98.0°F | Resp 20 | Ht 64.0 in | Wt 206.3 lb

## 2014-02-25 DIAGNOSIS — R079 Chest pain, unspecified: Secondary | ICD-10-CM

## 2014-02-25 DIAGNOSIS — J40 Bronchitis, not specified as acute or chronic: Secondary | ICD-10-CM

## 2014-02-25 DIAGNOSIS — J209 Acute bronchitis, unspecified: Secondary | ICD-10-CM

## 2014-02-25 LAB — CBC WITH DIFFERENTIAL/PLATELET
BASOS PCT: 0.9 % (ref 0.0–3.0)
Basophils Absolute: 0.1 10*3/uL (ref 0.0–0.1)
EOS PCT: 9.2 % — AB (ref 0.0–5.0)
Eosinophils Absolute: 0.6 10*3/uL (ref 0.0–0.7)
HEMATOCRIT: 37.6 % (ref 36.0–46.0)
HEMOGLOBIN: 11.9 g/dL — AB (ref 12.0–15.0)
LYMPHS ABS: 1.8 10*3/uL (ref 0.7–4.0)
LYMPHS PCT: 28.6 % (ref 12.0–46.0)
MCHC: 31.6 g/dL (ref 30.0–36.0)
MCV: 79.1 fl (ref 78.0–100.0)
MONOS PCT: 7.8 % (ref 3.0–12.0)
Monocytes Absolute: 0.5 10*3/uL (ref 0.1–1.0)
NEUTROS ABS: 3.4 10*3/uL (ref 1.4–7.7)
Neutrophils Relative %: 53.5 % (ref 43.0–77.0)
Platelets: 225 10*3/uL (ref 150.0–400.0)
RBC: 4.75 Mil/uL (ref 3.87–5.11)
RDW: 14.6 % (ref 11.5–15.5)
WBC: 6.3 10*3/uL (ref 4.0–10.5)

## 2014-02-25 LAB — COMPREHENSIVE METABOLIC PANEL
ALT: 16 U/L (ref 0–35)
AST: 19 U/L (ref 0–37)
Albumin: 3.4 g/dL — ABNORMAL LOW (ref 3.5–5.2)
Alkaline Phosphatase: 63 U/L (ref 39–117)
BILIRUBIN TOTAL: 0.5 mg/dL (ref 0.2–1.2)
BUN: 11 mg/dL (ref 6–23)
CALCIUM: 9.7 mg/dL (ref 8.4–10.5)
CHLORIDE: 104 meq/L (ref 96–112)
CO2: 28 meq/L (ref 19–32)
CREATININE: 0.8 mg/dL (ref 0.4–1.2)
GFR: 105.56 mL/min (ref >60.00)
GLUCOSE: 73 mg/dL (ref 70–99)
Potassium: 4.5 mEq/L (ref 3.5–5.1)
Sodium: 138 mEq/L (ref 135–145)
Total Protein: 8 g/dL (ref 6.0–8.3)

## 2014-02-25 LAB — T4, FREE: Free T4: 0.63 ng/dL (ref 0.60–1.60)

## 2014-02-25 LAB — TSH: TSH: 7.88 u[IU]/mL — AB (ref 0.35–4.50)

## 2014-02-25 MED ORDER — PREDNISONE 20 MG PO TABS: 40.0000 mg | ORAL_TABLET | Freq: Every day | ORAL | Status: DC

## 2014-02-25 NOTE — Progress Notes (Signed)
Pre visit review using our clinic review tool, if applicable. No additional management support is needed unless otherwise documented below in the visit note. 

## 2014-02-25 NOTE — Patient Instructions (Addendum)
You will have a chest Xray done today at the [place] office. We will call with the results of this when available.  You will have some lab work done today. We will call you with the results of these when available. You need to make a follow up appointment for 2 weeks to discuss these results with Dr. Regis Bill.  Prednisone 2 tabs daily for 5 days to help with your airway inflammation. Take each dose with a meal to prevent nausea.  Plain Over the Counter Mucinex (NOT Mucinex D) for thick secretions  Force NON dairy fluids, drinking plenty of water is best.    Over the Counter Flonase OR Nasacort AQ 1 spray in each nostril twice a day as needed. Use the "crossover" technique into opposite nostril spraying toward opposite ear @ 45 degree angle, not straight up into nostril.   Plain Over the Counter Allegra (NOT D )  160 daily , OR Loratidine 10 mg , OR Zyrtec 10 mg @ bedtime  as needed for itchy eyes & sneezing.  Saline Irrigation and Saline Sprays can also help reduce symptoms.  If emergency symptoms discussed during visit developed, seek medical attention immediately.  Followup in 2 weeks to reassess, or for worsening or persistent symptoms despite treatment.    Acute Bronchitis Bronchitis is when the airways that extend from the windpipe into the lungs get red, puffy, and painful (inflamed). Bronchitis often causes thick spit (mucus) to develop. This leads to a cough. A cough is the most common symptom of bronchitis. In acute bronchitis, the condition usually begins suddenly and goes away over time (usually in 2 weeks). Smoking, allergies, and asthma can make bronchitis worse. Repeated episodes of bronchitis may cause more lung problems. HOME CARE  Rest.  Drink enough fluids to keep your pee (urine) clear or pale yellow (unless you need to limit fluids as told by your doctor).  Only take over-the-counter or prescription medicines as told by your doctor.  Avoid smoking and secondhand smoke.  These can make bronchitis worse. If you are a smoker, think about using nicotine gum or skin patches. Quitting smoking will help your lungs heal faster.  Reduce the chance of getting bronchitis again by:  Washing your hands often.  Avoiding people with cold symptoms.  Trying not to touch your hands to your mouth, nose, or eyes.  Follow up with your doctor as told. GET HELP IF: Your symptoms do not improve after 1 week of treatment. Symptoms include:  Cough.  Fever.  Coughing up thick spit.  Body aches.  Chest congestion.  Chills.  Shortness of breath.  Sore throat. GET HELP RIGHT AWAY IF:   You have an increased fever.  You have chills.  You have severe shortness of breath.  You have bloody thick spit (sputum).  You throw up (vomit) often.  You lose too much body fluid (dehydration).  You have a severe headache.  You faint. MAKE SURE YOU:   Understand these instructions.  Will watch your condition.  Will get help right away if you are not doing well or get worse. Document Released: 10/18/2007 Document Revised: 01/01/2013 Document Reviewed: 10/22/2012 [facility] Patient Information 2015 [place], [place]. This information is not intended to replace advice given to you by your health care provider. Make sure you discuss any questions you have with your health care provider. Bronchospasm A bronchospasm is when the tubes that carry air in and out of your lungs (airways) spasm or tighten. During a bronchospasm it is  hard to breathe. This is because the airways get smaller. A bronchospasm can be triggered by:  Allergies. These may be to animals, pollen, food, or mold.  Infection. This is a common cause of bronchospasm.  Exercise.  Irritants. These include pollution, cigarette smoke, strong odors, aerosol sprays, and paint fumes.  Weather changes.  Stress.  Being emotional. HOME CARE   Always have a plan for getting help. Know when to call your doctor and  local emergency services (911 in the [place] Know where you can get emergency care.  Only take medicines as told by your doctor.  If you were prescribed an inhaler or nebulizer machine, ask your doctor how to use it correctly. Always use a spacer with your inhaler if you were given one.  Stay calm during an attack. Try to relax and breathe more slowly.  Control your home environment:  Change your heating and air conditioning filter at least once a month.  Limit your use of fireplaces and wood stoves.  Do not  smoke. Do not  allow smoking in your home.  Avoid perfumes and fragrances.  Get rid of pests (such as roaches and mice) and their droppings.  Throw away plants if you see mold on them.  Keep your house clean and dust free.  Replace carpet with wood, tile, or vinyl flooring. Carpet can trap dander and dust.  Use allergy-proof pillows, mattress covers, and box spring covers.  Wash bed sheets and blankets every week in hot water. Dry them in a dryer.  Use blankets that are made of polyester or cotton.  Wash hands frequently. GET HELP IF:  You have muscle aches.  You have chest pain.  The thick spit you spit or cough up (sputum) changes from clear or white to yellow, green, gray, or bloody.  The thick spit you spit or cough up gets thicker.  There are problems that may be related to the medicine you are given such as:  A rash.  Itching.  Swelling.  Trouble breathing. GET HELP RIGHT AWAY IF:  You feel you cannot breathe or catch your breath.  You cannot stop coughing.  Your treatment is not helping you breathe better.  You have very bad chest pain. MAKE SURE YOU:   Understand these instructions.  Will watch your condition.  Will get help right away if you are not doing well or get worse. Document Released: 02/26/2009 Document Revised: 05/06/2013 Document Reviewed: 10/22/2012 [facility] Patient Information 2015 [place], [place]. This information is not  intended to replace advice given to you by your health care provider. Make sure you discuss any questions you have with your health care provider.

## 2014-02-25 NOTE — Progress Notes (Signed)
Subjective:    Patient ID: Monica Estrada, female    DOB: 25-Sep-1965, 48 y.o.   MRN: [id number]  URI  This is a new problem. The current episode started 1 to 4 weeks ago (1 week). The problem has been gradually worsening. Associated symptoms include chest pain (with coughing, no radiation.), congestion, coughing, neck pain, a sore throat and wheezing. Pertinent negatives include no abdominal pain, diarrhea, dysuria, ear pain, headaches, joint pain, joint swelling, nausea, plugged ear sensation, rash, rhinorrhea, sinus pain, sneezing, swollen glands or vomiting. Treatments tried: tried sons albuterol inhaler.  The treatment provided no relief.      Review of Systems  Constitutional: Positive for fever, chills and diaphoresis.  HENT: Positive for congestion and sore throat. Negative for ear pain, postnasal drip, rhinorrhea, sinus pressure and sneezing.   Respiratory: Positive for cough and wheezing. Negative for shortness of breath (with coughing and walking.).   Cardiovascular: Positive for chest pain (with coughing, no radiation.). Negative for palpitations.  Gastrointestinal: Negative for nausea, vomiting, abdominal pain and diarrhea.  Genitourinary: Negative for dysuria.  Musculoskeletal: Positive for neck pain. Negative for joint pain.  Skin: Negative for rash.  Neurological: Negative for syncope and headaches.  All other systems reviewed and are negative.  Past Medical History  Diagnosis Date  . Uveitis     stable  . Hypertension   . Hx of abnormal cervical Pap smear   . History of hypothyroidism 1999    of post partum  . Colonic edema     mild left- ed eval abd ct  . Abnormal Pap smear   . Gave birth to child recently     2012  . Colonic edema     History   Social History  . Marital Status: Single    Spouse Name: N/A    Number of Children: N/A  . Years of Education: N/A   Occupational History  . Not on file.   Social History Main Topics  . Smoking status:  Never Smoker   . Smokeless tobacco: Never Used  . Alcohol Use: No  . Drug Use: No  . Sexual Activity: Not Currently   Other Topics Concern  . Not on file   Social History Narrative   [name]  ABOUT 29 HOURS    Single   HH of 3 son  58 y  And baby girl 2 year  Father out of country comes back ocass. Some financial support .    Father  In [place].   No pets.    Neg ets .    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 Cholecystectomy      1999  . Tubal ligation  12/31/2010    Procedure: BILATERAL TUBAL LIGATION;  Surgeon: Blane Ohara Meisinger;  Location: [facility];  Service: Gynecology;  Laterality: Bilateral;  Bilateral post partum tubal ligation    Family History  Problem Relation Age of Onset  . Hypertension Mother   . Diabetes Mother   . Thyroid disease Mother   . Diabetes Father   . Hypertension Father   . Lupus Sister   . Rheum arthritis Sister   . Other Sister     lamb disease  . Diabetes Brother   . Diabetes Son     No Known Allergies  Current Outpatient Prescriptions on File Prior  to Visit  Medication Sig Dispense Refill  . amLODipine (NORVASC) 5 MG tablet TAKE 1 TABLET EVERY DAY  30 tablet  0  . lisinopril-hydrochlorothiazide (PRINZIDE,ZESTORETIC) 20-12.5 MG per tablet Take 1 tablet by mouth daily.  90 tablet  3   No current facility-administered medications on file prior to visit.    EXAM: BP 124/86  Pulse 92  Temp(Src) 98 F (36.7 C) (Oral)  Resp 20  Ht 5\' 4"  (1.626 m)  Wt 206 lb 4.8 oz (93.577 kg)  BMI 35.39 kg/m2  SpO2 98%     Objective:   Physical Exam  Nursing note and vitals reviewed. Constitutional: She is oriented to person, place, and time. She appears well-developed and well-nourished. No distress.  HENT:  Head: Normocephalic and atraumatic.  Right Ear: External ear normal.  Left Ear: External ear normal.  Nose: Nose normal.  Mouth/Throat: No oropharyngeal exudate.    Oropharynx is slightly erythematous, no exudate. Bilateral TMs normal. Bilateral frontal and maxillary sinuses non-TTP.  Eyes: Conjunctivae and EOM are normal.  Neck: Normal range of motion. Neck supple.  Cardiovascular: Normal rate, regular rhythm and intact distal pulses.   Pulmonary/Chest: Effort normal. No stridor. No respiratory distress. She has wheezes. She has no rales. She exhibits no tenderness.  Lymphadenopathy:    She has no cervical adenopathy.  Neurological: She is alert and oriented to person, place, and time.  Skin: Skin is warm and dry. She is not diaphoretic. No pallor.  Psychiatric: She has a normal mood and affect. Her behavior is normal. Judgment and thought content normal.     Lab Results  Component Value Date   WBC 4.1* 11/26/2012   HGB 10.9* 11/26/2012   HCT 34.6* 11/26/2012   PLT 245.0 11/26/2012   GLUCOSE 62* 11/26/2012   CHOL 197 11/29/2011   TRIG 54.0 11/29/2011   HDL 45.40 11/29/2011   LDLDIRECT 144.5 10/31/2010   LDLCALC 141* 11/29/2011   ALT 13 11/29/2011   AST 18 11/29/2011   NA 138 11/26/2012   K 3.6 11/26/2012   CL 104 11/26/2012   CREATININE 0.8 11/26/2012   BUN 8 11/26/2012   CO2 30 11/26/2012   TSH 4.17 11/26/2012   EKG: Low voltage in precordial leads, probably normal for pt.      Assessment & Plan:  Monica Estrada was seen today for chest pain and shortness of breath.  Diagnoses and associated orders for this visit:  Chest pain, unspecified chest pain type Comments: EKG normal for pt. No acute changes. f/u in 2 weeks. - EKG 12-Lead - CBC with Differential - CMP - TSH - T4, Free  Bronchitis with bronchospasm Comments: Chest Xray today. Prednoisone for wheezing, OTC mucinex, nasal steroid, antihistamine, watchful waiting, rest, push fluids. f/u in 2 weeks. - CBC with Differential - CMP - TSH - T4, Free - DG Chest 2 View; Future - predniSONE (DELTASONE) 20 MG tablet; Take 2 tablets (40 mg total) by mouth daily with breakfast.    I spoke with  Dr. Regis Bill regarding the pt, and we agree that the symptoms don't sound concerning for coronary or PE origin. Pts EKG had low voltage over precordial leads, probably normal for pt, and was otherwise WNL. We will obtain lab work today and Chest XR. Pt will follow up in 2 weeks with PCP to reassess. Pt is amenable to this plan.  Return precautions provided, and patient handout on bronchitis, wheezing.  Plan to follow up as needed, or for worsening or persistent symptoms despite treatment.  Patient Instructions  You will have a chest Xray done today at the [place] office. We will call with the results of this when available.  You will have some lab work done today. We will call you with the results of these when available. You need to make a follow up appointment for 2 weeks to discuss these results with Dr. Regis Bill.  Prednisone 2 tabs daily for 5 days to help with your airway inflammation. Take each dose with a meal to prevent nausea.  Plain Over the Counter Mucinex (NOT Mucinex D) for thick secretions  Force NON dairy fluids, drinking plenty of water is best.    Over the Counter Flonase OR Nasacort AQ 1 spray in each nostril twice a day as needed. Use the "crossover" technique into opposite nostril spraying toward opposite ear @ 45 degree angle, not straight up into nostril.   Plain Over the Counter Allegra (NOT D )  160 daily , OR Loratidine 10 mg , OR Zyrtec 10 mg @ bedtime  as needed for itchy eyes & sneezing.  Saline Irrigation and Saline Sprays can also help reduce symptoms.  If emergency symptoms discussed during visit developed, seek medical attention immediately.  Followup in 2 weeks to reassess, or for worsening or persistent symptoms despite treatment.

## 2014-02-25 NOTE — Telephone Encounter (Signed)
Noted  

## 2014-02-25 NOTE — Telephone Encounter (Signed)
Patient Information:  Caller Name: Remo Lipps  Phone: [phone]  Patient: Monica Estrada  Gender: Female  DOB: 1965-08-12  Age: 48 Years  PCP: Shanon Ace [facility])  Pregnant: No  Office Follow Up:  Does the office need to follow up with this patient?: No  Instructions For The Office: N/A   Symptoms  Reason For Call & Symptoms: Pt reports she has SOB with cough/congestion, sore throat, and hurting in the back between shoulder blades.  Reviewed Health History In EMR: Yes  Reviewed Medications In EMR: Yes  Reviewed Allergies In EMR: Yes  Reviewed Surgeries / Procedures: Yes  Date of Onset of Symptoms: 02/19/2014 OB / GYN:  LMP: 02/18/2014  Guideline(s) Used:  Cough  Disposition Per Guideline:   Go to Office Now  Reason For Disposition Reached:   Increasing ankle swelling  Advice Given:  Call Back If:  You become worse.  Patient Will Follow Care Advice:  YES  Appointment Scheduled:  02/25/2014 09:30:00 Appointment Scheduled Provider:  Kela Millin (only sees ages 61 and up)

## 2014-03-03 ENCOUNTER — Other Ambulatory Visit: Payer: Self-pay

## 2014-03-03 DIAGNOSIS — Z1239 Encounter for other screening for malignant neoplasm of breast: Secondary | ICD-10-CM

## 2014-03-16 ENCOUNTER — Other Ambulatory Visit: Payer: Self-pay | Admitting: Family Medicine

## 2014-03-16 ENCOUNTER — Encounter: Payer: Self-pay | Admitting: Physician Assistant

## 2014-03-17 NOTE — Telephone Encounter (Signed)
#  14 sent to the pharmacy.  Pt must make an office visit.  Have tried in the past to reach her.

## 2014-03-26 ENCOUNTER — Other Ambulatory Visit: Payer: Self-pay | Admitting: Internal Medicine

## 2014-03-26 NOTE — Telephone Encounter (Signed)
Declined.  Have tried to reach the pt in the past for an OV.  Must make appointment first and will then fill.

## 2014-04-17 ENCOUNTER — Encounter: Payer: Self-pay | Admitting: Internal Medicine

## 2014-04-17 ENCOUNTER — Ambulatory Visit (INDEPENDENT_AMBULATORY_CARE_PROVIDER_SITE_OTHER): Payer: 59 | Admitting: Internal Medicine

## 2014-04-17 VITALS — BP 134/90 | Temp 98.2°F | Ht 64.0 in | Wt 207.2 lb

## 2014-04-17 DIAGNOSIS — R946 Abnormal results of thyroid function studies: Secondary | ICD-10-CM

## 2014-04-17 DIAGNOSIS — Z79899 Other long term (current) drug therapy: Secondary | ICD-10-CM

## 2014-04-17 DIAGNOSIS — I10 Essential (primary) hypertension: Secondary | ICD-10-CM

## 2014-04-17 DIAGNOSIS — R7989 Other specified abnormal findings of blood chemistry: Secondary | ICD-10-CM

## 2014-04-17 DIAGNOSIS — Z2821 Immunization not carried out because of patient refusal: Secondary | ICD-10-CM

## 2014-04-17 MED ORDER — LISINOPRIL-HYDROCHLOROTHIAZIDE 20-12.5 MG PO TABS: 1.0000 | ORAL_TABLET | Freq: Every day | ORAL | Status: DC

## 2014-04-17 MED ORDER — AMLODIPINE BESYLATE 5 MG PO TABS: 5.0000 mg | ORAL_TABLET | Freq: Every day | ORAL | Status: DC

## 2014-04-17 NOTE — Patient Instructions (Signed)
Restart the  bp medication.  Take blood pressure readings twice a day for 7- 10 days and then periodically .To ensure below 140/90   .Send in readings    After  a month back on medication  And we may adjust if not at goal.  Will notify you  of labs when available. For thyroid abnormality  Consideration of medication if needed.    ROV in  3 months or as needed and bring  In BP  monitor .Marland Kitchen   DASH Eating Plan DASH stands for "Dietary Approaches to Stop Hypertension." The DASH eating plan is a healthy eating plan that has been shown to reduce high blood pressure (hypertension). Additional health benefits may include reducing the risk of type 2 diabetes mellitus, heart disease, and stroke. The DASH eating plan may also help with weight loss. WHAT DO I NEED TO KNOW ABOUT THE DASH EATING PLAN? For the DASH eating plan, you will follow these general guidelines:  Choose foods with a percent daily value for sodium of less than 5% (as listed on the food label).  Use salt-free seasonings or herbs instead of table salt or sea salt.  Check with your health care provider or pharmacist before using salt substitutes.  Eat lower-sodium products, often labeled as "lower sodium" or "no salt added."  Eat fresh foods.  Eat more vegetables, fruits, and low-fat dairy products.  Choose whole grains. Look for the word "whole" as the first word in the ingredient list.  Choose fish and skinless chicken or [place] more often than red meat. Limit fish, poultry, and meat to 6 oz (170 g) each day.  Limit sweets, desserts, sugars, and sugary drinks.  Choose heart-healthy fats.  Limit cheese to 1 oz (28 g) per day.  Eat more home-cooked food and less restaurant, buffet, and fast food.  Limit fried foods.  Cook foods using methods other than frying.  Limit canned vegetables. If you do use them, rinse them well to decrease the sodium.  When eating at a restaurant, ask that your food be prepared with less  salt, or no salt if possible. WHAT FOODS CAN I EAT? Seek help from a dietitian for individual calorie needs. Grains Whole grain or whole wheat bread. Brown rice. Whole grain or whole wheat pasta. Quinoa, bulgur, and whole grain cereals. Low-sodium cereals. Corn or whole wheat flour tortillas. Whole grain cornbread. Whole grain crackers. Low-sodium crackers. Vegetables Fresh or frozen vegetables (raw, steamed, roasted, or grilled). Low-sodium or reduced-sodium tomato and vegetable juices. Low-sodium or reduced-sodium tomato sauce and paste. Low-sodium or reduced-sodium canned vegetables.  Fruits All fresh, canned (in natural juice), or frozen fruits. Meat and Other Protein Products Ground beef (85% or leaner), grass-fed beef, or beef trimmed of fat. Skinless chicken or [place]. Ground chicken or [place]. Pork trimmed of fat. All fish and seafood. Eggs. Dried beans, peas, or lentils. Unsalted nuts and seeds. Unsalted canned beans. Dairy Low-fat dairy products, such as skim or 1% milk, 2% or reduced-fat cheeses, low-fat ricotta or cottage cheese, or plain low-fat yogurt. Low-sodium or reduced-sodium cheeses. Fats and Oils Tub margarines without trans fats. Light or reduced-fat mayonnaise and salad dressings (reduced sodium). Avocado. Safflower, olive, or canola oils. Natural peanut or almond butter. Other Unsalted popcorn and pretzels. The items listed above may not be a complete list of recommended foods or beverages. Contact your dietitian for more options. WHAT FOODS ARE NOT RECOMMENDED? Grains White bread. White pasta. White rice. Refined cornbread. Bagels and croissants. Crackers that  contain trans fat. Vegetables Creamed or fried vegetables. Vegetables in a cheese sauce. Regular canned vegetables. Regular canned tomato sauce and paste. Regular tomato and vegetable juices. Fruits Dried fruits. Canned fruit in light or heavy syrup. Fruit juice. Meat and Other Protein Products Fatty cuts of  meat. Ribs, chicken wings, bacon, sausage, bologna, salami, chitterlings, fatback, hot dogs, bratwurst, and packaged luncheon meats. Salted nuts and seeds. Canned beans with salt. Dairy Whole or 2% milk, cream, half-and-half, and cream cheese. Whole-fat or sweetened yogurt. Full-fat cheeses or blue cheese. Nondairy creamers and whipped toppings. Processed cheese, cheese spreads, or cheese curds. Condiments Onion and garlic salt, seasoned salt, table salt, and sea salt. Canned and packaged gravies. Worcestershire sauce. Tartar sauce. Barbecue sauce. Teriyaki sauce. Soy sauce, including reduced sodium. Steak sauce. Fish sauce. Oyster sauce. Cocktail sauce. Horseradish. Ketchup and mustard. Meat flavorings and tenderizers. Bouillon cubes. Hot sauce. Tabasco sauce. Marinades. Taco seasonings. Relishes. Fats and Oils Butter, stick margarine, lard, shortening, ghee, and bacon fat. Coconut, palm kernel, or palm oils. Regular salad dressings. Other Pickles and olives. Salted popcorn and pretzels. The items listed above may not be a complete list of foods and beverages to avoid. Contact your dietitian for more information. WHERE CAN I FIND MORE INFORMATION? National Heart, Lung, and Blood Institute: travelstabloid.com Document Released: 04/20/2011 Document Revised: 09/15/2013 Document Reviewed: 03/05/2013 [facility] Patient Information 2015 [place], [place]. This information is not intended to replace advice given to you by your health care provider. Make sure you discuss any questions you have with your health care provider.

## 2014-04-17 NOTE — Progress Notes (Signed)
Chief Complaint  Patient presents with  . Follow-up    HPI: Monica Estrada 48 y.o. ;seen over a year ago  Here for Chronic disease management   HT ;some was out of  lisinopril pre ran out.  Up some then  No se  Had cp episode  rx r/o bronchospasm  Better  ocass  Cough   No sob at this time  Lots of stress hasnt been exercising cause of that . 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 Uveitis     stable  . Hypertension   . Hx of abnormal cervical Pap smear   . History of hypothyroidism 1999    of post partum  . Colonic edema     mild left- ed eval abd ct  . Abnormal Pap smear   . Gave birth to child recently     2012  . Colonic edema     Family History  Problem Relation Age of Onset  . Hypertension Mother   . Diabetes Mother   . Thyroid disease Mother   . Diabetes Father   . Hypertension Father   . Lupus Sister   . Rheum arthritis Sister   . Other Sister     lamb disease  . Diabetes Brother   . Diabetes Son   . Stroke Sister     History   Social History  . Marital Status: Single    Spouse Name: N/A    Number of Children: N/A  . Years of Education: N/A   Social History Main Topics  . Smoking status: Never Smoker   . Smokeless tobacco: Never Used  . Alcohol Use: No  . Drug Use: No  . Sexual Activity: Not Currently   Other Topics Concern  . Not on file   Social History Narrative   [name]  ABOUT 57 HOURS    Single   HH of 3 son  2 y  And baby girl 2 year  Father out of country comes back ocass. Some financial support .    Father  In [place].   No pets.    Neg ets .    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 amLODipine (NORVASC) 5 MG tablet Take 1 tablet (5 mg total) by mouth daily.  Marland Kitchen  lisinopril-hydrochlorothiazide (PRINZIDE,ZESTORETIC) 20-12.5 MG per tablet Take 1 tablet by mouth daily.  Marland Kitchen nystatin-triamcinolone ointment (MYCOLOG)   . predniSONE (DELTASONE) 20 MG tablet Take 2 tablets (40 mg total) by mouth daily with breakfast. (Patient taking differently: Take 20 mg by mouth daily with breakfast. )  . [DISCONTINUED] amLODipine (NORVASC) 5 MG tablet TAKE 1 TABLET EVERY DAY  . [DISCONTINUED] lisinopril-hydrochlorothiazide (PRINZIDE,ZESTORETIC) 20-12.5 MG per tablet Take 1 tablet by mouth daily.  . [DISCONTINUED] amLODipine (NORVASC) 5 MG tablet TAKE 1 TABLET EVERY DAY    EXAM:  BP 134/90 mmHg  Temp(Src) 98.2 F (36.8 C) (Oral)  Ht 5\' 4"  (1.626 m)  Wt 207 lb 3.2 oz (93.985 kg)  BMI 35.55 kg/m2  Body mass index is 35.55 kg/(m^2). Repeat bp large 130/90 GENERAL: vitals reviewed and listed above, alert, oriented, appears well hydrated and in no  acute distress HEENT: atraumatic, conjunctiva  clear, no obvious abnormalities on inspection of external nose and ears NECK: no obvious masses on inspection palpation  LUNGS: clear to auscultation bilaterally, no wheezes, rales or rhonchi, good air movement CV: HRRR, no clubbing cyanosis or  peripheral edema nl cap refill  MS: moves all extremities without noticeable focal  abnormality PSYCH: pleasant and cooperative, no obvious depression or anxiety  ASSESSMENT AND PLAN:  Discussed the following assessment and plan:  Essential hypertension - optimize control after getting back on meds   TSH elevation - hx of same recheck with antibody studies hx pp hypothyoid  - Plan: TSH, T4, free, T3, free, Thyroid antibodies  Medication management  Influenza vaccination declined If cough  A problem can change   -Patient advised to return or notify health care team  if symptoms worsen ,persist or new concerns arise.  Patient Instructions  Restart the  bp medication.  Take blood pressure readings twice a day for 7- 10 days and  then periodically .To ensure below 140/90   .Send in readings    After  a month back on medication  And we may adjust if not at goal.  Will notify you  of labs when available. For thyroid abnormality  Consideration of medication if needed.    ROV in  3 months or as needed and bring  In BP  monitor .Marland Kitchen   DASH Eating Plan DASH stands for "Dietary Approaches to Stop Hypertension." The DASH eating plan is a healthy eating plan that has been shown to reduce high blood pressure (hypertension). Additional health benefits may include reducing the risk of type 2 diabetes mellitus, heart disease, and stroke. The DASH eating plan may also help with weight loss. WHAT DO I NEED TO KNOW ABOUT THE DASH EATING PLAN? For the DASH eating plan, you will follow these general guidelines:  Choose foods with a percent daily value for sodium of less than 5% (as listed on the food label).  Use salt-free seasonings or herbs instead of table salt or sea salt.  Check with your health care provider or pharmacist before using salt substitutes.  Eat lower-sodium products, often labeled as "lower sodium" or "no salt added."  Eat fresh foods.  Eat more vegetables, fruits, and low-fat dairy products.  Choose whole grains. Look for the word "whole" as the first word in the ingredient list.  Choose fish and skinless chicken or [place] more often than red meat. Limit fish, poultry, and meat to 6 oz (170 g) each day.  Limit sweets, desserts, sugars, and sugary drinks.  Choose heart-healthy fats.  Limit cheese to 1 oz (28 g) per day.  Eat more home-cooked food and less restaurant, buffet, and fast food.  Limit fried foods.  Cook foods using methods other than frying.  Limit canned vegetables. If you do use them, rinse them well to decrease the sodium.  When eating at a restaurant, ask that your food be prepared with less salt, or no salt if possible. WHAT FOODS CAN I EAT? Seek help from a dietitian for  individual calorie needs. Grains Whole grain or whole wheat bread. Brown rice. Whole grain or whole wheat pasta. Quinoa, bulgur, and whole grain cereals. Low-sodium cereals. Corn or whole wheat flour tortillas. Whole grain cornbread. Whole grain crackers. Low-sodium crackers. Vegetables Fresh or frozen vegetables (raw, steamed, roasted, or grilled). Low-sodium or reduced-sodium tomato and vegetable juices. Low-sodium or reduced-sodium tomato sauce and paste. Low-sodium or reduced-sodium canned vegetables.  Fruits  All fresh, canned (in natural juice), or frozen fruits. Meat and Other Protein Products Ground beef (85% or leaner), grass-fed beef, or beef trimmed of fat. Skinless chicken or [place]. Ground chicken or [place]. Pork trimmed of fat. All fish and seafood. Eggs. Dried beans, peas, or lentils. Unsalted nuts and seeds. Unsalted canned beans. Dairy Low-fat dairy products, such as skim or 1% milk, 2% or reduced-fat cheeses, low-fat ricotta or cottage cheese, or plain low-fat yogurt. Low-sodium or reduced-sodium cheeses. Fats and Oils Tub margarines without trans fats. Light or reduced-fat mayonnaise and salad dressings (reduced sodium). Avocado. Safflower, olive, or canola oils. Natural peanut or almond butter. Other Unsalted popcorn and pretzels. The items listed above may not be a complete list of recommended foods or beverages. Contact your dietitian for more options. WHAT FOODS ARE NOT RECOMMENDED? Grains White bread. White pasta. White rice. Refined cornbread. Bagels and croissants. Crackers that contain trans fat. Vegetables Creamed or fried vegetables. Vegetables in a cheese sauce. Regular canned vegetables. Regular canned tomato sauce and paste. Regular tomato and vegetable juices. Fruits Dried fruits. Canned fruit in light or heavy syrup. Fruit juice. Meat and Other Protein Products Fatty cuts of meat. Ribs, chicken wings, bacon, sausage, bologna, salami, chitterlings, fatback, hot  dogs, bratwurst, and packaged luncheon meats. Salted nuts and seeds. Canned beans with salt. Dairy Whole or 2% milk, cream, half-and-half, and cream cheese. Whole-fat or sweetened yogurt. Full-fat cheeses or blue cheese. Nondairy creamers and whipped toppings. Processed cheese, cheese spreads, or cheese curds. Condiments Onion and garlic salt, seasoned salt, table salt, and sea salt. Canned and packaged gravies. Worcestershire sauce. Tartar sauce. Barbecue sauce. Teriyaki sauce. Soy sauce, including reduced sodium. Steak sauce. Fish sauce. Oyster sauce. Cocktail sauce. Horseradish. Ketchup and mustard. Meat flavorings and tenderizers. Bouillon cubes. Hot sauce. Tabasco sauce. Marinades. Taco seasonings. Relishes. Fats and Oils Butter, stick margarine, lard, shortening, ghee, and bacon fat. Coconut, palm kernel, or palm oils. Regular salad dressings. Other Pickles and olives. Salted popcorn and pretzels. The items listed above may not be a complete list of foods and beverages to avoid. Contact your dietitian for more information. WHERE CAN I FIND MORE INFORMATION? National Heart, Lung, and Blood Institute: travelstabloid.com Document Released: 04/20/2011 Document Revised: 09/15/2013 Document Reviewed: 03/05/2013 [facility] Patient Information 2015 [place], [place]. This information is not intended to replace advice given to you by your health care provider. Make sure you discuss any questions you have with your health care provider.       Standley Brooking. Panosh M.D.

## 2014-04-18 LAB — THYROID ANTIBODIES: Thyroperoxidase Ab SerPl-aCnc: 389 IU/mL — ABNORMAL HIGH (ref <9)

## 2014-04-19 LAB — TSH: TSH: 4.85 u[IU]/mL — AB (ref 0.35–4.50)

## 2014-04-19 LAB — T4, FREE: FREE T4: 0.61 ng/dL (ref 0.60–1.60)

## 2014-04-19 LAB — T3, FREE: T3, Free: 3.4 pg/mL (ref 2.3–4.2)

## 2014-04-20 ENCOUNTER — Telehealth: Payer: Self-pay | Admitting: Internal Medicine

## 2014-04-20 NOTE — Telephone Encounter (Signed)
emmi mailed  °

## 2014-11-04 ENCOUNTER — Ambulatory Visit: Payer: Self-pay

## 2014-11-05 ENCOUNTER — Ambulatory Visit
Admission: RE | Admit: 2014-11-05 | Discharge: 2014-11-05 | Disposition: A | Discharge status: Home / Self Care | Payer: 59 | Source: Ambulatory Visit

## 2014-11-05 DIAGNOSIS — Z1239 Encounter for other screening for malignant neoplasm of breast: Secondary | ICD-10-CM

## 2015-01-03 ENCOUNTER — Emergency Department (HOSPITAL_COMMUNITY)
Admission: EM | Admit: 2015-01-03 | Discharge: 2015-01-03 | Disposition: A | Discharge status: Home / Self Care | Payer: 59 | Attending: Emergency Medicine | Admitting: Emergency Medicine

## 2015-01-03 ENCOUNTER — Encounter (HOSPITAL_COMMUNITY): Payer: Self-pay | Admitting: *Deleted

## 2015-01-03 DIAGNOSIS — Z8669 Personal history of other diseases of the nervous system and sense organs: Secondary | ICD-10-CM | POA: Insufficient documentation

## 2015-01-03 DIAGNOSIS — Z79899 Other long term (current) drug therapy: Secondary | ICD-10-CM | POA: Diagnosis not present

## 2015-01-03 DIAGNOSIS — I10 Essential (primary) hypertension: Secondary | ICD-10-CM | POA: Insufficient documentation

## 2015-01-03 DIAGNOSIS — Z8639 Personal history of other endocrine, nutritional and metabolic disease: Secondary | ICD-10-CM | POA: Insufficient documentation

## 2015-01-03 DIAGNOSIS — K029 Dental caries, unspecified: Secondary | ICD-10-CM | POA: Diagnosis not present

## 2015-01-03 DIAGNOSIS — R590 Localized enlarged lymph nodes: Secondary | ICD-10-CM | POA: Insufficient documentation

## 2015-01-03 DIAGNOSIS — R220 Localized swelling, mass and lump, head: Secondary | ICD-10-CM | POA: Diagnosis present

## 2015-01-03 MED ORDER — AMOXICILLIN 500 MG PO CAPS
500.0000 mg | ORAL_CAPSULE | Freq: Once | ORAL | Status: AC
Start: 2015-01-03 — End: 2015-01-03
  Administered 2015-01-03: 500 mg via ORAL
  Filled 2015-01-03: qty 1

## 2015-01-03 MED ORDER — NAPROXEN 250 MG PO TABS
500.0000 mg | ORAL_TABLET | Freq: Once | ORAL | Status: AC
Start: 2015-01-03 — End: 2015-01-03
  Administered 2015-01-03: 500 mg via ORAL
  Filled 2015-01-03: qty 2

## 2015-01-03 MED ORDER — NAPROXEN 500 MG PO TABS: 500.0000 mg | ORAL_TABLET | Freq: Two times a day (BID) | ORAL | Status: DC

## 2015-01-03 MED ORDER — AMOXICILLIN 500 MG PO CAPS: 500.0000 mg | ORAL_CAPSULE | Freq: Three times a day (TID) | ORAL | Status: DC

## 2015-01-03 NOTE — ED Notes (Signed)
Pt states that on Friday her neck became tender. Over the past few days the left side of her face has been swelling. Pt has multiple broken teeth in mouth, concerned over infection.

## 2015-01-03 NOTE — ED Notes (Signed)
Discharge instructions and prescriptions reviewed

## 2015-01-03 NOTE — Discharge Instructions (Signed)

## 2015-01-03 NOTE — ED Provider Notes (Signed)
CSN: [id number]     Arrival date & time 01/03/15  1947 History   First MD Initiated Contact with Patient 01/03/15 2305     Chief Complaint  Patient presents with  . Facial Swelling    (Consider location/radiation/quality/duration/timing/severity/associated sxs/prior Treatment) HPI Comments: 49 year old female with a history of hypertension presents to the emergency department for further evaluation of left-sided facial swelling. Patient states that she noticed a soreness under her left ear 2 days ago. This area was tender to touch. She notes progressive swelling to the left side of her face extending to beneath her eye. Patient has a history of abnormal dentition. She has had some mild left upper dental pain which is temporarily relieved by Tylenol. She denies any fever, vision changes, oral bleeding, drainage from her ear, or neck stiffness. No history of dental abscesses. Patient reports that she is followed by a dentist, but that she has not seen him in some time.  The history is provided by the patient. No language interpreter was used.    Past Medical History  Diagnosis Date  . Uveitis     stable  . Hypertension   . Hx of abnormal cervical Pap smear   . History of hypothyroidism 1999    of post partum  . Colonic edema     mild left- ed eval abd ct  . Abnormal Pap smear   . Gave birth to child recently     2012  . Colonic edema    Past Surgical History  Procedure Laterality Date  . Cholecystectomy      1999  . Tubal ligation  12/31/2010    Procedure: BILATERAL TUBAL LIGATION;  Surgeon: Blane Ohara Meisinger;  Location: [facility];  Service: Gynecology;  Laterality: Bilateral;  Bilateral post partum tubal ligation   Family History  Problem Relation Age of Onset  . Hypertension Mother   . Diabetes Mother   . Thyroid disease Mother   . Diabetes Father   . Hypertension Father   . Lupus Sister   . Rheum arthritis Sister   . Other Sister     lamb disease  . Diabetes Brother   .  Diabetes Son   . Stroke Sister    Social History  Substance Use Topics  . Smoking status: Never Smoker   . Smokeless tobacco: Never Used  . Alcohol Use: No   OB History    Gravida Para Term Preterm AB TAB SAB Ectopic Multiple Living   2 2 2  0 0 0 0 0 0 2      Review of Systems  Constitutional: Negative for fever.  HENT: Positive for dental problem and facial swelling. Negative for trouble swallowing.   Hematological: Positive for adenopathy.  All other systems reviewed and are negative.   Allergies  Review of patient's allergies indicates no known allergies.  Home Medications   Prior to Admission medications   Medication Sig Start Date End Date Taking? Authorizing Provider  amLODipine (NORVASC) 5 MG tablet Take 1 tablet (5 mg total) by mouth daily. 04/17/14  Yes Burnis Medin, MD  lisinopril-hydrochlorothiazide (PRINZIDE,ZESTORETIC) 20-12.5 MG per tablet Take 1 tablet by mouth daily. 04/17/14  Yes Burnis Medin, MD  amoxicillin (AMOXIL) 500 MG capsule Take 1 capsule (500 mg total) by mouth 3 (three) times daily. 01/03/15   Antonietta Breach, PA-C  naproxen (NAPROSYN) 500 MG tablet Take 1 tablet (500 mg total) by mouth 2 (two) times daily. 01/03/15   Antonietta Breach, PA-C   BP  160/87 mmHg  Pulse 91  Temp(Src) 98.2 F (36.8 C) (Oral)  Resp 18  SpO2 97%  LMP 12/13/2014   Physical Exam  Constitutional: She is oriented to person, place, and time. She appears well-developed and well-nourished. No distress.  Nontoxic/nonseptic appearing  HENT:  Head: Normocephalic and atraumatic.  Left Ear: Hearing, tympanic membrane, external ear and ear canal normal. No mastoid tenderness. Tympanic membrane is not erythematous.  Mouth/Throat: Oropharynx is clear and moist. No oropharyngeal exudate.  Multiple decayed teeth noted in the left upper mouth; decay is extensive, down to the gum line. Swollen and mildly erythematous gingiva surrounding L upper dentition. Also dental decay noted to the L lower  molars. No trismus. Patient tolerating secretions without difficulty.  Eyes: Conjunctivae and EOM are normal. No scleral icterus.  Neck: Normal range of motion.  No nuchal rigidity or meningismus  Cardiovascular: Normal rate, regular rhythm and intact distal pulses.   Pulmonary/Chest: Effort normal. No respiratory distress.  Respirations even and unlabored  Musculoskeletal: Normal range of motion.  Lymphadenopathy:    She has cervical adenopathy.       Left cervical: Superficial cervical adenopathy present.  Neurological: She is alert and oriented to person, place, and time. She exhibits normal muscle tone. Coordination normal.  Skin: Skin is warm and dry. No rash noted. She is not diaphoretic. No erythema. No pallor.  Psychiatric: She has a normal mood and affect. Her behavior is normal.  Nursing note and vitals reviewed.   ED Course  Procedures (including critical care time) Labs Review Labs Reviewed - No data to display  Imaging Review No results found.   I have personally reviewed and evaluated these images and lab results as part of my medical decision-making.   EKG Interpretation None      MDM   Final diagnoses:  Dental decay  Left facial swelling    49 year old female with symptoms concerning for dental infection. There is mildly swollen and erythematous gingiva surrounding decayed left upper dentition. No trismus or stridor. No red flags or signs concerning for blood was angina. No nuchal rigidity or meningismus. Patient started on course of amoxicillin. Will discharge with same. Naproxen and advised for pain control as well as dental follow-up. Return precautions discussed and provided. Patient agreeable to plan with no unaddressed concerns. Patient discharged in good condition.   Filed Vitals:   01/03/15 2010 01/03/15 2320  BP: 147/94 160/87  Pulse: 103 91  Temp: 99.2 F (37.3 C) 98.2 F (36.8 C)  TempSrc: Oral Oral  Resp: 18 18  SpO2: 96% 97%      Antonietta Breach, PA-C 01/03/15 2336  Lacretia Leigh, MD 01/05/15 [id number]

## 2015-04-01 ENCOUNTER — Ambulatory Visit (INDEPENDENT_AMBULATORY_CARE_PROVIDER_SITE_OTHER)
Admission: RE | Admit: 2015-04-01 | Discharge: 2015-04-01 | Disposition: A | Discharge status: Home / Self Care | Payer: 59 | Source: Ambulatory Visit | Attending: Internal Medicine | Admitting: Internal Medicine

## 2015-04-01 ENCOUNTER — Ambulatory Visit (INDEPENDENT_AMBULATORY_CARE_PROVIDER_SITE_OTHER): Payer: 59 | Admitting: Internal Medicine

## 2015-04-01 ENCOUNTER — Encounter: Payer: Self-pay | Admitting: Internal Medicine

## 2015-04-01 VITALS — BP 146/110 | HR 101 | Temp 98.8°F | Ht 64.0 in | Wt 208.0 lb

## 2015-04-01 DIAGNOSIS — R05 Cough: Secondary | ICD-10-CM

## 2015-04-01 DIAGNOSIS — R062 Wheezing: Secondary | ICD-10-CM

## 2015-04-01 DIAGNOSIS — R059 Cough, unspecified: Secondary | ICD-10-CM

## 2015-04-01 DIAGNOSIS — I10 Essential (primary) hypertension: Secondary | ICD-10-CM | POA: Diagnosis not present

## 2015-04-01 HISTORY — DX: Wheezing: R06.2

## 2015-04-01 MED ORDER — HYDROCODONE-HOMATROPINE 5-1.5 MG/5ML PO SYRP: 5.0000 mL | ORAL_SOLUTION | Freq: Four times a day (QID) | ORAL | Status: DC | PRN

## 2015-04-01 MED ORDER — METHYLPREDNISOLONE ACETATE 80 MG/ML IJ SUSP
80.0000 mg | Freq: Once | INTRAMUSCULAR | Status: AC
  Administered 2015-04-01: 80 mg via INTRAMUSCULAR

## 2015-04-01 MED ORDER — ALBUTEROL SULFATE HFA 108 (90 BASE) MCG/ACT IN AERS: 2.0000 | INHALATION_SPRAY | Freq: Four times a day (QID) | RESPIRATORY_TRACT | Status: DC | PRN

## 2015-04-01 MED ORDER — LEVOFLOXACIN 500 MG PO TABS: 500.0000 mg | ORAL_TABLET | Freq: Every day | ORAL | Status: DC

## 2015-04-01 MED ORDER — PREDNISONE 10 MG PO TABS: ORAL_TABLET | ORAL | Status: DC

## 2015-04-01 NOTE — Progress Notes (Signed)
Pre visit review using our clinic review tool, if applicable. No additional management support is needed unless otherwise documented below in the visit note. 

## 2015-04-01 NOTE — Progress Notes (Signed)
Subjective:    Patient ID: Monica Estrada, female    DOB: [birth year], 49 y.o.   MRN: [id number]  HPI  Here with acute onset mild to mod 2-3 days ST, HA, general weakness and malaise, with prod cough greenish sputum, but Pt denies chest pain, increased sob or doe, wheezing, orthopnea, PND, increased LE swelling, palpitations, dizziness or syncope, except for onset wheezing/sob for the past day, some breathless at exam today. Pt denies new neurological symptoms such as new headache, or facial or extremity weakness or numbness   Pt denies polydipsia, polyuria Past Medical History  Diagnosis Date  . Uveitis     stable  . Hypertension   . Hx of abnormal cervical Pap smear   . History of hypothyroidism 1999    of post partum  . Colonic edema     mild left- ed eval abd ct  . Abnormal Pap smear   . Gave birth to child recently     2012  . Colonic edema    Past Surgical History  Procedure Laterality Date  . Cholecystectomy      1999  . Tubal ligation  12/31/2010    Procedure: BILATERAL TUBAL LIGATION;  Surgeon: Blane Ohara Meisinger;  Location: [facility];  Service: Gynecology;  Laterality: Bilateral;  Bilateral post partum tubal ligation    reports that she has never smoked. She has never used smokeless tobacco. She reports that she does not drink alcohol or use illicit drugs. family history includes Diabetes in her brother, father, mother, and son; Hypertension in her father and mother; Lupus in her sister; Other in her sister; Rheum arthritis in her sister; Stroke in her sister; Thyroid disease in her mother. No Known Allergies Current Outpatient Prescriptions on File Prior to Visit  Medication Sig Dispense Refill  . amLODipine (NORVASC) 5 MG tablet Take 1 tablet (5 mg total) by mouth daily. 90 tablet 3  . lisinopril-hydrochlorothiazide (PRINZIDE,ZESTORETIC) 20-12.5 MG per tablet Take 1 tablet by mouth daily. 90 tablet 3  . amoxicillin (AMOXIL) 500 MG capsule Take 1 capsule (500 mg total) by  mouth 3 (three) times daily. (Patient not taking: Reported on 04/01/2015) 29 capsule 0  . naproxen (NAPROSYN) 500 MG tablet Take 1 tablet (500 mg total) by mouth 2 (two) times daily. (Patient not taking: Reported on 04/01/2015) 30 tablet 0   No current facility-administered medications on file prior to visit.     Review of Systems  Constitutional: Negative for unusual diaphoresis or night sweats HENT: Negative for ringing in ear or discharge Eyes: Negative for double vision or worsening visual disturbance.  Respiratory: Negative for choking and stridor.   Gastrointestinal: Negative for vomiting or other signifcant bowel change Genitourinary: Negative for hematuria or change in urine volume.  Musculoskeletal: Negative for other MSK pain or swelling Skin: Negative for color change and worsening wound.  Neurological: Negative for tremors and numbness other than noted  Psychiatric/Behavioral: Negative for decreased concentration or agitation other than above       Objective:   Physical Exam BP 146/110 mmHg  Pulse 101  Temp(Src) 98.8 F (37.1 C) (Oral)  Ht 5\' 4"  (1.626 m)  Wt 208 lb (94.348 kg)  BMI 35.69 kg/m2  SpO2 92%  LMP 03/28/2015 VS noted, mild ill Constitutional: Pt appears in no significant distress HENT: Head: NCAT.  Right Ear: External ear normal.  Left Ear: External ear normal.  Eyes: . Pupils are equal, round, and reactive to light. Conjunctivae and EOM are normal Neck:  Normal range of motion. Neck supple.  Cardiovascular: Normal rate and regular rhythm.   Pulmonary/Chest: Effort normal and breath sounds without rales but with bilat wheezing, some breathless with history taking but no access muscle use, improved after neb x 2 Neurological: Pt is alert. Not confused , motor grossly intact Skin: Skin is warm. No rash, no LE edema Psychiatric: Pt behavior is normal. No agitation.     Assessment & Plan:

## 2015-04-01 NOTE — Patient Instructions (Addendum)
You had the breathing treatment (albuterol) in the office today  You had the steroid shot today  Please take all new medication as prescribed - the antibiotic, cough medicine, prednisone and inhaler  Please continue all other medications as before, and refills have been done if requested.  Please have the pharmacy call with any other refills you may need.  Please keep your appointments with your specialists as you may have planned  Please go to the XRAY Department in the Basement (go straight as you get off the elevator) for the x-ray testing  You will be contacted by phone if any changes need to be made immediately.  Otherwise, you will receive a letter about your results with an explanation, but please check with MyChart first.  Please remember to sign up for MyChart if you have not done so, as this will be important to you in the future with finding out test results, communicating by private email, and scheduling acute appointments online when needed.

## 2015-04-03 NOTE — Assessment & Plan Note (Signed)
Improved s/p neb x 2, depomedrol IM, for predpac asd, cough med prn,  to f/u any worsening symptoms or concerns

## 2015-04-03 NOTE — Assessment & Plan Note (Addendum)
Mild to mod, for antibx course as likely bronchiti vs pna, for cxr,  to f/u any worsening symptoms or concerns

## 2015-04-03 NOTE — Assessment & Plan Note (Signed)
Mild elev today likely reactive, o/w stable overall by history and exam, recent data reviewed with pt, and pt to continue medical treatment as before,  to f/u any worsening symptoms or concerns BP Readings from Last 3 Encounters:  04/01/15 146/110  01/03/15 162/81  04/17/14 134/90

## 2015-04-05 ENCOUNTER — Telehealth: Payer: Self-pay | Admitting: Internal Medicine

## 2015-04-05 NOTE — Telephone Encounter (Signed)
Pt seen Dr. Jenny Reichmann on 04/01/15 and will need to get results from his office and staff.  Informed pt.  She will call the [place] office.

## 2015-04-05 NOTE — Telephone Encounter (Signed)
Pt saw Dr Jenny Reichmann on Thursday. Is calling for her xray results please. Waited over the weekend and would like a call today.

## 2015-04-05 NOTE — Telephone Encounter (Signed)
error 

## 2015-07-02 ENCOUNTER — Encounter (HOSPITAL_COMMUNITY): Payer: Self-pay | Admitting: *Deleted

## 2015-07-02 ENCOUNTER — Emergency Department (HOSPITAL_COMMUNITY)
Admission: EM | Admit: 2015-07-02 | Discharge: 2015-07-02 | Disposition: A | Discharge status: Home / Self Care | Payer: 59 | Attending: Emergency Medicine | Admitting: Emergency Medicine

## 2015-07-02 ENCOUNTER — Emergency Department (HOSPITAL_COMMUNITY): Payer: 59

## 2015-07-02 DIAGNOSIS — M62830 Muscle spasm of back: Secondary | ICD-10-CM | POA: Diagnosis not present

## 2015-07-02 DIAGNOSIS — S3992XA Unspecified injury of lower back, initial encounter: Secondary | ICD-10-CM | POA: Insufficient documentation

## 2015-07-02 DIAGNOSIS — Y92410 Unspecified street and highway as the place of occurrence of the external cause: Secondary | ICD-10-CM | POA: Diagnosis not present

## 2015-07-02 DIAGNOSIS — S199XXA Unspecified injury of neck, initial encounter: Secondary | ICD-10-CM | POA: Diagnosis not present

## 2015-07-02 DIAGNOSIS — Z79899 Other long term (current) drug therapy: Secondary | ICD-10-CM | POA: Diagnosis not present

## 2015-07-02 DIAGNOSIS — E039 Hypothyroidism, unspecified: Secondary | ICD-10-CM | POA: Insufficient documentation

## 2015-07-02 DIAGNOSIS — Y9389 Activity, other specified: Secondary | ICD-10-CM | POA: Diagnosis not present

## 2015-07-02 DIAGNOSIS — I10 Essential (primary) hypertension: Secondary | ICD-10-CM | POA: Diagnosis not present

## 2015-07-02 DIAGNOSIS — S4991XA Unspecified injury of right shoulder and upper arm, initial encounter: Secondary | ICD-10-CM | POA: Diagnosis not present

## 2015-07-02 DIAGNOSIS — S29001A Unspecified injury of muscle and tendon of front wall of thorax, initial encounter: Secondary | ICD-10-CM | POA: Insufficient documentation

## 2015-07-02 DIAGNOSIS — S4992XA Unspecified injury of left shoulder and upper arm, initial encounter: Secondary | ICD-10-CM | POA: Diagnosis not present

## 2015-07-02 DIAGNOSIS — S29002A Unspecified injury of muscle and tendon of back wall of thorax, initial encounter: Secondary | ICD-10-CM | POA: Insufficient documentation

## 2015-07-02 DIAGNOSIS — Y998 Other external cause status: Secondary | ICD-10-CM | POA: Insufficient documentation

## 2015-07-02 DIAGNOSIS — R200 Anesthesia of skin: Secondary | ICD-10-CM | POA: Insufficient documentation

## 2015-07-02 MED ORDER — KETOROLAC TROMETHAMINE 30 MG/ML IJ SOLN
30.0000 mg | Freq: Once | INTRAMUSCULAR | Status: AC
  Administered 2015-07-02: 30 mg via INTRAMUSCULAR
  Filled 2015-07-02: qty 1

## 2015-07-02 MED ORDER — IBUPROFEN 600 MG PO TABS: 600.0000 mg | ORAL_TABLET | Freq: Four times a day (QID) | ORAL | Status: DC | PRN
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
## 2015-07-02 NOTE — ED Notes (Signed)
Pt discharged with family member and all belongings. Discharge packet given, follow-up instructions discussed, pt verbalized understanding.
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
## 2015-07-12 ENCOUNTER — Telehealth: Payer: Self-pay | Admitting: Internal Medicine
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
## 2015-07-12 NOTE — Telephone Encounter (Signed)
Ok to work in.   Make sure 30 minutes.

## 2015-07-12 NOTE — Telephone Encounter (Signed)
Left message that I called and will call back in the am. Pt is at work. Made appointment for Thursday am

## 2015-07-13 NOTE — Telephone Encounter (Signed)
Pt is aware.  

## 2015-07-15 ENCOUNTER — Ambulatory Visit (INDEPENDENT_AMBULATORY_CARE_PROVIDER_SITE_OTHER): Payer: 59 | Admitting: Internal Medicine

## 2015-07-15 ENCOUNTER — Encounter: Payer: Self-pay | Admitting: Internal Medicine

## 2015-07-15 DIAGNOSIS — R208 Other disturbances of skin sensation: Secondary | ICD-10-CM | POA: Diagnosis not present

## 2015-07-15 DIAGNOSIS — R946 Abnormal results of thyroid function studies: Secondary | ICD-10-CM

## 2015-07-15 DIAGNOSIS — R200 Anesthesia of skin: Secondary | ICD-10-CM

## 2015-07-15 DIAGNOSIS — S39012A Strain of muscle, fascia and tendon of lower back, initial encounter: Secondary | ICD-10-CM | POA: Diagnosis not present

## 2015-07-15 DIAGNOSIS — R7989 Other specified abnormal findings of blood chemistry: Secondary | ICD-10-CM

## 2015-07-15 DIAGNOSIS — I10 Essential (primary) hypertension: Secondary | ICD-10-CM | POA: Diagnosis not present

## 2015-07-15 DIAGNOSIS — S161XXA Strain of muscle, fascia and tendon at neck level, initial encounter: Secondary | ICD-10-CM | POA: Diagnosis not present
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
## 2015-07-15 NOTE — Patient Instructions (Signed)
Get fasting labs water and meds ok  i will put in orders..  Monitor BP medication and thyroid. You may benefit    From physical therapy but because of the tingling in your hands  Will arrange referral  Ortho   To decide on next step  Sometimes takes 8 weeks to resolve  Totally .    Low Back Sprain With Rehab A sprain is an injury in which a ligament is torn. The ligaments of the lower back are vulnerable to sprains. However, they are strong and require great force to be injured. These ligaments are important for stabilizing the spinal column. Sprains are classified into three categories. Grade 1 sprains cause pain, but the tendon is not lengthened. Grade 2 sprains include a lengthened ligament, due to the ligament being stretched or partially ruptured. With grade 2 sprains there is still function, although the function may be decreased. Grade 3 sprains involve a complete tear of the tendon or muscle, and function is usually impaired. SYMPTOMS   Severe pain in the lower back.  Sometimes, a feeling of a "pop," "snap," or tear, at the time of injury.  Tenderness and sometimes swelling at the injury site.  Uncommonly, bruising (contusion) within 48 hours of injury.  Muscle spasms in the back. CAUSES  Low back sprains occur when a force is placed on the ligaments that is greater than they can handle. Common causes of injury include:  Performing a stressful act while off-balance.  Repetitive stressful activities that involve movement of the lower back.  Direct hit (trauma) to the lower back. RISK INCREASES WITH:  Contact sports (football, wrestling).  Collisions (major skiing accidents).  Sports that require throwing or lifting (baseball, weightlifting).  Sports involving twisting of the spine (gymnastics, diving, tennis, golf).  Poor strength and flexibility.  Inadequate protection.  Previous back injury or surgery (especially fusion). PREVENTION  Wear properly fitted and padded  protective equipment.  Warm up and stretch properly before activity.  Allow for adequate recovery between workouts.  Maintain physical fitness:  Strength, flexibility, and endurance.  Cardiovascular fitness.  Maintain a healthy body weight. PROGNOSIS  If treated properly, low back sprains usually heal with non-surgical treatment. The length of time for healing depends on the severity of the injury.  RELATED COMPLICATIONS   Recurring symptoms, resulting in a chronic problem.  Chronic inflammation and pain in the low back.  Delayed healing or resolution of symptoms, especially if activity is resumed too soon.  Prolonged impairment.  Unstable or arthritic joints of the low back. TREATMENT  Treatment first involves the use of ice and medicine, to reduce pain and inflammation. The use of strengthening and stretching exercises may help reduce pain with activity. These exercises may be performed at home or with a therapist. Severe injuries may require referral to a therapist for further evaluation and treatment, such as ultrasound. Your caregiver may advise that you wear a back brace or corset, to help reduce pain and discomfort. Often, prolonged bed rest results in greater harm then benefit. Corticosteroid injections may be recommended. However, these should be reserved for the most serious cases. It is important to avoid using your back when lifting objects. At night, sleep on your back on a firm mattress, with a pillow placed under your knees. If non-surgical treatment is unsuccessful, surgery may be needed.  MEDICATION   If pain medicine is needed, nonsteroidal anti-inflammatory medicines (aspirin and ibuprofen), or other minor pain relievers (acetaminophen), are often advised.  Do not  take pain medicine for 7 days before surgery.  Prescription pain relievers may be given, if your caregiver thinks they are needed. Use only as directed and only as much as you need.  Ointments applied  to the skin may be helpful.  Corticosteroid injections may be given by your caregiver. These injections should be reserved for the most serious cases, because they may only be given a certain number of times. HEAT AND COLD  Cold treatment (icing) should be applied for 10 to 15 minutes every 2 to 3 hours for inflammation and pain, and immediately after activity that aggravates your symptoms. Use ice packs or an ice massage.  Heat treatment may be used before performing stretching and strengthening activities prescribed by your caregiver, physical therapist, or athletic trainer. Use a heat pack or a warm water soak. SEEK MEDICAL CARE IF:   Symptoms get worse or do not improve in 2 to 4 weeks, despite treatment.  You develop numbness or weakness in either leg.  You lose bowel or bladder function.  Any of the following occur after surgery: fever, increased pain, swelling, redness, drainage of fluids, or bleeding in the affected area.  New, unexplained symptoms develop. (Drugs used in treatment may produce side effects.) EXERCISES  RANGE OF MOTION (ROM) AND STRETCHING EXERCISES - Low Back Sprain Most people with lower back pain will find that their symptoms get worse with excessive bending forward (flexion) or arching at the lower back (extension). The exercises that will help resolve your symptoms will focus on the opposite motion.  Your physician, physical therapist or athletic trainer will help you determine which exercises will be most helpful to resolve your lower back pain. Do not complete any exercises without first consulting with your caregiver. Discontinue any exercises which make your symptoms worse, until you speak to your caregiver. If you have pain, numbness or tingling which travels down into your buttocks, leg or foot, the goal of the therapy is for these symptoms to move closer to your back and eventually resolve. Sometimes, these leg symptoms will get better, but your lower back  pain may worsen. This is often an indication of progress in your rehabilitation. Be very alert to any changes in your symptoms and the activities in which you participated in the 24 hours prior to the change. Sharing this information with your caregiver will allow him or her to most efficiently treat your condition. These exercises may help you when beginning to rehabilitate your injury. Your symptoms may resolve with or without further involvement from your physician, physical therapist or athletic trainer. While completing these exercises, remember:   Restoring tissue flexibility helps normal motion to return to the joints. This allows healthier, less painful movement and activity.  An effective stretch should be held for at least 30 seconds.  A stretch should never be painful. You should only feel a gentle lengthening or release in the stretched tissue. FLEXION RANGE OF MOTION AND STRETCHING EXERCISES: STRETCH - Flexion, Single Knee to Chest   Lie on a firm bed or floor with both legs extended in front of you.  Keeping one leg in contact with the floor, bring your opposite knee to your chest. Hold your leg in place by either grabbing behind your thigh or at your knee.  Pull until you feel a gentle stretch in your low back. Hold __________ seconds.  Slowly release your grasp and repeat the exercise with the opposite side. Repeat __________ times. Complete this exercise __________ times per day.  STRETCH - Flexion, Double Knee to Chest  Lie on a firm bed or floor with both legs extended in front of you.  Keeping one leg in contact with the floor, bring your opposite knee to your chest.  Tense your stomach muscles to support your back and then lift your other knee to your chest. Hold your legs in place by either grabbing behind your thighs or at your knees.  Pull both knees toward your chest until you feel a gentle stretch in your low back. Hold __________ seconds.  Tense your stomach  muscles and slowly return one leg at a time to the floor. Repeat __________ times. Complete this exercise __________ times per day.  STRETCH - Low Trunk Rotation  Lie on a firm bed or floor. Keeping your legs in front of you, bend your knees so they are both pointed toward the ceiling and your feet are flat on the floor.  Extend your arms out to the side. This will stabilize your upper body by keeping your shoulders in contact with the floor.  Gently and slowly drop both knees together to one side until you feel a gentle stretch in your low back. Hold for __________ seconds.  Tense your stomach muscles to support your lower back as you bring your knees back to the starting position. Repeat the exercise to the other side. Repeat __________ times. Complete this exercise __________ times per day  EXTENSION RANGE OF MOTION AND FLEXIBILITY EXERCISES: STRETCH - Extension, Prone on Elbows   Lie on your stomach on the floor, a bed will be too soft. Place your palms about shoulder width apart and at the height of your head.  Place your elbows under your shoulders. If this is too painful, stack pillows under your chest.  Allow your body to relax so that your hips drop lower and make contact more completely with the floor.  Hold this position for __________ seconds.  Slowly return to lying flat on the floor. Repeat __________ times. Complete this exercise __________ times per day.  RANGE OF MOTION - Extension, Prone Press Ups  Lie on your stomach on the floor, a bed will be too soft. Place your palms about shoulder width apart and at the height of your head.  Keeping your back as relaxed as possible, slowly straighten your elbows while keeping your hips on the floor. You may adjust the placement of your hands to maximize your comfort. As you gain motion, your hands will come more underneath your shoulders.  Hold this position __________ seconds.  Slowly return to lying flat on the  floor. Repeat __________ times. Complete this exercise __________ times per day.  RANGE OF MOTION- Quadruped, Neutral Spine   Assume a hands and knees position on a firm surface. Keep your hands under your shoulders and your knees under your hips. You may place padding under your knees for comfort.  Drop your head and point your tailbone toward the ground below you. This will round out your lower back like an angry cat. Hold this position for __________ seconds.  Slowly lift your head and release your tail bone so that your back sags into a large arch, like an old horse.  Hold this position for __________ seconds.  Repeat this until you feel limber in your low back.  Now, find your "sweet spot." This will be the most comfortable position somewhere between the two previous positions. This is your neutral spine. Once you have found this position, tense your stomach  muscles to support your low back.  Hold this position for __________ seconds. Repeat __________ times. Complete this exercise __________ times per day.  STRENGTHENING EXERCISES - Low Back Sprain These exercises may help you when beginning to rehabilitate your injury. These exercises should be done near your "sweet spot." This is the neutral, low-back arch, somewhere between fully rounded and fully arched, that is your least painful position. When performed in this safe range of motion, these exercises can be used for people who have either a flexion or extension based injury. These exercises may resolve your symptoms with or without further involvement from your physician, physical therapist or athletic trainer. While completing these exercises, remember:   Muscles can gain both the endurance and the strength needed for everyday activities through controlled exercises.  Complete these exercises as instructed by your physician, physical therapist or athletic trainer. Increase the resistance and repetitions only as guided.  You may  experience muscle soreness or fatigue, but the pain or discomfort you are trying to eliminate should never worsen during these exercises. If this pain does worsen, stop and make certain you are following the directions exactly. If the pain is still present after adjustments, discontinue the exercise until you can discuss the trouble with your caregiver. STRENGTHENING - Deep Abdominals, Pelvic Tilt   Lie on a firm bed or floor. Keeping your legs in front of you, bend your knees so they are both pointed toward the ceiling and your feet are flat on the floor.  Tense your lower abdominal muscles to press your low back into the floor. This motion will rotate your pelvis so that your tail bone is scooping upwards rather than pointing at your feet or into the floor. With a gentle tension and even breathing, hold this position for __________ seconds. Repeat __________ times. Complete this exercise __________ times per day.  STRENGTHENING - Abdominals, Crunches   Lie on a firm bed or floor. Keeping your legs in front of you, bend your knees so they are both pointed toward the ceiling and your feet are flat on the floor. Cross your arms over your chest.  Slightly tip your chin down without bending your neck.  Tense your abdominals and slowly lift your trunk high enough to just clear your shoulder blades. Lifting higher can put excessive stress on the lower back and does not further strengthen your abdominal muscles.  Control your return to the starting position. Repeat __________ times. Complete this exercise __________ times per day.  STRENGTHENING - Quadruped, Opposite UE/LE Lift   Assume a hands and knees position on a firm surface. Keep your hands under your shoulders and your knees under your hips. You may place padding under your knees for comfort.  Find your neutral spine and gently tense your abdominal muscles so that you can maintain this position. Your shoulders and hips should form a rectangle  that is parallel with the floor and is not twisted.  Keeping your trunk steady, lift your right hand no higher than your shoulder and then your left leg no higher than your hip. Make sure you are not holding your breath. Hold this position for __________ seconds.  Continuing to keep your abdominal muscles tense and your back steady, slowly return to your starting position. Repeat with the opposite arm and leg. Repeat __________ times. Complete this exercise __________ times per day.  STRENGTHENING - Abdominals and Quadriceps, Straight Leg Raise   Lie on a firm bed or floor with both legs extended  in front of you.  Keeping one leg in contact with the floor, bend the other knee so that your foot can rest flat on the floor.  Find your neutral spine, and tense your abdominal muscles to maintain your spinal position throughout the exercise.  Slowly lift your straight leg off the floor about 6 inches for a count of 15, making sure to not hold your breath.  Still keeping your neutral spine, slowly lower your leg all the way to the floor. Repeat this exercise with each leg __________ times. Complete this exercise __________ times per day. POSTURE AND BODY MECHANICS CONSIDERATIONS - Low Back Sprain Keeping correct posture when sitting, standing or completing your activities will reduce the stress put on different body tissues, allowing injured tissues a chance to heal and limiting painful experiences. The following are general guidelines for improved posture. Your physician or physical therapist will provide you with any instructions specific to your needs. While reading these guidelines, remember:  The exercises prescribed by your provider will help you have the flexibility and strength to maintain correct postures.  The correct posture provides the best environment for your joints to work. All of your joints have less wear and tear when properly supported by a spine with good posture. This means you  will experience a healthier, less painful body.  Correct posture must be practiced with all of your activities, especially prolonged sitting and standing. Correct posture is as important when doing repetitive low-stress activities (typing) as it is when doing a single heavy-load activity (lifting). RESTING POSITIONS Consider which positions are most painful for you when choosing a resting position. If you have pain with flexion-based activities (sitting, bending, stooping, squatting), choose a position that allows you to rest in a less flexed posture. You would want to avoid curling into a fetal position on your side. If your pain worsens with extension-based activities (prolonged standing, working overhead), avoid resting in an extended position such as sleeping on your stomach. Most people will find more comfort when they rest with their spine in a more neutral position, neither too rounded nor too arched. Lying on a non-sagging bed on your side with a pillow between your knees, or on your back with a pillow under your knees will often provide some relief. Keep in mind, being in any one position for a prolonged period of time, no matter how correct your posture, can still lead to stiffness. PROPER SITTING POSTURE In order to minimize stress and discomfort on your spine, you must sit with correct posture. Sitting with good posture should be effortless for a healthy body. Returning to good posture is a gradual process. Many people can work toward this most comfortably by using various supports until they have the flexibility and strength to maintain this posture on their own. When sitting with proper posture, your ears will fall over your shoulders and your shoulders will fall over your hips. You should use the back of the chair to support your upper back. Your lower back will be in a neutral position, just slightly arched. You may place a small pillow or folded towel at the base of your lower back for    support.  When working at a desk, create an environment that supports good, upright posture. Without extra support, muscles tire, which leads to excessive strain on joints and other tissues. Keep these recommendations in mind: CHAIR:  A chair should be able to slide under your desk when your back makes contact with the  back of the chair. This allows you to work closely.  The chair's height should allow your eyes to be level with the upper part of your monitor and your hands to be slightly lower than your elbows. BODY POSITION  Your feet should make contact with the floor. If this is not possible, use a foot rest.  Keep your ears over your shoulders. This will reduce stress on your neck and low back. INCORRECT SITTING POSTURES  If you are feeling tired and unable to assume a healthy sitting posture, do not slouch or slump. This puts excessive strain on your back tissues, causing more damage and pain. Healthier options include:  Using more support, like a lumbar pillow.  Switching tasks to something that requires you to be upright or walking.  Talking a brief walk.  Lying down to rest in a neutral-spine position. PROLONGED STANDING WHILE SLIGHTLY LEANING FORWARD  When completing a task that requires you to lean forward while standing in one place for a long time, place either foot up on a stationary 2-4 inch high object to help maintain the best posture. When both feet are on the ground, the lower back tends to lose its slight inward curve. If this curve flattens (or becomes too large), then the back and your other joints will experience too much stress, tire more quickly, and can cause pain. CORRECT STANDING POSTURES Proper standing posture should be assumed with all daily activities, even if they only take a few moments, like when brushing your teeth. As in sitting, your ears should fall over your shoulders and your shoulders should fall over your hips. You should keep a slight tension in  your abdominal muscles to brace your spine. Your tailbone should point down to the ground, not behind your body, resulting in an over-extended swayback posture.  INCORRECT STANDING POSTURES  Common incorrect standing postures include a forward head, locked knees and/or an excessive swayback. WALKING Walk with an upright posture. Your ears, shoulders and hips should all line-up. PROLONGED ACTIVITY IN A FLEXED POSITION When completing a task that requires you to bend forward at your waist or lean over a low surface, try to find a way to stabilize 3 out of 4 of your limbs. You can place a hand or elbow on your thigh or rest a knee on the surface you are reaching across. This will provide you more stability, so that your muscles do not tire as quickly. By keeping your knees relaxed, or slightly bent, you will also reduce stress across your lower back. CORRECT LIFTING TECHNIQUES DO :  Assume a wide stance. This will provide you more stability and the opportunity to get as close as possible to the object which you are lifting.  Tense your abdominals to brace your spine. Bend at the knees and hips. Keeping your back locked in a neutral-spine position, lift using your leg muscles. Lift with your legs, keeping your back straight.  Test the weight of unknown objects before attempting to lift them.  Try to keep your elbows locked down at your sides in order get the best strength from your shoulders when carrying an object.  Always ask for help when lifting heavy or awkward objects. INCORRECT LIFTING TECHNIQUES DO NOT:   Lock your knees when lifting, even if it is a small object.  Bend and twist. Pivot at your feet or move your feet when needing to change directions.  Assume that you can safely pick up even a paperclip without proper  posture.   This information is not intended to replace advice given to you by your health care provider. Make sure you discuss any questions you have with your health  care provider.   Document Released: 05/01/2005 Document Revised: 05/22/2014 Document Reviewed: 08/13/2008 [employer] Interactive Patient Education 2016 [employer]    Cervical Sprain A cervical sprain is an injury in the neck in which the strong, fibrous tissues (ligaments) that connect your neck bones stretch or tear. Cervical sprains can range from mild to severe. Severe cervical sprains can cause the neck vertebrae to be unstable. This can lead to damage of the spinal cord and can result in serious nervous system problems. The amount of time it takes for a cervical sprain to get better depends on the cause and extent of the injury. Most cervical sprains heal in 1 to 3 weeks. CAUSES  Severe cervical sprains may be caused by:   Contact sport injuries (such as from football, rugby, wrestling, hockey, auto racing, gymnastics, diving, martial arts, or boxing).   Motor vehicle collisions.   Whiplash injuries. This is an injury from a sudden forward and backward whipping movement of the head and neck.  Falls.  Mild cervical sprains may be caused by:   Being in an awkward position, such as while cradling a telephone between your ear and shoulder.   Sitting in a chair that does not offer proper support.   Working at a poorly Landscape [employer] station.   Looking up or down for long periods of time.  SYMPTOMS   Pain, soreness, stiffness, or a burning sensation in the front, back, or sides of the neck. This discomfort may develop immediately after the injury or slowly, 24 hours or more after the injury.   Pain or tenderness directly in the middle of the back of the neck.   Shoulder or upper back pain.   Limited ability to move the neck.   Headache.   Dizziness.   Weakness, numbness, or tingling in the hands or arms.   Muscle spasms.   Difficulty swallowing or chewing.   Tenderness and swelling of the neck.  DIAGNOSIS  Most of the time your health care  provider can diagnose a cervical sprain by taking your history and doing a physical exam. Your health care provider will ask about previous neck injuries and any known neck problems, such as arthritis in the neck. X-rays may be taken to find out if there are any other problems, such as with the bones of the neck. Other tests, such as a CT scan or MRI, may also be needed.  TREATMENT  Treatment depends on the severity of the cervical sprain. Mild sprains can be treated with rest, keeping the neck in place (immobilization), and pain medicines. Severe cervical sprains are immediately immobilized. Further treatment is done to help with pain, muscle spasms, and other symptoms and may include:  Medicines, such as pain relievers, numbing medicines, or muscle relaxants.   Physical therapy. This may involve stretching exercises, strengthening exercises, and posture training. Exercises and improved posture can help stabilize the neck, strengthen muscles, and help stop symptoms from returning.  HOME CARE INSTRUCTIONS   Put ice on the injured area.   Put ice in a plastic bag.   Place a towel between your skin and the bag.   Leave the ice on for 15-20 minutes, 3-4 times a day.   If your injury was severe, you may have been given a cervical collar to wear. A cervical  collar is a two-piece collar designed to keep your neck from moving while it heals.  Do not remove the collar unless instructed by your health care provider.  If you have long hair, keep it outside of the collar.  Ask your health care provider before making any adjustments to your collar. Minor adjustments may be required over time to improve comfort and reduce pressure on your chin or on the back of your head.  Ifyou are allowed to remove the collar for cleaning or bathing, follow your health care provider's instructions on how to do so safely.  Keep your collar clean by wiping it with mild soap and water and drying it completely. If  the collar you have been given includes removable pads, remove them every 1-2 days and hand wash them with soap and water. Allow them to air dry. They should be completely dry before you wear them in the collar.  If you are allowed to remove the collar for cleaning and bathing, wash and dry the skin of your neck. Check your skin for irritation or sores. If you see any, tell your health care provider.  Do not drive while wearing the collar.   Only take over-the-counter or prescription medicines for pain, discomfort, or fever as directed by your health care provider.   Keep all follow-up appointments as directed by your health care provider.   Keep all physical therapy appointments as directed by your health care provider.   Make any needed adjustments to your workstation to promote good posture.   Avoid positions and activities that make your symptoms worse.   Warm up and stretch before being active to help prevent problems.  SEEK MEDICAL CARE IF:   Your pain is not controlled with medicine.   You are unable to decrease your pain medicine over time as planned.   Your activity level is not improving as expected.  SEEK IMMEDIATE MEDICAL CARE IF:   You develop any bleeding.  You develop stomach upset.  You have signs of an allergic reaction to your medicine.   Your symptoms get worse.   You develop new, unexplained symptoms.   You have numbness, tingling, weakness, or paralysis in any part of your body.  MAKE SURE YOU:   Understand these instructions.  Will watch your condition.  Will get help right away if you are not doing well or get worse.   This information is not intended to replace advice given to you by your health care provider. Make sure you discuss any questions you have with your health care provider.   Document Released: 02/26/2007 Document Revised: 05/06/2013 Document Reviewed: 11/06/2012 Elsevier Interactive Patient Education [employer].

## 2015-09-28 DIAGNOSIS — H21541 Posterior synechiae (iris), right eye: Secondary | ICD-10-CM | POA: Insufficient documentation

## 2015-09-28 DIAGNOSIS — H2513 Age-related nuclear cataract, bilateral: Secondary | ICD-10-CM | POA: Insufficient documentation

## 2015-11-29 ENCOUNTER — Other Ambulatory Visit: Payer: Self-pay | Admitting: Internal Medicine

## 2015-12-07 DIAGNOSIS — H35033 Hypertensive retinopathy, bilateral: Secondary | ICD-10-CM | POA: Insufficient documentation

## 2015-12-22 ENCOUNTER — Other Ambulatory Visit: Payer: Self-pay | Admitting: Internal Medicine

## 2015-12-22 DIAGNOSIS — Z1231 Encounter for screening mammogram for malignant neoplasm of breast: Secondary | ICD-10-CM

## 2015-12-30 ENCOUNTER — Ambulatory Visit
Admission: RE | Admit: 2015-12-30 | Discharge: 2015-12-30 | Disposition: A | Discharge status: Home / Self Care | Payer: 59 | Source: Ambulatory Visit | Attending: Internal Medicine | Admitting: Internal Medicine

## 2015-12-30 DIAGNOSIS — Z1231 Encounter for screening mammogram for malignant neoplasm of breast: Secondary | ICD-10-CM

## 2016-03-01 LAB — HM PAP SMEAR

## 2016-03-02 ENCOUNTER — Encounter: Payer: Self-pay | Admitting: Family Medicine

## 2016-03-03 ENCOUNTER — Other Ambulatory Visit (INDEPENDENT_AMBULATORY_CARE_PROVIDER_SITE_OTHER): Payer: 59

## 2016-03-03 DIAGNOSIS — R7989 Other specified abnormal findings of blood chemistry: Secondary | ICD-10-CM

## 2016-03-03 DIAGNOSIS — I10 Essential (primary) hypertension: Secondary | ICD-10-CM

## 2016-03-03 DIAGNOSIS — R200 Anesthesia of skin: Secondary | ICD-10-CM | POA: Diagnosis not present

## 2016-03-03 NOTE — Addendum Note (Signed)
Addended by: Gari Crown D on: 03/03/2016 03:52 PM   Modules accepted: Orders

## 2016-03-04 LAB — CBC WITH DIFFERENTIAL/PLATELET
BASOS PCT: 1 %
Basophils Absolute: 47 cells/uL (ref 0–200)
EOS ABS: 141 {cells}/uL (ref 15–500)
EOS PCT: 3 %
HCT: 35.9 % (ref 35.0–45.0)
Hemoglobin: 10.8 g/dL — ABNORMAL LOW (ref 11.7–15.5)
LYMPHS PCT: 45 %
Lymphs Abs: 2115 cells/uL (ref 850–3900)
MCH: 21.8 pg — ABNORMAL LOW (ref 27.0–33.0)
MCHC: 30.1 g/dL — ABNORMAL LOW (ref 32.0–36.0)
MCV: 72.4 fL — AB (ref 80.0–100.0)
MONOS PCT: 10 %
Monocytes Absolute: 470 cells/uL (ref 200–950)
Neutro Abs: 1927 cells/uL (ref 1500–7800)
Neutrophils Relative %: 41 %
PLATELETS: 262 10*3/uL (ref 140–400)
RBC: 4.96 MIL/uL (ref 3.80–5.10)
RDW: 15.8 % — AB (ref 11.0–15.0)
WBC: 4.7 10*3/uL (ref 3.8–10.8)

## 2016-03-04 LAB — HEPATIC FUNCTION PANEL
ALT: 13 U/L (ref 6–29)
AST: 18 U/L (ref 10–35)
Albumin: 4.1 g/dL (ref 3.6–5.1)
Alkaline Phosphatase: 62 U/L (ref 33–130)
BILIRUBIN INDIRECT: 0.2 mg/dL (ref 0.2–1.2)
BILIRUBIN TOTAL: 0.3 mg/dL (ref 0.2–1.2)
Bilirubin, Direct: 0.1 mg/dL (ref <=0.2)
TOTAL PROTEIN: 7.5 g/dL (ref 6.1–8.1)

## 2016-03-04 LAB — BASIC METABOLIC PANEL
BUN: 13 mg/dL (ref 7–25)
CALCIUM: 9.9 mg/dL (ref 8.6–10.4)
CO2: 30 mmol/L (ref 20–31)
CREATININE: 0.95 mg/dL (ref 0.50–1.05)
Chloride: 104 mmol/L (ref 98–110)
GLUCOSE: 87 mg/dL (ref 65–99)
Potassium: 3.8 mmol/L (ref 3.5–5.3)
Sodium: 141 mmol/L (ref 135–146)

## 2016-03-04 LAB — TSH: TSH: 3.61 m[IU]/L

## 2016-03-04 LAB — T4, FREE: Free T4: 0.7 ng/dL — ABNORMAL LOW (ref 0.8–1.8)

## 2016-03-09 ENCOUNTER — Telehealth: Payer: Self-pay | Admitting: Internal Medicine

## 2016-03-09 NOTE — Telephone Encounter (Signed)
Pt would like a call back to discuss what to do about some her her labs that were out of range.

## 2016-03-09 NOTE — Telephone Encounter (Signed)
Spoke to pt, told her Chemistry and Liver are fine but is anemic, thyroid is borderline. She would like you to make an appt to discuss results. Pt verbalized understanding and transferred to scheduling.

## 2016-03-09 NOTE — Telephone Encounter (Signed)
° °  Pt would like a call back to discuss labs
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
## 2016-03-13 ENCOUNTER — Encounter: Payer: Self-pay | Admitting: Internal Medicine

## 2016-03-13 ENCOUNTER — Ambulatory Visit (INDEPENDENT_AMBULATORY_CARE_PROVIDER_SITE_OTHER): Payer: 59 | Admitting: Internal Medicine

## 2016-03-13 VITALS — BP 134/86 | Temp 98.4°F | Wt 211.0 lb

## 2016-03-13 DIAGNOSIS — I10 Essential (primary) hypertension: Secondary | ICD-10-CM | POA: Diagnosis not present

## 2016-03-13 DIAGNOSIS — D509 Iron deficiency anemia, unspecified: Secondary | ICD-10-CM

## 2016-03-13 DIAGNOSIS — R946 Abnormal results of thyroid function studies: Secondary | ICD-10-CM | POA: Diagnosis not present

## 2016-03-13 DIAGNOSIS — Z79899 Other long term (current) drug therapy: Secondary | ICD-10-CM

## 2016-03-13 DIAGNOSIS — R7989 Other specified abnormal findings of blood chemistry: Secondary | ICD-10-CM

## 2016-03-13 MED ORDER — ALBUTEROL SULFATE HFA 108 (90 BASE) MCG/ACT IN AERS: 2.0000 | INHALATION_SPRAY | Freq: Four times a day (QID) | RESPIRATORY_TRACT | 1 refills | Status: DC | PRN

## 2016-03-13 NOTE — Progress Notes (Signed)
Pre visit review using our clinic review tool, if applicable. No additional management support is needed unless otherwise documented below in the visit note. 

## 2016-03-13 NOTE — Progress Notes (Signed)
Chief Complaint  Patient presents with  . Follow-up    labs anemia etc     HPI: Monica Estrada 50 y.o. comes in for follow-up of abnormal blood work. Generally she is feeling well on her antihypertensive medications without side effects. She is still having regular periods lasting 3-5 days but become more heavy with clotting. Does see her gynecologist on a regular basis and no new diagnoses. Denies any blood from the stool polyps and she has had a colonoscopy a couple years ago on a 10 year recall. She thinks her anemia is iron deficiency and is just started taking an iron pill twice a day. No chest pain shortness of breath is just beginning to exercise and going back to the gym. Family history of thyroid disease in mother is on thyroid replacement. See past history with elevated TSH in the past. ROS: See pertinent positives and negatives per HPI.no nfever weith loss bleeding   Rare use of albuterol but would like a refill on hand in case needed.  Past Medical History:  Diagnosis Date  . Abnormal Pap smear   . Colonic edema    mild left- ed eval abd ct  . Colonic edema   . Gave birth to child recently    2012  . History of hypothyroidism 1999   of post partum  . Hx of abnormal cervical Pap smear   . Hypertension   . Uveitis    stable    Family History  Problem Relation Age of Onset  . Hypertension Mother   . Diabetes Mother   . Thyroid disease Mother   . Diabetes Father   . Hypertension Father   . Lupus Sister   . Rheum arthritis Sister   . Other Sister     lamb disease  . Diabetes Brother   . Diabetes Son   . Stroke Sister     Social History   Social History  . Marital status: Single    Spouse name: N/A  . Number of children: N/A  . Years of education: N/A   Social History Main Topics  . Smoking status: Never Smoker  . Smokeless tobacco: Never Used  . Alcohol use No  . Drug use: No  . Sexual activity: Not Currently   Other Topics Concern  . None     Social History Narrative   [name]  ABOUT 58 HOURS    Single   HH of 3 son  37 y  And baby girl 2 year  Father out of country comes back ocass. Some financial support .    Father  In [place].   No pets.    Neg ets .    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 amLODipine (NORVASC) 5 MG tablet TAKE 1 TABLET (5 MG TOTAL) BY MOUTH DAILY. 90 tablet 3  . ibuprofen (ADVIL,MOTRIN) 600 MG tablet Take 1 tablet (600 mg total) by mouth every 6 (six) hours as needed for moderate pain. 30 tablet 0  . lisinopril-hydrochlorothiazide (PRINZIDE,ZESTORETIC) 20-12.5 MG tablet TAKE 1 TABLET BY MOUTH DAILY. 90 tablet 3  . albuterol (PROVENTIL HFA;VENTOLIN HFA) 108 (90 BASE) MCG/ACT inhaler Inhale 2 puffs into the lungs every 6 (six) hours as needed for wheezing or shortness of breath. (Patient not taking: Reported on 03/13/2016) 1 Inhaler 1  No facility-administered medications prior to visit.      EXAM:  BP 134/86 (BP Location: Right Arm, Patient Position: Sitting, Cuff Size: Large)   Temp 98.4 F (36.9 C) (Oral)   Wt 211 lb (95.7 kg)   BMI 37.38 kg/m   Body mass index is 37.38 kg/m.  GENERAL: vitals reviewed and listed above, alert, oriented, appears well hydrated and in no acute distress HEENT: atraumatic, conjunctiva  clear, no obvious abnormalities on inspection of external nose and ears  NECK: no obvious masses on inspection palpation palpable thyroid  LUNGS: clear to auscultation bilaterally, no wheezes, rales or rhonchi, good air movement CV: HRRR, no g or murmur  no clubbing cyanosis or  peripheral edema nl cap refill  MS: moves all extremities without noticeable focal  abnormality PSYCH: pleasant and cooperative, no obvious depression or anxiety Skin no bruising bleeding petechia  ASSESSMENT AND PLAN:  Discussed the following assessment and plan:  Microcytic anemia - presumediron def from  menesl loss  Essential hypertension  Medication management  Abnormal thyroid blood test - low t4 nl tsh this time  fam hx  check tsh free t4 adn antibodies next lab Total visit 93mns > 50% spent counseling and coordinating care as indicated in above note and in instructions to patient .  Ok to refill albuterol x 1 to have on hand  -Patient advised to return or notify health care team  if symptoms worsen ,persist or new concerns arise.  Patient Instructions   I agree your anemia is most likely from low iron probably losing blood and iron from your periods. Take an iron pill such as ferrous sulfate once or twice a day with vitamin C 250 mg will help absorption. Increase iron rich foods. Your thyroid is borderline off I want to repeat these lab tests with a special antibody test in 2 months when we recheck your iron level. Let [facility] if you're having any blood in your stool or problems with bleeding. Your blood pressure is good today as well as her blood sugar and kidney function. Okay to exercise as we discussed start slow work your way up if you're having any concerns or symptoms get back with [facility] [facility] work in 2 months next visit depending on results.   Iron-Rich Diet Iron is a mineral that helps your body to produce hemoglobin. Hemoglobin is a protein in your red blood cells that carries oxygen to your body's tissues. Eating too little iron may cause you to feel weak and tired, and it can increase your risk for infection. Eating enough iron is necessary for your body's metabolism, muscle function, and nervous system. Iron is naturally found in many foods. It can also be added to foods or fortified in foods. There are two types of dietary iron:  Heme iron. Heme iron is absorbed by the body more easily than nonheme iron. Heme iron is found in meat, poultry, and fish.  Nonheme iron. Nonheme iron is found in dietary supplements, iron-fortified grains, beans, and vegetables. You may need to  follow an iron-rich diet if:  You have been diagnosed with iron deficiency or iron-deficiency anemia.  You have a condition that prevents you from absorbing dietary iron, such as:  Infection in your intestines.  Celiac disease. This involves long-lasting (chronic) inflammation of your intestines.  You do not eat enough iron.  You eat a diet that is high in foods that impair iron absorption.  You have lost a lot of blood.  You  have heavy bleeding during your menstrual cycle.  You are pregnant. WHAT IS MY PLAN? Your health care provider may help you to determine how much iron you need per day based on your condition. Generally, when a person consumes sufficient amounts of iron in the diet, the following iron needs are met:  Men.  37-15 years old: 11 mg per day.  46-9 years old: 8 mg per day.  Women.   81-61 years old: 15 mg per day.  25-70 years old: 18 mg per day.  Over 25 years old: 8 mg per day.  Pregnant women: 27 mg per day.  Breastfeeding women: 9 mg per day. WHAT DO I NEED TO KNOW ABOUT AN IRON-RICH DIET?  Eat fresh fruits and vegetables that are high in vitamin C along with foods that are high in iron. This will help increase the amount of iron that your body absorbs from food, especially with foods containing nonheme iron. Foods that are high in vitamin C include oranges, peppers, tomatoes, and mango.  Take iron supplements only as directed by your health care provider. Overdose of iron can be life-threatening. If you were prescribed iron supplements, take them with orange juice or a vitamin C supplement.  Cook foods in pots and pans that are made from iron.   Eat nonheme iron-containing foods alongside foods that are high in heme iron. This helps to improve your iron absorption.   Certain foods and drinks contain compounds that impair iron absorption. Avoid eating these foods in the same meal as iron-rich foods or with iron supplements. These  include:  Coffee, black tea, and red wine.  Milk, dairy products, and foods that are high in calcium.  Beans, soybeans, and peas.  Whole grains.  When eating foods that contain both nonheme iron and compounds that impair iron absorption, follow these tips to absorb iron better.   Soak beans overnight before cooking.  Soak whole grains overnight and drain them before using.  Ferment flours before baking, such as using yeast in bread dough. WHAT FOODS CAN I EAT? Grains Iron-fortified breakfast cereal. Iron-fortified whole-wheat bread. Enriched rice. Sprouted grains. Vegetables Spinach. Potatoes with skin. Green peas. Broccoli. Red and green bell peppers. Fermented vegetables. Fruits Prunes. Raisins. Oranges. Strawberries. Mango. Grapefruit. Meats and Other Protein Sources Beef liver. Oysters. Beef. Shrimp. [place]. Chicken. [place]. Sardines. Chickpeas. Nuts. Tofu. Beverages Tomato juice. Fresh orange juice. Prune juice. Hibiscus tea. Fortified instant breakfast shakes. Condiments Tahini. Fermented soy sauce. Sweets and Desserts Black-strap molasses.  Other Wheat germ. The items listed above may not be a complete list of recommended foods or beverages. Contact your dietitian for more options. WHAT FOODS ARE NOT RECOMMENDED? Grains Whole grains. Bran cereal. Bran flour. Oats. Vegetables Artichokes. Brussels sprouts. Kale. Fruits Blueberries. Raspberries. Strawberries. Figs. Meats and Other Protein Sources Soybeans. Products made from soy protein. Dairy Milk. Cream. Cheese. Yogurt. Cottage cheese. Beverages Coffee. Black tea. Red wine. Sweets and Desserts Cocoa. Chocolate. Ice cream. Other Basil. Oregano. Parsley. The items listed above may not be a complete list of foods and beverages to avoid. Contact your dietitian for more information.   This information is not intended to replace advice given to you by your health care provider. Make sure you  discuss any questions you have with your health care provider.   Document Released: 12/13/2004 Document Revised: 05/22/2014 Document Reviewed: 11/26/2013 Elsevier Interactive Patient Education 2016 [place] K. Panosh M.D.

## 2016-03-13 NOTE — Patient Instructions (Signed)
I agree your anemia is most likely from low iron probably losing blood and iron from your periods. Take an iron pill such as ferrous sulfate once or twice a day with vitamin C 250 mg will help absorption. Increase iron rich foods. Your thyroid is borderline off I want to repeat these lab tests with a special antibody test in 2 months when we recheck your iron level. Let us know if you're having any blood in your stool or problems with bleeding. Your blood pressure is good today as well as her blood sugar and kidney function. Okay to exercise as we discussed start slow work your way up if you're having any concerns or symptoms get back with [place]. Lab work in 2 months next visit depending on results.   Iron-Rich Diet Iron is a mineral that helps your body to produce hemoglobin. Hemoglobin is a protein in your red blood cells that carries oxygen to your body's tissues. Eating too little iron may cause you to feel weak and tired, and it can increase your risk for infection. Eating enough iron is necessary for your body's metabolism, muscle function, and nervous system. Iron is naturally found in many foods. It can also be added to foods or fortified in foods. There are two types of dietary iron:  Heme iron. Heme iron is absorbed by the body more easily than nonheme iron. Heme iron is found in meat, poultry, and fish.  Nonheme iron. Nonheme iron is found in dietary supplements, iron-fortified grains, beans, and vegetables. You may need to follow an iron-rich diet if:  You have been diagnosed with iron deficiency or iron-deficiency anemia.  You have a condition that prevents you from absorbing dietary iron, such as:  Infection in your intestines.  Celiac disease. This involves long-lasting (chronic) inflammation of your intestines.  You do not eat enough iron.  You eat a diet that is high in foods that impair iron absorption.  You have lost a lot of blood.  You have heavy bleeding during  your menstrual cycle.  You are pregnant. WHAT IS MY PLAN? Your health care provider may help you to determine how much iron you need per day based on your condition. Generally, when a person consumes sufficient amounts of iron in the diet, the following iron needs are met:  Men.  71-69 years old: 11 mg per day.  76-43 years old: 8 mg per day.  Women.   59-35 years old: 15 mg per day.  90-65 years old: 18 mg per day.  Over 57 years old: 8 mg per day.  Pregnant women: 27 mg per day.  Breastfeeding women: 9 mg per day. WHAT DO I NEED TO KNOW ABOUT AN IRON-RICH DIET?  Eat fresh fruits and vegetables that are high in vitamin C along with foods that are high in iron. This will help increase the amount of iron that your body absorbs from food, especially with foods containing nonheme iron. Foods that are high in vitamin C include oranges, peppers, tomatoes, and mango.  Take iron supplements only as directed by your health care provider. Overdose of iron can be life-threatening. If you were prescribed iron supplements, take them with orange juice or a vitamin C supplement.  Cook foods in pots and pans that are made from iron.   Eat nonheme iron-containing foods alongside foods that are high in heme iron. This helps to improve your iron absorption.   Certain foods and drinks contain compounds that impair iron absorption. Avoid eating  these foods in the same meal as iron-rich foods or with iron supplements. These include:  Coffee, black tea, and red wine.  Milk, dairy products, and foods that are high in calcium.  Beans, soybeans, and peas.  Whole grains.  When eating foods that contain both nonheme iron and compounds that impair iron absorption, follow these tips to absorb iron better.   Soak beans overnight before cooking.  Soak whole grains overnight and drain them before using.  Ferment flours before baking, such as using yeast in bread dough. WHAT FOODS CAN I  EAT? Grains Iron-fortified breakfast cereal. Iron-fortified whole-wheat bread. Enriched rice. Sprouted grains. Vegetables Spinach. Potatoes with skin. Green peas. Broccoli. Red and green bell peppers. Fermented vegetables. Fruits Prunes. Raisins. Oranges. Strawberries. Mango. Grapefruit. Meats and Other Protein Sources Beef liver. Oysters. Beef. Shrimp. [place]. Chicken. [place]. Sardines. Chickpeas. Nuts. Tofu. Beverages Tomato juice. Fresh orange juice. Prune juice. Hibiscus tea. Fortified instant breakfast shakes. Condiments Tahini. Fermented soy sauce. Sweets and Desserts Black-strap molasses.  Other Wheat germ. The items listed above may not be a complete list of recommended foods or beverages. Contact your dietitian for more options. WHAT FOODS ARE NOT RECOMMENDED? Grains Whole grains. Bran cereal. Bran flour. Oats. Vegetables Artichokes. Brussels sprouts. Kale. Fruits Blueberries. Raspberries. Strawberries. Figs. Meats and Other Protein Sources Soybeans. Products made from soy protein. Dairy Milk. Cream. Cheese. Yogurt. Cottage cheese. Beverages Coffee. Black tea. Red wine. Sweets and Desserts Cocoa. Chocolate. Ice cream. Other Basil. Oregano. Parsley. The items listed above may not be a complete list of foods and beverages to avoid. Contact your dietitian for more information.   This information is not intended to replace advice given to you by your health care provider. Make sure you discuss any questions you have with your health care provider.   Document Released: 12/13/2004 Document Revised: 05/22/2014 Document Reviewed: 11/26/2013 Elsevier Interactive Patient Education [employer].

## 2016-03-13 NOTE — Assessment & Plan Note (Signed)
Microcytic presumed  Iron deficient   Still reg menses

## 2016-07-15 ENCOUNTER — Emergency Department (HOSPITAL_COMMUNITY): Payer: 59

## 2016-07-15 ENCOUNTER — Encounter (HOSPITAL_COMMUNITY): Payer: Self-pay | Admitting: Emergency Medicine

## 2016-07-15 ENCOUNTER — Emergency Department (HOSPITAL_COMMUNITY)
Admission: EM | Admit: 2016-07-15 | Discharge: 2016-07-15 | Disposition: A | Discharge status: Home / Self Care | Payer: 59 | Attending: Emergency Medicine | Admitting: Emergency Medicine

## 2016-07-15 DIAGNOSIS — I10 Essential (primary) hypertension: Secondary | ICD-10-CM | POA: Insufficient documentation

## 2016-07-15 DIAGNOSIS — J988 Other specified respiratory disorders: Secondary | ICD-10-CM

## 2016-07-15 DIAGNOSIS — Z79899 Other long term (current) drug therapy: Secondary | ICD-10-CM | POA: Insufficient documentation

## 2016-07-15 DIAGNOSIS — R062 Wheezing: Secondary | ICD-10-CM | POA: Diagnosis not present

## 2016-07-15 DIAGNOSIS — R0602 Shortness of breath: Secondary | ICD-10-CM | POA: Diagnosis present

## 2016-07-15 DIAGNOSIS — J40 Bronchitis, not specified as acute or chronic: Secondary | ICD-10-CM | POA: Insufficient documentation

## 2016-07-15 LAB — I-STAT TROPONIN, ED
TROPONIN I, POC: 0 ng/mL (ref 0.00–0.08)
Troponin i, poc: 0 ng/mL (ref 0.00–0.08)

## 2016-07-15 LAB — BASIC METABOLIC PANEL
ANION GAP: 7 (ref 5–15)
BUN: 6 mg/dL (ref 6–20)
CALCIUM: 9.8 mg/dL (ref 8.9–10.3)
CHLORIDE: 105 mmol/L (ref 101–111)
CO2: 25 mmol/L (ref 22–32)
Creatinine, Ser: 0.84 mg/dL (ref 0.44–1.00)
GFR calc non Af Amer: >60 mL/min (ref >60)
Glucose, Bld: 135 mg/dL — ABNORMAL HIGH (ref 65–99)
POTASSIUM: 3.5 mmol/L (ref 3.5–5.1)
Sodium: 137 mmol/L (ref 135–145)

## 2016-07-15 LAB — HEPATIC FUNCTION PANEL
ALBUMIN: 3.8 g/dL (ref 3.5–5.0)
ALT: 17 U/L (ref 14–54)
AST: 23 U/L (ref 15–41)
Alkaline Phosphatase: 58 U/L (ref 38–126)
BILIRUBIN TOTAL: 0.5 mg/dL (ref 0.3–1.2)
Bilirubin, Direct: <0.1 mg/dL — ABNORMAL LOW (ref 0.1–0.5)
Total Protein: 7.1 g/dL (ref 6.5–8.1)

## 2016-07-15 LAB — CBC
HCT: 38.7 % (ref 36.0–46.0)
HEMOGLOBIN: 13 g/dL (ref 12.0–15.0)
MCH: 26.1 pg (ref 26.0–34.0)
MCHC: 33.6 g/dL (ref 30.0–36.0)
MCV: 77.7 fL — ABNORMAL LOW (ref 78.0–100.0)
Platelets: 182 10*3/uL (ref 150–400)
RBC: 4.98 MIL/uL (ref 3.87–5.11)
RDW: 13.7 % (ref 11.5–15.5)
WBC: 5 10*3/uL (ref 4.0–10.5)

## 2016-07-15 LAB — LIPASE, BLOOD: Lipase: 15 U/L (ref 11–51)

## 2016-07-15 LAB — D-DIMER, QUANTITATIVE (NOT AT ARMC): D DIMER QUANT: 0.32 ug{FEU}/mL (ref 0.00–0.50)

## 2016-07-15 LAB — BRAIN NATRIURETIC PEPTIDE: B NATRIURETIC PEPTIDE 5: 6.6 pg/mL (ref 0.0–100.0)

## 2016-07-15 MED ORDER — PREDNISONE 20 MG PO TABS: 40.0000 mg | ORAL_TABLET | Freq: Every day | ORAL | 0 refills | Status: AC

## 2016-07-15 MED ORDER — IPRATROPIUM-ALBUTEROL 0.5-2.5 (3) MG/3ML IN SOLN
3.0000 mL | Freq: Once | RESPIRATORY_TRACT | Status: AC
  Administered 2016-07-15: 3 mL via RESPIRATORY_TRACT
  Filled 2016-07-15: qty 3

## 2016-07-15 MED ORDER — LEVOFLOXACIN 750 MG PO TABS
750.0000 mg | ORAL_TABLET | Freq: Once | ORAL | Status: AC
  Administered 2016-07-15: 750 mg via ORAL
  Filled 2016-07-15: qty 1

## 2016-07-15 MED ORDER — AEROCHAMBER PLUS FLO-VU MEDIUM MISC
1.0000 | Freq: Once | Status: AC
  Administered 2016-07-15: 1
  Filled 2016-07-15: qty 1

## 2016-07-15 MED ORDER — ALBUTEROL SULFATE HFA 108 (90 BASE) MCG/ACT IN AERS
2.0000 | INHALATION_SPRAY | Freq: Once | RESPIRATORY_TRACT | Status: AC
  Administered 2016-07-15: 2 via RESPIRATORY_TRACT
  Filled 2016-07-15: qty 6.7

## 2016-07-15 MED ORDER — IPRATROPIUM BROMIDE 0.02 % IN SOLN
1.0000 mg | Freq: Once | RESPIRATORY_TRACT | Status: AC
  Administered 2016-07-15: 1 mg via RESPIRATORY_TRACT
  Filled 2016-07-15: qty 5

## 2016-07-15 MED ORDER — BENZONATATE 100 MG PO CAPS: 100.0000 mg | ORAL_CAPSULE | Freq: Three times a day (TID) | ORAL | 0 refills | Status: DC | PRN

## 2016-07-15 MED ORDER — ALBUTEROL (5 MG/ML) CONTINUOUS INHALATION SOLN
10.0000 mg/h | INHALATION_SOLUTION | Freq: Once | RESPIRATORY_TRACT | Status: AC
  Administered 2016-07-15: 10 mg/h via RESPIRATORY_TRACT
  Filled 2016-07-15: qty 20

## 2016-07-15 MED ORDER — PREDNISONE 20 MG PO TABS
60.0000 mg | ORAL_TABLET | Freq: Once | ORAL | Status: AC
  Administered 2016-07-15: 60 mg via ORAL
  Filled 2016-07-15: qty 3

## 2016-07-15 MED ORDER — HYDROCODONE-HOMATROPINE 5-1.5 MG/5ML PO SYRP: 5.0000 mL | ORAL_SOLUTION | ORAL | 0 refills | Status: DC | PRN

## 2016-07-15 MED ORDER — LEVOFLOXACIN 750 MG PO TABS: 750.0000 mg | ORAL_TABLET | Freq: Every day | ORAL | 0 refills | Status: AC

## 2016-07-15 MED ORDER — MORPHINE SULFATE (PF) 4 MG/ML IV SOLN
4.0000 mg | Freq: Once | INTRAVENOUS | Status: AC
  Administered 2016-07-15: 4 mg via INTRAVENOUS
  Filled 2016-07-15: qty 1

## 2016-07-15 MED ORDER — SODIUM CHLORIDE 0.9 % IV BOLUS (SEPSIS)
1000.0000 mL | Freq: Once | INTRAVENOUS | Status: AC
  Administered 2016-07-15: 1000 mL via INTRAVENOUS

## 2016-07-15 NOTE — ED Notes (Signed)
Pt stated that she does have a cough, for about 2 weeks

## 2016-07-15 NOTE — ED Provider Notes (Signed)
[facility] DEPT Provider Note   CSN: [id number] Arrival date & time: 07/15/16  1048     History   Chief Complaint Chief Complaint  Patient presents with  . Chest Pain  . Shortness of Breath    HPI Monica Estrada is a 51 y.o. female.  HPI   51 year old female with past medical history as below who presents with chest pain and shortness of breath. The patient states that for the past 2 weeks, her symptoms started as mild nasal congestion and cough. Since then, she has had productive cough as well as wheezing. She has tried over-the-counter cough medicines without any improvement. Over the last several days, she has felt like her wheezing has gotten worse and she developed an aching, generalized chest pain that is worse with movement as well as after coughing. She subsequent presents for evaluation. She does feel generally more short of breath than usual. The chest pain does not happen with exertion but is related to her coughing. No nausea, vomiting, or diarrhea. No fever or chills.  Past Medical History:  Diagnosis Date  . Abnormal Pap smear   . Colonic edema    mild left- ed eval abd ct  . Colonic edema   . Gave birth to child recently    2012  . History of hypothyroidism 1999   of post partum  . Hx of abnormal cervical Pap smear   . Hypertension   . Uveitis    stable    Patient Active Problem List   Diagnosis Date Noted  . Wheezing 04/01/2015  . Essential hypertension 04/17/2014  . TSH elevation 04/17/2014  . Medication management 04/17/2014  . Cough 01/06/2013  . History of hypokalemia 11/26/2012  . Tingling 11/26/2012  . BMI 32.0-32.9,adult 01/08/2012  . Umbilical hernia  possible from lap site  12/05/2011  . Preventative health care 11/12/2010  . Uveitis   . History of hypothyroidism   . NONSPECIFIC ABN FINDING RAD & OTH EXAM GI TRACT 11/25/2009  . Anemia 11/03/2009  . AUTOIMMUNE THYROIDITIS 11/20/2008  . UVEITIS 12/12/2006  . HYPERLIPIDEMIA  12/11/2006    Past Surgical History:  Procedure Laterality Date  . CHOLECYSTECTOMY     1999  . TUBAL LIGATION  12/31/2010   Procedure: BILATERAL TUBAL LIGATION;  Surgeon: Blane Ohara Meisinger;  Location: [facility];  Service: Gynecology;  Laterality: Bilateral;  Bilateral post partum tubal ligation    OB History    Gravida Para Term Preterm AB Living   2 2 2  0 0 2   SAB TAB Ectopic Multiple Live Births   0 0 0 0 1       Home Medications    Prior to Admission medications   Medication Sig Start Date End Date Taking? Authorizing Provider  amLODipine (NORVASC) 5 MG tablet TAKE 1 TABLET (5 MG TOTAL) BY MOUTH DAILY. 11/30/15  Yes Burnis Medin, MD  atropine 1 % ophthalmic solution Place 1 drop into the right eye 2 (two) times daily.   Yes Historical Provider, MD  ferrous sulfate 325 (65 FE) MG tablet Take 650 mg by mouth daily with breakfast.   Yes Historical Provider, MD  lisinopril-hydrochlorothiazide (PRINZIDE,ZESTORETIC) 20-12.5 MG tablet TAKE 1 TABLET BY MOUTH DAILY. 11/30/15  Yes Burnis Medin, MD  prednisoLONE acetate (PRED FORTE) 1 % ophthalmic suspension Place 1 drop into the right eye 4 (four) times daily.   Yes Historical Provider, MD  albuterol (PROVENTIL HFA;VENTOLIN HFA) 108 (90 Base) MCG/ACT inhaler Inhale 2 puffs into  the lungs every 6 (six) hours as needed for wheezing or shortness of breath. 03/13/16   Burnis Medin, MD  benzonatate (TESSALON PERLES) 100 MG capsule Take 1 capsule (100 mg total) by mouth 3 (three) times daily as needed for cough. 07/15/16   Duffy Bruce, MD  HYDROcodone-homatropine [facility]) 5-1.5 MG/5ML syrup Take 5 mLs by mouth every 4 (four) hours as needed for cough. 07/15/16   Duffy Bruce, MD  ibuprofen (ADVIL,MOTRIN) 600 MG tablet Take 1 tablet (600 mg total) by mouth every 6 (six) hours as needed for moderate pain. Patient not taking: Reported on 07/15/2016 07/02/15   Janora Norlander, DO  levofloxacin (LEVAQUIN) 750 MG tablet Take 1 tablet (750 mg total)  by mouth daily. 07/15/16 07/19/16  Duffy Bruce, MD  predniSONE (DELTASONE) 20 MG tablet Take 2 tablets (40 mg total) by mouth daily. 07/15/16 07/20/16  Duffy Bruce, MD    Family History Family History  Problem Relation Age of Onset  . Hypertension Mother   . Diabetes Mother   . Thyroid disease Mother   . Diabetes Father   . Hypertension Father   . Lupus Sister   . Rheum arthritis Sister   . Other Sister     lamb disease  . Diabetes Brother   . Diabetes Son   . Stroke Sister     Social History Social History  Substance Use Topics  . Smoking status: Never Smoker  . Smokeless tobacco: Never Used  . Alcohol use No     Allergies   Patient has no known allergies.   Review of Systems Review of Systems  Constitutional: Positive for chills and fatigue. Negative for fever.  HENT: Positive for congestion and sore throat.   Respiratory: Positive for cough, shortness of breath and wheezing.   Cardiovascular: Positive for chest pain.  Neurological: Positive for weakness.  All other systems reviewed and are negative.    Physical Exam Updated Vital Signs BP 122/59   Pulse (!) 123   Temp 98.7 F (37.1 C)   Resp 20   Ht 5\' 4"  (1.626 m)   Wt 204 lb (92.5 kg)   LMP 06/24/2016   SpO2 95%   BMI 35.02 kg/m   Physical Exam  Constitutional: She is oriented to person, place, and time. She appears well-developed and well-nourished. No distress.  HENT:  Head: Normocephalic and atraumatic.  Mild posterior pharyngeal erythema. No tonsillar swelling or exudates. Oropharynx is widely patent.  Eyes: Conjunctivae are normal.  Neck: Neck supple.  No meningismus. Painless range of motion.  Cardiovascular: Normal rate, regular rhythm and normal heart sounds.  Exam reveals no friction rub.   No murmur heard. Pulmonary/Chest: She is in respiratory distress. She has wheezes. She has no rales.  Mild tachypnea with diffuse expiratory wheezing. There is overall good aeration. Speaking in  full sentences.  Abdominal: Soft. She exhibits no distension. There is no tenderness.  Musculoskeletal: She exhibits no edema.  Neurological: She is alert and oriented to person, place, and time. She exhibits normal muscle tone.  Skin: Skin is warm. Capillary refill takes less than 2 seconds.  Psychiatric: She has a normal mood and affect.  Nursing note and vitals reviewed.    ED Treatments / Results  Labs (all labs ordered are listed, but only abnormal results are displayed) Labs Reviewed  BASIC METABOLIC PANEL - Abnormal; Notable for the following:       Result Value   Glucose, Bld 135 (*)    All other  components within normal limits  CBC - Abnormal; Notable for the following:    MCV 77.7 (*)    All other components within normal limits  HEPATIC FUNCTION PANEL - Abnormal; Notable for the following:    Bilirubin, Direct <0.1 (*)    All other components within normal limits  LIPASE, BLOOD  BRAIN NATRIURETIC PEPTIDE  D-DIMER, QUANTITATIVE (NOT AT [facility])  Randolm Idol, ED  I-STAT TROPOININ, ED    EKG  EKG Interpretation  Date/Time:  Saturday July 15 2016 11:06:46 EST Ventricular Rate:  93 PR Interval:  174 QRS Duration: 78 QT Interval:  370 QTC Calculation: 460 R Axis:   -9 Text Interpretation:  Normal sinus rhythm Low voltage QRS Borderline ECG Since last EKG Although rate has decreased Otherwise no significant change Confirmed by Ellender Hose MD, Lysbeth Galas [id number]) on 07/15/2016 11:59:28 AM       Radiology Dg Chest 2 View  Result Date: 07/15/2016 CLINICAL DATA:  Central chest pain with shortness of breath. EXAM: CHEST  2 VIEW COMPARISON:  07/02/2015 FINDINGS: Scoliosis in the thoracic spine. The lungs are clear. Heart and mediastinum are within normal limits. No pleural effusions. Surgical clips in the right upper abdomen. IMPRESSION: No active cardiopulmonary disease. Thoracic spine scoliosis. Electronically Signed   By: Markus Daft M.D.   On: 07/15/2016 11:54     Procedures Procedures (including critical care time)  Medications Ordered in ED Medications  ipratropium-albuterol (DUONEB) 0.5-2.5 (3) MG/3ML nebulizer solution 3 mL (3 mLs Nebulization Given 07/15/16 1303)  predniSONE (DELTASONE) tablet 60 mg (60 mg Oral Given 07/15/16 1302)  sodium chloride 0.9 % bolus 1,000 mL (0 mLs Intravenous Stopped 07/15/16 1517)  levofloxacin (LEVAQUIN) tablet 750 mg (750 mg Oral Given 07/15/16 1332)  morphine 4 MG/ML injection 4 mg (4 mg Intravenous Given 07/15/16 1347)  ipratropium (ATROVENT) nebulizer solution 1 mg (1 mg Nebulization Given 07/15/16 1332)  albuterol (PROVENTIL,VENTOLIN) solution continuous neb (10 mg/hr Nebulization Given 07/15/16 1401)  albuterol (PROVENTIL HFA;VENTOLIN HFA) 108 (90 Base) MCG/ACT inhaler 2 puff (2 puffs Inhalation Given 07/15/16 1558)  AEROCHAMBER PLUS FLO-VU MEDIUM MISC 1 each (1 each Other Given 07/15/16 1602)     Initial Impression / Assessment and Plan / ED Course  I have reviewed the triage vital signs and the nursing notes.  Pertinent labs & imaging results that were available during my care of the patient were reviewed by me and considered in my medical decision making (see chart for details).     51 year old female with history of recurrent bronchitis here with several days of cough, wheezing, and shortness of breath. On arrival, patient does have diffuse wheezing consistent with likely bronchitis versus wheezing associated respiratory illness. Early pneumonia is also on the differential. Chest x-ray obtained and shows no focal infiltrate. Patient given multiple breathing treatments here in the ED with excellent improvement and resolution of wheezing. She is in ambulatory without difficulty. She has mild tachycardia, but this began after albuterol only and she is otherwise very well-appearing. Her lab work shows negative troponin 2, EKG is nonischemic, editor not suspect cardiac etiology. Similarly, she has a negative d-dimer and I  do not suspect PE or dissection. Will discharge with steroids, antitussives, ABX and close PCP follow-up.  Final Clinical Impressions(s) / ED Diagnoses   Final diagnoses:  Wheezing-associated respiratory infection (WARI)  Bronchitis    New Prescriptions Discharge Medication List as of 07/15/2016  3:40 PM    START taking these medications   Details  benzonatate (TESSALON PERLES) 100  MG capsule Take 1 capsule (100 mg total) by mouth 3 (three) times daily as needed for cough., Starting Sat 07/15/2016, Print    HYDROcodone-homatropine (HYCODAN) 5-1.5 MG/5ML syrup Take 5 mLs by mouth every 4 (four) hours as needed for cough., Starting Sat 07/15/2016, Print    levofloxacin (LEVAQUIN) 750 MG tablet Take 1 tablet (750 mg total) by mouth daily., Starting Sat 07/15/2016, Until Wed 07/19/2016, Print    predniSONE (DELTASONE) 20 MG tablet Take 2 tablets (40 mg total) by mouth daily., Starting Sat 07/15/2016, Until Thu 07/20/2016, Print         Duffy Bruce, MD 07/15/16 [id number]

## 2016-07-15 NOTE — ED Triage Notes (Addendum)
Pt c/o center chest pain with shortness of breath and nausea x 3 days. Pt reports symptoms increase at night.

## 2016-07-15 NOTE — ED Notes (Signed)
RT called to give albuterol continuous neb.

## 2016-07-15 NOTE — ED Notes (Signed)
Pt ambulated in hall by RN. Pt denies increased cp or sob. Pt sats remained between 92 and 97% on RA.

## 2016-11-22 ENCOUNTER — Other Ambulatory Visit: Payer: Self-pay | Admitting: Internal Medicine

## 2016-11-22 ENCOUNTER — Other Ambulatory Visit: Payer: Self-pay | Admitting: Emergency Medicine

## 2016-11-22 DIAGNOSIS — E785 Hyperlipidemia, unspecified: Secondary | ICD-10-CM

## 2016-11-22 DIAGNOSIS — Z8639 Personal history of other endocrine, nutritional and metabolic disease: Secondary | ICD-10-CM

## 2016-11-22 DIAGNOSIS — D649 Anemia, unspecified: Secondary | ICD-10-CM

## 2016-11-22 DIAGNOSIS — R7989 Other specified abnormal findings of blood chemistry: Secondary | ICD-10-CM

## 2016-11-22 DIAGNOSIS — I10 Essential (primary) hypertension: Secondary | ICD-10-CM

## 2016-11-22 NOTE — Telephone Encounter (Signed)
Patient is over due to come have labs drawn. Sending in a 30 day supply for blood pressure medication. Please help patient schedule an appointment. Labs are ordered

## 2016-11-23 NOTE — Telephone Encounter (Signed)
Pt has been sch

## 2016-11-28 ENCOUNTER — Other Ambulatory Visit (INDEPENDENT_AMBULATORY_CARE_PROVIDER_SITE_OTHER): Payer: 59

## 2016-11-28 DIAGNOSIS — R946 Abnormal results of thyroid function studies: Secondary | ICD-10-CM | POA: Diagnosis not present

## 2016-11-28 DIAGNOSIS — D649 Anemia, unspecified: Secondary | ICD-10-CM | POA: Diagnosis not present

## 2016-11-28 DIAGNOSIS — I10 Essential (primary) hypertension: Secondary | ICD-10-CM

## 2016-11-28 DIAGNOSIS — R7989 Other specified abnormal findings of blood chemistry: Secondary | ICD-10-CM

## 2016-11-28 LAB — CBC WITH DIFFERENTIAL/PLATELET
BASOS PCT: 1 % (ref 0.0–3.0)
Basophils Absolute: 0 10*3/uL (ref 0.0–0.1)
EOS ABS: 0.2 10*3/uL (ref 0.0–0.7)
EOS PCT: 4.7 % (ref 0.0–5.0)
HCT: 37.6 % (ref 36.0–46.0)
HEMOGLOBIN: 12.7 g/dL (ref 12.0–15.0)
LYMPHS ABS: 1.4 10*3/uL (ref 0.7–4.0)
Lymphocytes Relative: 37.6 % (ref 12.0–46.0)
MCHC: 33.6 g/dL (ref 30.0–36.0)
MCV: 80.3 fl (ref 78.0–100.0)
MONO ABS: 0.3 10*3/uL (ref 0.1–1.0)
Monocytes Relative: 7.9 % (ref 3.0–12.0)
NEUTROS ABS: 1.8 10*3/uL (ref 1.4–7.7)
NEUTROS PCT: 48.8 % (ref 43.0–77.0)
PLATELETS: 166 10*3/uL (ref 150.0–400.0)
RBC: 4.69 Mil/uL (ref 3.87–5.11)
RDW: 12.9 % (ref 11.5–15.5)
WBC: 3.6 10*3/uL — ABNORMAL LOW (ref 4.0–10.5)

## 2016-11-28 LAB — IBC PANEL
IRON: 43 ug/dL (ref 42–145)
Saturation Ratios: 13.9 % — ABNORMAL LOW (ref 20.0–50.0)
TRANSFERRIN: 221 mg/dL (ref 212.0–360.0)

## 2016-11-28 LAB — T4, FREE: Free T4: 0.63 ng/dL (ref 0.60–1.60)

## 2016-11-28 LAB — TSH: TSH: 5.65 u[IU]/mL — AB (ref 0.35–4.50)

## 2016-11-28 LAB — FERRITIN: Ferritin: 55.9 ng/mL (ref 10.0–291.0)

## 2016-11-30 LAB — THYROID ANTIBODIES
THYROID PEROXIDASE ANTIBODY: 266 [IU]/mL — AB (ref <9)
Thyroglobulin Ab: <1 IU/mL (ref <2)

## 2016-12-01 NOTE — Progress Notes (Signed)
Chief Complaint  Patient presents with  . Follow-up    HPI: Monica Estrada 51 y.o. come in for   Fu thyroid  And labs here with child today    Still having periods heavy at times  But 4 days  Had nl gyne check .takes iron. No  Other bleeding  bp :seems ok but may be up from stress of recent time off travel with kids   But ok taking med   No edema  cv sx .   Family hx thyroid disease  No excessive weight change edema  Hair change   ROS: See pertinent positives and negatives per HPI. No cp sob   Past Medical History:  Diagnosis Date  . Abnormal Pap smear   . Colonic edema    mild left- ed eval abd ct  . Colonic edema   . Gave birth to child recently    2012  . History of hypothyroidism 1999   of post partum  . Hx of abnormal cervical Pap smear   . Hypertension   . Uveitis    stable    Family History  Problem Relation Age of Onset  . Hypertension Mother   . Diabetes Mother   . Thyroid disease Mother   . Diabetes Father   . Hypertension Father   . Lupus Sister   . Rheum arthritis Sister   . Other Sister        lamb disease  . Diabetes Brother   . Diabetes Son   . Stroke Sister     Social History   Social History  . Marital status: Single    Spouse name: Monica Estrada  . Number of children: Monica Estrada  . Years of education: Monica Estrada   Social History Main Topics  . Smoking status: Never Smoker  . Smokeless tobacco: Never Used  . Alcohol use No  . Drug use: No  . Sexual activity: Not Currently   Other Topics Concern  . None   Social History Narrative   Monica Estrada  ABOUT 39 HOURS    Single   HH of 3 son  86 y  And baby girl 2 year  Father out of country comes back ocass. Some financial support .    Father  In [place].   No pets.    Neg ets .    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 albuterol (PROVENTIL HFA;VENTOLIN HFA) 108 (90 Base) MCG/ACT inhaler Inhale 2 puffs into the  lungs every 6 (six) hours as needed for wheezing or shortness of breath. 1 Inhaler 1  . amLODipine (NORVASC) 5 MG tablet TAKE 1 TABLET (5 MG TOTAL) BY MOUTH DAILY. 90 tablet 3  . atropine 1 % ophthalmic solution Place 1 drop into the right eye 2 (two) times daily.    . ferrous sulfate 325 (65 FE) MG tablet Take 650 mg by mouth daily with breakfast.    . lisinopril-hydrochlorothiazide (PRINZIDE,ZESTORETIC) 20-12.5 MG tablet TAKE 1 TABLET BY MOUTH DAILY. 30 tablet 0  . prednisoLONE acetate (PRED FORTE) 1 % ophthalmic suspension Place 1 drop into the right eye 4 (four) times daily.    . benzonatate (TESSALON PERLES) 100 MG capsule Take 1 capsule (100 mg total) by mouth 3 (three) times daily as needed for cough. 20 capsule 0  . HYDROcodone-homatropine (HYCODAN)  5-1.5 MG/5ML syrup Take 5 mLs by mouth every 4 (four) hours as needed for cough. 120 mL 0  . ibuprofen (ADVIL,MOTRIN) 600 MG tablet Take 1 tablet (600 mg total) by mouth every 6 (six) hours as needed for moderate pain. (Patient not taking: Reported on 07/15/2016) 30 tablet 0   No facility-administered medications prior to visit.      EXAM:  BP 138/88   Pulse 77   Temp 97.7 F (36.5 C) (Oral)   Wt 207 lb 14.4 oz (94.3 kg)   BMI 35.69 kg/m   Body mass index is 35.69 kg/m.  GENERAL: vitals reviewed and listed above, alert, oriented, appears well hydrated and in no acute distress HEENT: atraumatic, conjunctiva  clear, no obvious abnormalities on inspection of external nose and ears NECK: no obvious masses on inspection palpation  CV: HRRR, no clubbing cyanosis or  peripheral edema nl cap refill  MS: moves all extremities without noticeable focal  abnormality PSYCH: pleasant and cooperative, no obvious depression or anxiety Repeat bp is  Improved  BP Readings from Last 3 Encounters:  12/04/16 138/88  07/15/16 122/59  03/13/16 134/86   Lab Results  Component Value Date   IRON 43 11/28/2016   FERRITIN 55.9 11/28/2016   Sat   13.9 ASSESSMENT AND PLAN:  Discussed the following assessment and plan:  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   Essential hypertension dsic  B[p goals    Option to treat with supplementation for her autoimmune thyroiditis at this time it is safe and she wishes to remain monitoring at this time. I agree. However I did discuss cup blood pressure goals with her to decrease future cardiovascular risk. She is aware repeat blood pressure is improved. Plan her checkup full labs cpx  in December. Or as needed  -Patient advised to return or notify health care team  if  new concerns arise.  Patient Instructions  You have   Autoimmune thyroid problem but seemingly ok function at this time.    And we can follow this. Level   bp better on repeat 138/88 90   Goal is 120/80 or close to this   Plan repeat  Thyroid  And  BP and other labs    At December yearly         Delawrence Fridman K. Kimberlyann Hollar M.D.

## 2016-12-04 ENCOUNTER — Ambulatory Visit (INDEPENDENT_AMBULATORY_CARE_PROVIDER_SITE_OTHER): Payer: 59 | Admitting: Internal Medicine

## 2016-12-04 ENCOUNTER — Encounter: Payer: Self-pay | Admitting: Internal Medicine

## 2016-12-04 VITALS — BP 138/88 | HR 77 | Temp 97.7°F | Wt 207.9 lb

## 2016-12-04 DIAGNOSIS — Z79899 Other long term (current) drug therapy: Secondary | ICD-10-CM | POA: Diagnosis not present

## 2016-12-04 DIAGNOSIS — R7989 Other specified abnormal findings of blood chemistry: Secondary | ICD-10-CM

## 2016-12-04 DIAGNOSIS — E063 Autoimmune thyroiditis: Secondary | ICD-10-CM

## 2016-12-04 DIAGNOSIS — I10 Essential (primary) hypertension: Secondary | ICD-10-CM

## 2016-12-04 DIAGNOSIS — E611 Iron deficiency: Secondary | ICD-10-CM

## 2016-12-04 DIAGNOSIS — R946 Abnormal results of thyroid function studies: Secondary | ICD-10-CM | POA: Diagnosis not present

## 2016-12-04 NOTE — Patient Instructions (Addendum)
You have   Autoimmune thyroid problem but seemingly ok function at this time.    And we can follow this. Level   bp better on repeat 138/88 90   Goal is 120/80 or close to this   Plan repeat  Thyroid  And  BP and other labs    At December yearly

## 2016-12-30 ENCOUNTER — Other Ambulatory Visit: Payer: Self-pay | Admitting: Internal Medicine

## 2017-02-02 ENCOUNTER — Encounter: Payer: Self-pay | Admitting: Internal Medicine

## 2017-03-26 ENCOUNTER — Other Ambulatory Visit: Payer: Self-pay | Admitting: Internal Medicine

## 2017-03-27 NOTE — Telephone Encounter (Signed)
Medication filled to pharmacy as requested.   

## 2017-04-23 ENCOUNTER — Encounter: Payer: 59 | Admitting: Internal Medicine

## 2017-04-30 ENCOUNTER — Other Ambulatory Visit: Payer: Self-pay | Admitting: Internal Medicine

## 2017-04-30 MED ORDER — LISINOPRIL-HYDROCHLOROTHIAZIDE 20-12.5 MG PO TABS: 1.0000 | ORAL_TABLET | Freq: Every day | ORAL | 0 refills | Status: DC

## 2017-05-04 LAB — HM PAP SMEAR: HM Pap smear: NEGATIVE

## 2017-05-17 ENCOUNTER — Ambulatory Visit: Payer: Self-pay | Admitting: *Deleted

## 2017-05-17 ENCOUNTER — Ambulatory Visit (INDEPENDENT_AMBULATORY_CARE_PROVIDER_SITE_OTHER): Payer: BLUE CROSS/BLUE SHIELD | Admitting: Family Medicine

## 2017-05-17 ENCOUNTER — Encounter: Payer: Self-pay | Admitting: Family Medicine

## 2017-05-17 VITALS — BP 150/90 | HR 117 | Temp 98.9°F | Ht 64.0 in | Wt 211.2 lb

## 2017-05-17 DIAGNOSIS — R05 Cough: Secondary | ICD-10-CM | POA: Diagnosis not present

## 2017-05-17 DIAGNOSIS — R062 Wheezing: Secondary | ICD-10-CM | POA: Diagnosis not present

## 2017-05-17 DIAGNOSIS — R0602 Shortness of breath: Secondary | ICD-10-CM

## 2017-05-17 DIAGNOSIS — J209 Acute bronchitis, unspecified: Secondary | ICD-10-CM | POA: Diagnosis not present

## 2017-05-17 DIAGNOSIS — R0902 Hypoxemia: Secondary | ICD-10-CM | POA: Diagnosis not present

## 2017-05-17 MED ORDER — IPRATROPIUM-ALBUTEROL 0.5-2.5 (3) MG/3ML IN SOLN
3.0000 mL | Freq: Once | RESPIRATORY_TRACT | Status: AC
  Administered 2017-05-17: 3 mL via RESPIRATORY_TRACT

## 2017-05-17 MED ORDER — ALBUTEROL SULFATE HFA 108 (90 BASE) MCG/ACT IN AERS: 2.0000 | INHALATION_SPRAY | Freq: Four times a day (QID) | RESPIRATORY_TRACT | 1 refills | Status: DC | PRN

## 2017-05-17 MED ORDER — GUAIFENESIN-CODEINE 100-10 MG/5ML PO SOLN: 5.0000 mL | Freq: Four times a day (QID) | ORAL | 0 refills | Status: DC | PRN

## 2017-05-17 MED ORDER — AZITHROMYCIN 250 MG PO TABS: ORAL_TABLET | ORAL | 0 refills | Status: DC

## 2017-05-17 MED ORDER — TRIAMCINOLONE ACETONIDE 40 MG/ML IJ SUSP
80.0000 mg | Freq: Once | INTRAMUSCULAR | Status: AC
  Administered 2017-05-17: 80 mg via INTRAMUSCULAR

## 2017-05-17 MED ORDER — ALBUTEROL SULFATE (2.5 MG/3ML) 0.083% IN NEBU
2.5000 mg | INHALATION_SOLUTION | Freq: Once | RESPIRATORY_TRACT | Status: AC
  Administered 2017-05-17: 2.5 mg via RESPIRATORY_TRACT

## 2017-05-17 NOTE — Progress Notes (Signed)
Subjective  CC:  Chief Complaint  Patient presents with  . Cough     x 5 days,   . Nasal Congestion    HPI: Monica Estrada is a 52 y.o. female who presents to the office today to address the problems listed above in the chief complaint.  52 yo pt of Dr. Regis Bill here with significant cough and sob; reports x 5 days with tightness in chest and coughing spasms with any activity. Denies fever but has had some chills. No ST or myaglias. No pleuritic chest pain. No left sided cp or palpiations. No n/v/d or abdominal pain. Had one prior episode of wheezing requiring an inhaler but does not endorse h/o asthma or copd. She is a non smoker. No sinus pain.    I reviewed the patients updated PMH, FH, and SocHx.    Patient Active Problem List   Diagnosis Date Noted  . Wheezing 04/01/2015  . Essential hypertension 04/17/2014  . TSH elevation 04/17/2014  . Medication management 04/17/2014  . Cough 01/06/2013  . History of hypokalemia 11/26/2012  . Tingling 11/26/2012  . BMI 32.0-32.9,adult 01/08/2012  . Umbilical hernia  possible from lap site  12/05/2011  . Preventative health care 11/12/2010  . Uveitis   . History of hypothyroidism   . NONSPECIFIC ABN FINDING RAD & OTH EXAM GI TRACT 11/25/2009  . Anemia 11/03/2009  . AUTOIMMUNE THYROIDITIS 11/20/2008  . UVEITIS 12/12/2006  . Hyperlipemia 12/11/2006   Current Meds  Medication Sig  . albuterol (PROVENTIL HFA;VENTOLIN HFA) 108 (90 Base) MCG/ACT inhaler Inhale 2 puffs into the lungs every 6 (six) hours as needed for wheezing or shortness of breath.  Marland Kitchen amLODipine (NORVASC) 5 MG tablet TAKE 1 TABLET (5 MG TOTAL) BY MOUTH DAILY.  Marland Kitchen atropine 1 % ophthalmic solution Place 1 drop into the right eye 2 (two) times daily.  . ferrous sulfate 325 (65 FE) MG tablet Take 650 mg by mouth daily with breakfast.  . lisinopril-hydrochlorothiazide (PRINZIDE,ZESTORETIC) 20-12.5 MG tablet Take 1 tablet by mouth daily.  . [DISCONTINUED] albuterol (PROVENTIL  HFA;VENTOLIN HFA) 108 (90 Base) MCG/ACT inhaler Inhale 2 puffs into the lungs every 6 (six) hours as needed for wheezing or shortness of breath.    Allergies: Patient has No Known Allergies. Family History: Patient family history includes Diabetes in her brother, father, mother, and son; Hypertension in her father and mother; Lupus in her sister; Other in her sister; Rheum arthritis in her sister; Stroke in her sister; Thyroid disease in her mother. Social History:  Patient  reports that  has never smoked. she has never used smokeless tobacco. She reports that she does not drink alcohol or use drugs.  Review of Systems: Constitutional: Negative for fever malaise or anorexia Cardiovascular: negative for chest pain Respiratory: Positive for SOB or persistent cough Gastrointestinal: negative for abdominal pain  Objective  Vitals: BP (!) 150/90   Pulse (!) 117   Temp 98.9 F (37.2 C) (Oral)   Ht 5\' 4"  (1.626 m)   Wt 211 lb 4 oz (95.8 kg)   LMP 05/11/2017   SpO2 93%   BMI 36.26 kg/m  General: pt can speak carefully in full sentences but with deep breaths or moving to table, has coughing spasms with SOB, no retractions HEENT: PEERL, conjunctiva normal, Oropharynx moist with minimal erythema,neck is supple, + LAD Cardiovascular:  RRR without murmur or gallop. Tachy after 2 neb treatments, no LE edema Respiratory:  Initial breath sounds are shallow and tight w/o rales;  after alb, then duoneb, increased air mvt with diffuse expiratory wheeze Skin:  Warm, no rashes, no cyanosis  Pulse ox with walking ranged form 95-93%; bp and HR increased after breathing treatments.  Pt improved after 2 nebulizer treatments; breathing still with significant wheeze. No rales audible. No retractions or tachypnea  Assessment  1. Bronchitis with bronchospasm   2. Hypoxia      Plan   Discussed mild respiratory distress:  Offered further evaluation in ER with labs and xray but patient declined. Will  treat with abx, albuterol MDI, and Kenalog injection and cough meds; counseled on need for immediate evaluation for any worsening of sxs. Pt to get cxr tomorrow for my review. Upon discharge, she was talking in complete sentences and was stable. I recommend close f/u at PCPs office early next week. Pt to call tomorrow with any questions. No signs of CHF or CAD or PE.   Follow up: f/u with PCP in 2-5 days. To er for any change in sxs.    Commons side effects, risks, benefits, and alternatives for medications and treatment plan prescribed today were discussed, and the patient expressed understanding of the given instructions. Patient is instructed to call or message via MyChart if he/she has any questions or concerns regarding our treatment plan. No barriers to understanding were identified. We discussed Red Flag symptoms and signs in detail. Patient expressed understanding regarding what to do in case of urgent or emergency type symptoms.   Medication list was reconciled, printed and provided to the patient in AVS. Patient instructions and summary information was reviewed with the patient as documented in the AVS. This note was prepared with assistance of Dragon voice recognition software. Occasional wrong-word or sound-a-like substitutions may have occurred due to the inherent limitations of voice recognition software  Orders Placed This Encounter  Procedures  . DG Chest 2 View   Meds ordered this encounter  Medications  . albuterol (PROVENTIL) (2.5 MG/3ML) 0.083% nebulizer solution 2.5 mg  . ipratropium-albuterol (DUONEB) 0.5-2.5 (3) MG/3ML nebulizer solution 3 mL  . azithromycin (ZITHROMAX) 250 MG tablet    Sig: Take 2 tabs today, then 1 tab daily for 4 days    Dispense:  1 each    Refill:  0  . guaiFENesin-codeine 100-10 MG/5ML syrup    Sig: Take 5 mLs by mouth every 6 (six) hours as needed for cough.    Dispense:  120 mL    Refill:  0  . albuterol (PROVENTIL HFA;VENTOLIN HFA) 108 (90  Base) MCG/ACT inhaler    Sig: Inhale 2 puffs into the lungs every 6 (six) hours as needed for wheezing or shortness of breath.    Dispense:  1 Inhaler    Refill:  1  . triamcinolone acetonide (KENALOG-40) injection 80 mg

## 2017-05-17 NOTE — Patient Instructions (Signed)
Please make a follow up appointment with Dr. Regis Bill or her office early next week.  Please go to the ER if your breathing becomes any worse.   Please go to our [facility] office to get your xrays done. You can walk in M-F between 8am and 5pm. Tell them you are there for xrays ordered by me. They will send me the results, then I will let you know the results with instructions.   Address: [place], [place], [place]  (office sits at [address] at [address]; from here, turn left onto [address]), take to [address], turn right and go for a mile or so, office will be on left across form [employer] )   How to Use a Metered Dose Inhaler A metered dose inhaler is a handheld device for taking medicine that must be breathed into the lungs (inhaled). The device can be used to deliver a variety of inhaled medicines, including:  Quick relief or rescue medicines, such as bronchodilators.  Controller medicines, such as corticosteroids.  The medicine is delivered by pushing down on a metal canister to release a preset amount of spray and medicine. Each device contains the amount of medicine that is needed for a preset number of uses (inhalations). Your health care provider may recommend that you use a spacer with your inhaler to help you take the medicine more effectively. A spacer is a plastic tube with a mouthpiece on one end and an opening that connects to the inhaler on the other end. A spacer holds the medicine in a tube for a short time, which allows you to inhale more medicine. What are the risks? If you do not use your inhaler correctly, medicine might not reach your lungs to help you breathe. Inhaler medicine can cause side effects, such as:  Mouth or throat infection.  Cough.  Hoarseness.  Headache.  Nausea and vomiting.  Lung infection (pneumonia) in people who have a lung condition called  COPD.  How to use a metered dose inhaler without a spacer 1. Remove the cap from the inhaler. 2. If you are using the inhaler for the first time, shake it for 5 seconds, turn it away from your face, then release 4 puffs into the air. This is called priming. 3. Shake the inhaler for 5 seconds. 4. Position the inhaler so the top of the canister faces up. 5. Put your index finger on the top of the medicine canister. Support the bottom of the inhaler with your thumb. 6. Breathe out normally and as completely as possible, away from the inhaler. 7. Either place the inhaler between your teeth and close your lips tightly around the mouthpiece, or hold the inhaler 1-2 inches (2.5-5 cm) away from your open mouth. Keep your tongue down out of the way. If you are unsure which technique to use, ask your health care provider. 8. Press the canister down with your index finger to release the medicine, then inhale deeply and slowly through your mouth (not your nose) until your lungs are completely filled. Inhaling should take 4-6 seconds. 9. Hold the medicine in your lungs for 5-10 seconds (10 seconds is best). This helps the medicine get into the small airways of your lungs. 10. With your lips in a tight circle (pursed), breathe out slowly. 11. Repeat steps 3-10 until you have taken the number of puffs that your health care provider directed. Wait about 1 minute between  puffs or as directed. 12. Put the cap on the inhaler. 13. If you are using a steroid inhaler, rinse your mouth with water, gargle, and spit out the water. Do not swallow the water. How to use a metered dose inhaler with a spacer 1. Remove the cap from the inhaler. 2. If you are using the inhaler for the first time, shake it for 5 seconds, turn it away from your face, then release 4 puffs into the air. This is called priming. 3. Shake the inhaler for 5 seconds. 4. Place the open end of the spacer onto the inhaler mouthpiece. 5. Position the  inhaler so the top of the canister faces up and the spacer mouthpiece faces you. 6. Put your index finger on the top of the medicine canister. Support the bottom of the inhaler and the spacer with your thumb. 7. Breathe out normally and as completely as possible, away from the spacer. 8. Place the spacer between your teeth and close your lips tightly around it. Keep your tongue down out of the way. 9. Press the canister down with your index finger to release the medicine, then inhale deeply and slowly through your mouth (not your nose) until your lungs are completely filled. Inhaling should take 4-6 seconds. 10. Hold the medicine in your lungs for 5-10 seconds (10 seconds is best). This helps the medicine get into the small airways of your lungs. 11. With your lips in a tight circle (pursed), breathe out slowly. 12. Repeat steps 3-11 until you have taken the number of puffs that your health care provider directed. Wait about 1 minute between puffs or as directed. 13. Remove the spacer from the inhaler and put the cap on the inhaler. 14. If you are using a steroid inhaler, rinse your mouth with water, gargle, and spit out the water. Do not swallow the water. Follow these instructions at home:  Take your inhaled medicine only as told by your health care provider. Do not use the inhaler more than directed by your health care provider.  Keep all follow-up visits as told by your health care provider. This is important.  If your inhaler has a counter, you can check it to determine how full your inhaler is. If your inhaler does not have a counter, ask your health care provider when you will need to refill your inhaler and write the refill date on a calendar or on your inhaler canister. Note that you cannot know when an inhaler is empty by shaking it.  Follow directions on the package insert for care and cleaning of your inhaler and spacer. Contact a health care provider if:  Symptoms are only partially  relieved with your inhaler.  You are having trouble using your inhaler.  You have an increase in phlegm.  You have headaches. Get help right away if:  You feel little or no relief after using your inhaler.  You have dizziness.  You have a fast heart rate.  You have chills or a fever.  You have night sweats.  There is blood in your phlegm. Summary  A metered dose inhaler is a handheld device for taking medicine that must be breathed into the lungs (inhaled).  The medicine is delivered by pushing down on a metal canister to release a preset amount of spray and medicine.  Each device contains the amount of medicine that is needed for a preset number of uses (inhalations). This information is not intended to replace advice given to you by  your health care provider. Make sure you discuss any questions you have with your health care provider. Document Released: 05/01/2005 Document Revised: 03/21/2016 Document Reviewed: 03/21/2016 Elsevier Interactive Patient Education  2017 [employer].

## 2017-05-17 NOTE — Telephone Encounter (Signed)
Patient is calling with severe cough- she states it is reminiscent of when she had bronchitis. She has tried OTC medication without relief. She is coughing so bad it is hard to talk to her. Reason for Disposition . SEVERE coughing spells (e.g., whooping sound after coughing, vomiting after coughing)  Answer Assessment - Initial Assessment Questions 1. ONSET: "When did the cough begin?"      5-6 days 2. SEVERITY: "How bad is the cough today?"        10-15 minute breaks between cough spells- gets worse with exertion. Patient reports she has urinary incontinence with coughing 3. RESPIRATORY DISTRESS: "Describe your breathing."      Hard to breath during coughing episode- inhaler does not help 4. FEVER: "Do you have a fever?" If so, ask: "What is your temperature, how was it measured, and when did it start?"     Patient had chills for one day 5. SPUTUM: "Describe the color of your sputum" (clear, white, yellow, green)     Mostly dry- occasionally moves something up 6. HEMOPTYSIS: "Are you coughing up any blood?" If so ask: "How much?" (flecks, streaks, tablespoons, etc.)     no 7. CARDIAC HISTORY: "Do you have any history of heart disease?" (e.g., heart attack, congestive heart failure)      no 8. LUNG HISTORY: "Do you have any history of lung disease?"  (e.g., pulmonary embolus, asthma, emphysema)     no 9. PE RISK FACTORS: "Do you have a history of blood clots?" (or: recent major surgery, recent prolonged travel, bedridden )     no 10. OTHER SYMPTOMS: "Do you have any other symptoms?" (e.g., runny nose, wheezing, chest pain)       Patient states she hear rattling or whistling when she breathes 11. PREGNANCY: "Is there any chance you are pregnant?" "When was your last menstrual period?"       LMP-12/28   12. TRAVEL: "Have you traveled out of the country in the last month?" (e.g., travel history, exposures)       no  Protocols used: COUGH - ACUTE NON-PRODUCTIVE-A-AH, COUGH - ACUTE  PRODUCTIVE-A-AH

## 2017-05-18 ENCOUNTER — Ambulatory Visit (INDEPENDENT_AMBULATORY_CARE_PROVIDER_SITE_OTHER): Payer: BLUE CROSS/BLUE SHIELD

## 2017-05-18 DIAGNOSIS — J209 Acute bronchitis, unspecified: Secondary | ICD-10-CM | POA: Diagnosis not present

## 2017-05-18 DIAGNOSIS — R0902 Hypoxemia: Secondary | ICD-10-CM | POA: Diagnosis not present

## 2017-05-20 NOTE — Progress Notes (Signed)
Please call patient: I have reviewed his/her lab results. Her xray is clear. Please ensure that she is breathing better with the medications we started. Thanks.

## 2017-07-24 ENCOUNTER — Telehealth: Payer: Self-pay | Admitting: Internal Medicine

## 2017-07-24 NOTE — Telephone Encounter (Signed)
Copied from [place] [id number]. Topic: Quick Communication - Rx Refill/Question >> Jul 24, 2017  5:04 PM Wynetta Emery, [place] C wrote:    Medication: lisinopril-hydrochlorothiazide (PRINZIDE,ZESTORETIC) 20-12.5 MG tablet and also amLODipine (NORVASC) 5 MG tablet ----- 90 day supply.    Has the patient contacted their pharmacy? No    (Agent: If no, request that the patient contact the pharmacy for the refill.)   Preferred Pharmacy (with phone number or street name): [id number] [address], [place]. [phone] (Phone) [phone] (Fax)     Agent: Please be advised that RX refills may take up to 3 business days. We ask that you follow-up with your pharmacy.

## 2017-07-25 NOTE — Telephone Encounter (Signed)
Pt called back and was advised of need for appt. Pt states CPE in Dec cancelled by provider but per notes it was a no show. Offered pt multiple CPE appts and she has currently declined stating she will check her work schedule and call back.

## 2017-07-25 NOTE — Telephone Encounter (Signed)
TC to patient. Dr. Velora Mediate note from the patient's office visit on 12/04/16 indicated she wanted to see patient in December 2018 for labs and B/P. Patient needs appointment before refills. No answer, Left VM to call for appointment.

## 2017-07-31 DIAGNOSIS — H21541 Posterior synechiae (iris), right eye: Secondary | ICD-10-CM | POA: Diagnosis not present

## 2017-07-31 DIAGNOSIS — H35033 Hypertensive retinopathy, bilateral: Secondary | ICD-10-CM | POA: Diagnosis not present

## 2017-07-31 DIAGNOSIS — H209 Unspecified iridocyclitis: Secondary | ICD-10-CM | POA: Diagnosis not present

## 2017-07-31 DIAGNOSIS — H35351 Cystoid macular degeneration, right eye: Secondary | ICD-10-CM | POA: Diagnosis not present

## 2017-08-24 ENCOUNTER — Other Ambulatory Visit: Payer: Self-pay | Admitting: Internal Medicine

## 2017-09-21 NOTE — Progress Notes (Signed)
Chief Complaint  Patient presents with  . Annual Exam    No new concerns    HPI: Patient  Monica Estrada  52 y.o. comes in today for Preventive Health Care visit   And Chronic disease management  Last seen July 2018 by me   bp :  Out of med   For 3 weeks .    But may not have been at goal then a bit  Sleep:  No osa sx but does wakening  at times     Sees gyne   Does stool tests and ok   For colon screen ? On 10 year recall.   Health Maintenance  Topic Date Due  . INFLUENZA VACCINE  05/17/2018 (Originally 12/13/2017)  . MAMMOGRAM  12/29/2017  . COLONOSCOPY  12/07/2019  . PAP SMEAR  05/04/2020  . TETANUS/TDAP  12/31/2020  . HIV Screening  Completed   Health Maintenance Review LIFESTYLE:  Exercise:   Not a lot .  Tobacco/ETS: no Alcohol:  no Sugar beverages: Sleep:not good lately .     Drug use: no   HH of  2  No pets  Work:  2 job 40 and 2 days  Plus   ROS:  GEN/ HEENT: No fever, significant weight changes sweats headaches vision problems hearing changes, CV/ PULM; No chest pain shortness of breath cough, syncope,edema  change in exercise tolerance. GI /GU: No adominal pain, vomiting, change in bowel habits. No blood in the stool. No significant GU symptoms. SKIN/HEME: ,no acute skin rashes suspicious lesions or bleeding. No lymphadenopathy, nodules, masses.  NEURO/ PSYCH:  No neurologic signs such as weakness numbness. No depression anxiety. IMM/ Allergy: No unusual infections.  Allergy .   REST of 12 system review negative except as per HPI   Past Medical History:  Diagnosis Date  . Abnormal Pap smear   . Colonic edema    mild left- ed eval abd ct  . Colonic edema   . Gave birth to child recently    2012  . History of hypothyroidism 1999   of post partum  . Hx of abnormal cervical Pap smear   . Hypertension   . Uveitis    stable    Past Surgical History:  Procedure Laterality Date  . CHOLECYSTECTOMY     1999  . TUBAL LIGATION  12/31/2010   Procedure: BILATERAL TUBAL LIGATION;  Surgeon: Blane Ohara Meisinger;  Location: [facility];  Service: Gynecology;  Laterality: Bilateral;  Bilateral post partum tubal ligation    Family History  Problem Relation Age of Onset  . Hypertension Mother   . Diabetes Mother   . Thyroid disease Mother   . Diabetes Father   . Hypertension Father   . Lupus Sister   . Rheum arthritis Sister   . Other Sister        lamb disease  . Diabetes Brother   . Diabetes Son   . Stroke Sister     Social History   Socioeconomic History  . Marital status: Single    Spouse name: Not on file  . Number of children: Not on file  . Years of education: Not on file  . Highest education level: Not on file  Occupational History  . Not on file  Social Needs  . Financial resource strain: Not on file  . Food insecurity:    Worry: Not on file    Inability: Not on file  . Transportation needs:    Medical: Not on  file    Non-medical: Not on file  Tobacco Use  . Smoking status: Never Smoker  . Smokeless tobacco: Never Used  Substance and Sexual Activity  . Alcohol use: No  . Drug use: No  . Sexual activity: Not Currently  Lifestyle  . Physical activity:    Days per week: Not on file    Minutes per session: Not on file  . Stress: Not on file  Relationships  . Social connections:    Talks on phone: Not on file    Gets together: Not on file    Attends religious service: Not on file    Active member of club or organization: Not on file    Attends meetings of clubs or organizations: Not on file    Relationship status: Not on file  Other Topics Concern  . Not on file  Social History Narrative   [name]  ABOUT 70 HOURS    Single   HH of 3 son  59 y  And baby girl 2 year  Father out of country comes back ocass. Some financial support .    Father  In [employer].   No pets.    Neg ets .    2 jobs  hh of 2  51 yo and older teen son college                 Allergies as of 09/24/2017   No Known Allergies       Medication List        Accurate as of 09/24/17  5:22 PM. Always use your most recent med list.          albuterol 108 (90 Base) MCG/ACT inhaler Commonly known as:  PROVENTIL HFA;VENTOLIN HFA Inhale 2 puffs into the lungs every 6 (six) hours as needed for wheezing or shortness of breath.   amLODipine 5 MG tablet Commonly known as:  NORVASC TAKE 1 TABLET (5 MG TOTAL) BY MOUTH DAILY.   atropine 1 % ophthalmic solution Place 1 drop into the right eye 2 (two) times daily.   ferrous sulfate 325 (65 FE) MG tablet Take 650 mg by mouth daily with breakfast.   lisinopril-hydrochlorothiazide 20-25 MG tablet Commonly known as:  PRINZIDE,ZESTORETIC Take 1 tablet by mouth daily.         EXAM:  BP (!) 136/96 (BP Location: Right Arm, Patient Position: Sitting, Cuff Size: Normal) Comment: pt states that BP has been running high at home  Pulse 86   Temp 98 F (36.7 C) (Oral)   Ht 5' 3.25" (1.607 m)   Wt 206 lb 4.8 oz (93.6 kg)   BMI 36.26 kg/m   Body mass index is 36.26 kg/m. Wt Readings from Last 3 Encounters:  09/24/17 206 lb 4.8 oz (93.6 kg)  05/17/17 211 lb 4 oz (95.8 kg)  12/04/16 207 lb 14.4 oz (94.3 kg)    Physical Exam: Vital signs reviewed [id number] is a well-developed well-nourished alert cooperative    who appearsr stated age in no acute distress.  HEENT: normocephalic atraumatic , Eyes: PERRL EOM's full, conjunctiva clear, Nares: paten,t no deformity discharge or tenderness., Ears: no deformity EAC's clear TMs with normal landmarks. Mouth: clear OP, no lesions, edema.  Moist mucous membranes. Dentition in adequate repair. NECK: supple without masses, thyromegaly or bruits. Thyroid palpable  No nodules  CHEST/PULM:  Clear to auscultation and percussion breath sounds equal no wheeze , rales or rhonchi. No chest wall deformities or tenderness. Breast: normal by inspection .  No dimpling, discharge, masses, tenderness or discharge . CV: PMI is nondisplaced, S1 S2 no  gallops, murmurs, rubs. Peripheral pulses are full without delay.No JVD .  ABDOMEN: Bowel sounds normal nontender  No guard or rebound, no hepato splenomegal no CVA tenderness.  Extremtities:  No clubbing cyanosis or edema, no acute joint swelling or redness no focal atrophy NEURO:  Oriented x3, cranial nerves 3-12 appear to be intact, no obvious focal weakness,gait within normal limits no abnormal reflexes or asymmetrical SKIN: No acute rashes normal turgor, color, no bruising or petechiae. PSYCH: Oriented, good eye contact, no obvious depression anxiety, cognition and judgment appear normal. LN: no cervical axillary inguinal adenopathy  Wt Readings from Last 3 Encounters:  09/24/17 206 lb 4.8 oz (93.6 kg)  05/17/17 211 lb 4 oz (95.8 kg)  12/04/16 207 lb 14.4 oz (94.3 kg)     BP Readings from Last 3 Encounters:  09/24/17 (!) 136/96  05/17/17 (!) 150/90  12/04/16 138/88    ASSESSMENT AND PLAN:  Discussed the following assessment and plan:  Visit for preventive health examination - Plan: Basic metabolic panel, CBC with Differential/Platelet, Hemoglobin A1c, Hepatic function panel, Lipid panel, TSH, T4, free  Medication management - Plan: Basic metabolic panel, CBC with Differential/Platelet, Hemoglobin A1c, Hepatic function panel, Lipid panel, TSH, T4, free  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 consider adding replacment s - Plan: Basic metabolic panel, CBC with Differential/Platelet, Hemoglobin A1c, Hepatic function panel, Lipid panel, TSH, T4, free  Hyperlipidemia, unspecified hyperlipidemia type - Plan: Basic metabolic panel, CBC with Differential/Platelet, Hemoglobin A1c, Hepatic function panel, Lipid panel, TSH, T4, free  Essential hypertension - not at goal but off med f or 3 weeks  but will augment /intesify lis hctz to 20 25 - Plan: Basic metabolic panel, CBC with Differential/Platelet, Hemoglobin A1c, Hepatic function panel, Lipid panel, TSH, T4, free  Fasting  hyperglycemia - Plan: Basic metabolic panel, CBC with Differential/Platelet, Hemoglobin A1c, Hepatic function panel, Lipid panel, TSH, T4, free  Sleep disturbance  Patient Care Team: Tacy Chavis, Standley Brooking, MD as PCP - Valetta Mole, MD (Obstetrics and Gynecology) Patient Instructions  bp goal 120/80 range   Will increase to lisinopril 20 /25 and stay on amlodipine 5   Plan rov in 2-3 mos for bp evaluation.   Attend to  lifestyle intervention healthy eating and exercise .  attenetion to sleep  Hygiene.   Will notify you  of labs when available.    Health Maintenance, Female Adopting a healthy lifestyle and getting preventive care can go a long way to promote health and wellness. Talk with your health care provider about what schedule of regular examinations is right for you. This is a good chance for you to check in with your provider about disease prevention and staying healthy. In between checkups, there are plenty of things you can do on your own. Experts have done a lot of research about which lifestyle changes and preventive measures are most likely to keep you healthy. Ask your health care provider for more information. Weight and diet Eat a healthy diet  Be sure to include plenty of vegetables, fruits, low-fat dairy products, and lean protein.  Do not eat a lot of foods high in solid fats, added sugars, or salt.  Get regular exercise. This is one of the most important things you can do for your health. ? Most adults should exercise for at least 150 minutes each week. The exercise should increase your heart rate and make  you sweat (moderate-intensity exercise). ? Most adults should also do strengthening exercises at least twice a week. This is in addition to the moderate-intensity exercise.  Maintain a healthy weight  Body mass index (BMI) is a measurement that can be used to identify possible weight problems. It estimates body fat based on height and weight. Your health  care provider can help determine your BMI and help you achieve or maintain a healthy weight.  For females 23 years of age and older: ? A BMI below 18.5 is considered underweight. ? A BMI of 18.5 to 24.9 is normal. ? A BMI of 25 to 29.9 is considered overweight. ? A BMI of 30 and above is considered obese.  Watch levels of cholesterol and blood lipids  You should start having your blood tested for lipids and cholesterol at 53 years of age, then have this test every 5 years.  You may need to have your cholesterol levels checked more often if: ? Your lipid or cholesterol levels are high. ? You are older than 53 years of age. ? You are at high risk for heart disease.  Cancer screening Lung Cancer  Lung cancer screening is recommended for adults 76-47 years old who are at high risk for lung cancer because of a history of smoking.  A yearly low-dose CT scan of the lungs is recommended for people who: ? Currently smoke. ? Have quit within the past 15 years. ? Have at least a 30-pack-year history of smoking. A pack year is smoking an average of one pack of cigarettes a day for 1 year.  Yearly screening should continue until it has been 15 years since you quit.  Yearly screening should stop if you develop a health problem that would prevent you from having lung cancer treatment.  Breast Cancer  Practice breast self-awareness. This means understanding how your breasts normally appear and feel.  It also means doing regular breast self-exams. Let your health care provider know about any changes, no matter how small.  If you are in your 20s or 30s, you should have a clinical breast exam (CBE) by a health care provider every 1-3 years as part of a regular health exam.  If you are 66 or older, have a CBE every year. Also consider having a breast X-ray (mammogram) every year.  If you have a family history of breast cancer, talk to your health care provider about genetic screening.  If you  are at high risk for breast cancer, talk to your health care provider about having an MRI and a mammogram every year.  Breast cancer gene (BRCA) assessment is recommended for women who have family members with BRCA-related cancers. BRCA-related cancers include: ? Breast. ? Ovarian. ? Tubal. ? Peritoneal cancers.  Results of the assessment will determine the need for genetic counseling and BRCA1 and BRCA2 testing.  Cervical Cancer Your health care provider may recommend that you be screened regularly for cancer of the pelvic organs (ovaries, uterus, and vagina). This screening involves a pelvic examination, including checking for microscopic changes to the surface of your cervix (Pap test). You may be encouraged to have this screening done every 3 years, beginning at age 34.  For women ages 61-65, health care providers may recommend pelvic exams and Pap testing every 3 years, or they may recommend the Pap and pelvic exam, combined with testing for human papilloma virus (HPV), every 5 years. Some types of HPV increase your risk of cervical cancer. Testing for HPV may  also be done on women of any age with unclear Pap test results.  Other health care providers may not recommend any screening for nonpregnant women who are considered low risk for pelvic cancer and who do not have symptoms. Ask your health care provider if a screening pelvic exam is right for you.  If you have had past treatment for cervical cancer or a condition that could lead to cancer, you need Pap tests and screening for cancer for at least 20 years after your treatment. If Pap tests have been discontinued, your risk factors (such as having a new sexual partner) need to be reassessed to determine if screening should resume. Some women have medical problems that increase the chance of getting cervical cancer. In these cases, your health care provider may recommend more frequent screening and Pap tests.  Colorectal Cancer  This type  of cancer can be detected and often prevented.  Routine colorectal cancer screening usually begins at 53 years of age and continues through 53 years of age.  Your health care provider may recommend screening at an earlier age if you have risk factors for colon cancer.  Your health care provider may also recommend using home test kits to check for hidden blood in the stool.  A small camera at the end of a tube can be used to examine your colon directly (sigmoidoscopy or colonoscopy). This is done to check for the earliest forms of colorectal cancer.  Routine screening usually begins at age 56.  Direct examination of the colon should be repeated every 5-10 years through 53 years of age. However, you may need to be screened more often if early forms of precancerous polyps or small growths are found.  Skin Cancer  Check your skin from head to toe regularly.  Tell your health care provider about any new moles or changes in moles, especially if there is a change in a mole's shape or color.  Also tell your health care provider if you have a mole that is larger than the size of a pencil eraser.  Always use sunscreen. Apply sunscreen liberally and repeatedly throughout the day.  Protect yourself by wearing long sleeves, pants, a wide-brimmed hat, and sunglasses whenever you are outside.  Heart disease, diabetes, and high blood pressure  High blood pressure causes heart disease and increases the risk of stroke. High blood pressure is more likely to develop in: ? People who have blood pressure in the high end of the normal range (130-139/85-89 mm Hg). ? People who are overweight or obese. ? People who are African American.  If you are 39-99 years of age, have your blood pressure checked every 3-5 years. If you are 66 years of age or older, have your blood pressure checked every year. You should have your blood pressure measured twice-once when you are at a hospital or clinic, and once when you  are not at a hospital or clinic. Record the average of the two measurements. To check your blood pressure when you are not at a hospital or clinic, you can use: ? An automated blood pressure machine at a pharmacy. ? A home blood pressure monitor.  If you are between 71 years and 71 years old, ask your health care provider if you should take aspirin to prevent strokes.  Have regular diabetes screenings. This involves taking a blood sample to check your fasting blood sugar level. ? If you are at a normal weight and have a low risk for diabetes, have  this test once every three years after 53 years of age. ? If you are overweight and have a high risk for diabetes, consider being tested at a younger age or more often. Preventing infection Hepatitis B  If you have a higher risk for hepatitis B, you should be screened for this virus. You are considered at high risk for hepatitis B if: ? You were born in a country where hepatitis B is common. Ask your health care provider which countries are considered high risk. ? Your parents were born in a high-risk country, and you have not been immunized against hepatitis B (hepatitis B vaccine). ? You have HIV or AIDS. ? You use needles to inject street drugs. ? You live with someone who has hepatitis B. ? You have had sex with someone who has hepatitis B. ? You get hemodialysis treatment. ? You take certain medicines for conditions, including cancer, organ transplantation, and autoimmune conditions.  Hepatitis C  Blood testing is recommended for: ? Everyone born from [birth year] through 1966. ? Anyone with known risk factors for hepatitis C.  Sexually transmitted infections (STIs)  You should be screened for sexually transmitted infections (STIs) including gonorrhea and chlamydia if: ? You are sexually active and are younger than 53 years of age. ? You are older than 53 years of age and your health care provider tells you that you are at risk for this type of  infection. ? Your sexual activity has changed since you were last screened and you are at an increased risk for chlamydia or gonorrhea. Ask your health care provider if you are at risk.  If you do not have HIV, but are at risk, it may be recommended that you take a prescription medicine daily to prevent HIV infection. This is called pre-exposure prophylaxis (PrEP). You are considered at risk if: ? You are sexually active and do not regularly use condoms or know the HIV status of your partner(s). ? You take drugs by injection. ? You are sexually active with a partner who has HIV.  Talk with your health care provider about whether you are at high risk of being infected with HIV. If you choose to begin PrEP, you should first be tested for HIV. You should then be tested every 3 months for as long as you are taking PrEP. Pregnancy  If you are premenopausal and you may become pregnant, ask your health care provider about preconception counseling.  If you may become pregnant, take 400 to 800 micrograms (mcg) of folic acid every day.  If you want to prevent pregnancy, talk to your health care provider about birth control (contraception). Osteoporosis and menopause  Osteoporosis is a disease in which the bones lose minerals and strength with aging. This can result in serious bone fractures. Your risk for osteoporosis can be identified using a bone density scan.  If you are 91 years of age or older, or if you are at risk for osteoporosis and fractures, ask your health care provider if you should be screened.  Ask your health care provider whether you should take a calcium or vitamin D supplement to lower your risk for osteoporosis.  Menopause may have certain physical symptoms and risks.  Hormone replacement therapy may reduce some of these symptoms and risks. Talk to your health care provider about whether hormone replacement therapy is right for you. Follow these instructions at home:  Schedule  regular health, dental, and eye exams.  Stay current with your immunizations.  Do  not use any tobacco products including cigarettes, chewing tobacco, or electronic cigarettes.  If you are pregnant, do not drink alcohol.  If you are breastfeeding, limit how much and how often you drink alcohol.  Limit alcohol intake to no more than 1 drink per day for nonpregnant women. One drink equals 12 ounces of beer, 5 ounces of wine, or 1 ounces of hard liquor.  Do not use street drugs.  Do not share needles.  Ask your health care provider for help if you need support or information about quitting drugs.  Tell your health care provider if you often feel depressed.  Tell your health care provider if you have ever been abused or do not feel safe at home. This information is not intended to replace advice given to you by your health care provider. Make sure you discuss any questions you have with your health care provider. Document Released: 11/14/2010 Document Revised: 10/07/2015 Document Reviewed: 02/02/2015 Elsevier Interactive Patient Education  2018 [employer].   Insomnia Insomnia is a sleep disorder that makes it difficult to fall asleep or to stay asleep. Insomnia can cause tiredness (fatigue), low energy, difficulty concentrating, mood swings, and poor performance at work or school. There are three different ways to classify insomnia:  Difficulty falling asleep.  Difficulty staying asleep.  Waking up too early in the morning.  Any type of insomnia can be long-term (chronic) or short-term (acute). Both are common. Short-term insomnia usually lasts for three months or less. Chronic insomnia occurs at least three times a week for longer than three months. What are the causes? Insomnia may be caused by another condition, situation, or substance, such as:  Anxiety.  Certain medicines.  Gastroesophageal reflux disease (GERD) or other gastrointestinal conditions.  Asthma or other  breathing conditions.  Restless legs syndrome, sleep apnea, or other sleep disorders.  Chronic pain.  Menopause. This may include hot flashes.  Stroke.  Abuse of alcohol, tobacco, or illegal drugs.  Depression.  Caffeine.  Neurological disorders, such as Alzheimer disease.  An overactive thyroid (hyperthyroidism).  The cause of insomnia may not be known. What increases the risk? Risk factors for insomnia include:  Gender. Women are more commonly affected than men.  Age. Insomnia is more common as you get older.  Stress. This may involve your professional or personal life.  Income. Insomnia is more common in people with lower income.  Lack of exercise.  Irregular work schedule or night shifts.  Traveling between different time zones.  What are the signs or symptoms? If you have insomnia, trouble falling asleep or trouble staying asleep is the main symptom. This may lead to other symptoms, such as:  Feeling fatigued.  Feeling nervous about going to sleep.  Not feeling rested in the morning.  Having trouble concentrating.  Feeling irritable, anxious, or depressed.  How is this treated? Treatment for insomnia depends on the cause. If your insomnia is caused by an underlying condition, treatment will focus on addressing the condition. Treatment may also include:  Medicines to help you sleep.  Counseling or therapy.  Lifestyle adjustments.  Follow these instructions at home:  Take medicines only as directed by your health care provider.  Keep regular sleeping and waking hours. Avoid naps.  Keep a sleep diary to help you and your health care provider figure out what could be causing your insomnia. Include: ? When you sleep. ? When you wake up during the night. ? How well you sleep. ? How rested you feel  the next day. ? Any side effects of medicines you are taking. ? What you eat and drink.  Make your bedroom a comfortable place where it is easy to  fall asleep: ? Put up shades or special blackout curtains to block light from outside. ? Use a white noise machine to block noise. ? Keep the temperature cool.  Exercise regularly as directed by your health care provider. Avoid exercising right before bedtime.  Use relaxation techniques to manage stress. Ask your health care provider to suggest some techniques that may work well for you. These may include: ? Breathing exercises. ? Routines to release muscle tension. ? Visualizing peaceful scenes.  Cut back on alcohol, caffeinated beverages, and cigarettes, especially close to bedtime. These can disrupt your sleep.  Do not overeat or eat spicy foods right before bedtime. This can lead to digestive discomfort that can make it hard for you to sleep.  Limit screen use before bedtime. This includes: ? Watching TV. ? Using your smartphone, tablet, and computer.  Stick to a routine. This can help you fall asleep faster. Try to do a quiet activity, brush your teeth, and go to bed at the same time each night.  Get out of bed if you are still awake after 15 minutes of trying to sleep. Keep the lights down, but try reading or doing a quiet activity. When you feel sleepy, go back to bed.  Make sure that you drive carefully. Avoid driving if you feel very sleepy.  Keep all follow-up appointments as directed by your health care provider. This is important. Contact a health care provider if:  You are tired throughout the day or have trouble in your daily routine due to sleepiness.  You continue to have sleep problems or your sleep problems get worse. Get help right away if:  You have serious thoughts about hurting yourself or someone else. This information is not intended to replace advice given to you by your health care provider. Make sure you discuss any questions you have with your health care provider. Document Released: 04/28/2000 Document Revised: 10/01/2015 Document Reviewed:  01/30/2014 Elsevier Interactive Patient Education  2018 [place]. Trang Bouse M.D.

## 2017-09-24 ENCOUNTER — Ambulatory Visit (INDEPENDENT_AMBULATORY_CARE_PROVIDER_SITE_OTHER): Payer: BLUE CROSS/BLUE SHIELD | Admitting: Internal Medicine

## 2017-09-24 ENCOUNTER — Encounter: Payer: Self-pay | Admitting: Internal Medicine

## 2017-09-24 VITALS — BP 136/96 | HR 86 | Temp 98.0°F | Ht 63.25 in | Wt 206.3 lb

## 2017-09-24 DIAGNOSIS — I10 Essential (primary) hypertension: Secondary | ICD-10-CM

## 2017-09-24 DIAGNOSIS — G479 Sleep disorder, unspecified: Secondary | ICD-10-CM

## 2017-09-24 DIAGNOSIS — Z0000 Encounter for general adult medical examination without abnormal findings: Secondary | ICD-10-CM

## 2017-09-24 DIAGNOSIS — E063 Autoimmune thyroiditis: Secondary | ICD-10-CM

## 2017-09-24 DIAGNOSIS — E785 Hyperlipidemia, unspecified: Secondary | ICD-10-CM | POA: Diagnosis not present

## 2017-09-24 DIAGNOSIS — Z79899 Other long term (current) drug therapy: Secondary | ICD-10-CM

## 2017-09-24 DIAGNOSIS — R7301 Impaired fasting glucose: Secondary | ICD-10-CM

## 2017-09-24 MED ORDER — LISINOPRIL-HYDROCHLOROTHIAZIDE 20-25 MG PO TABS: 1.0000 | ORAL_TABLET | Freq: Every day | ORAL | 3 refills | Status: DC

## 2017-09-24 MED ORDER — AMLODIPINE BESYLATE 5 MG PO TABS: ORAL_TABLET | ORAL | 3 refills | Status: DC

## 2017-09-24 NOTE — Patient Instructions (Addendum)
bp goal 120/80 range   Will increase to lisinopril 20 /25 and stay on amlodipine 5   Plan rov in 2-3 mos for bp evaluation.   Attend to  lifestyle intervention healthy eating and exercise .  attenetion to sleep  Hygiene.   Will notify you  of labs when available.    Health Maintenance, Female Adopting a healthy lifestyle and getting preventive care can go a long way to promote health and wellness. Talk with your health care provider about what schedule of regular examinations is right for you. This is a good chance for you to check in with your provider about disease prevention and staying healthy. In between checkups, there are plenty of things you can do on your own. Experts have done a lot of research about which lifestyle changes and preventive measures are most likely to keep you healthy. Ask your health care provider for more information. Weight and diet Eat a healthy diet  Be sure to include plenty of vegetables, fruits, low-fat dairy products, and lean protein.  Do not eat a lot of foods high in solid fats, added sugars, or salt.  Get regular exercise. This is one of the most important things you can do for your health. ? Most adults should exercise for at least 150 minutes each week. The exercise should increase your heart rate and make you sweat (moderate-intensity exercise). ? Most adults should also do strengthening exercises at least twice a week. This is in addition to the moderate-intensity exercise.  Maintain a healthy weight  Body mass index (BMI) is a measurement that can be used to identify possible weight problems. It estimates body fat based on height and weight. Your health care provider can help determine your BMI and help you achieve or maintain a healthy weight.  For females 53 years of age and older: ? A BMI below 18.5 is considered underweight. ? A BMI of 18.5 to 24.9 is normal. ? A BMI of 25 to 29.9 is considered overweight. ? A BMI of 30 and above is  considered obese.  Watch levels of cholesterol and blood lipids  You should start having your blood tested for lipids and cholesterol at 53 years of age, then have this test every 5 years.  You may need to have your cholesterol levels checked more often if: ? Your lipid or cholesterol levels are high. ? You are older than 53 years of age. ? You are at high risk for heart disease.  Cancer screening Lung Cancer  Lung cancer screening is recommended for adults 67-53 years old who are at high risk for lung cancer because of a history of smoking.  A yearly low-dose CT scan of the lungs is recommended for people who: ? Currently smoke. ? Have quit within the past 15 years. ? Have at least a 30-pack-year history of smoking. A pack year is smoking an average of one pack of cigarettes a day for 1 year.  Yearly screening should continue until it has been 15 years since you quit.  Yearly screening should stop if you develop a health problem that would prevent you from having lung cancer treatment.  Breast Cancer  Practice breast self-awareness. This means understanding how your breasts normally appear and feel.  It also means doing regular breast self-exams. Let your health care provider know about any changes, no matter how small.  If you are in your 20s or 30s, you should have a clinical breast exam (CBE) by a health care  provider every 1-3 years as part of a regular health exam.  If you are 53 or older, have a CBE every year. Also consider having a breast X-ray (mammogram) every year.  If you have a family history of breast cancer, talk to your health care provider about genetic screening.  If you are at high risk for breast cancer, talk to your health care provider about having an MRI and a mammogram every year.  Breast cancer gene (BRCA) assessment is recommended for women who have family members with BRCA-related cancers. BRCA-related cancers  include: ? Breast. ? Ovarian. ? Tubal. ? Peritoneal cancers.  Results of the assessment will determine the need for genetic counseling and BRCA1 and BRCA2 testing.  Cervical Cancer Your health care provider may recommend that you be screened regularly for cancer of the pelvic organs (ovaries, uterus, and vagina). This screening involves a pelvic examination, including checking for microscopic changes to the surface of your cervix (Pap test). You may be encouraged to have this screening done every 3 years, beginning at age 53.  For women ages 27-65, health care providers may recommend pelvic exams and Pap testing every 3 years, or they may recommend the Pap and pelvic exam, combined with testing for human papilloma virus (HPV), every 5 years. Some types of HPV increase your risk of cervical cancer. Testing for HPV may also be done on women of any age with unclear Pap test results.  Other health care providers may not recommend any screening for nonpregnant women who are considered low risk for pelvic cancer and who do not have symptoms. Ask your health care provider if a screening pelvic exam is right for you.  If you have had past treatment for cervical cancer or a condition that could lead to cancer, you need Pap tests and screening for cancer for at least 20 years after your treatment. If Pap tests have been discontinued, your risk factors (such as having a new sexual partner) need to be reassessed to determine if screening should resume. Some women have medical problems that increase the chance of getting cervical cancer. In these cases, your health care provider may recommend more frequent screening and Pap tests.  Colorectal Cancer  This type of cancer can be detected and often prevented.  Routine colorectal cancer screening usually begins at 53 years of age and continues through 53 years of age.  Your health care provider may recommend screening at an earlier age if you have risk factors  for colon cancer.  Your health care provider may also recommend using home test kits to check for hidden blood in the stool.  A small camera at the end of a tube can be used to examine your colon directly (sigmoidoscopy or colonoscopy). This is done to check for the earliest forms of colorectal cancer.  Routine screening usually begins at age 27.  Direct examination of the colon should be repeated every 5-10 years through 53 years of age. However, you may need to be screened more often if early forms of precancerous polyps or small growths are found.  Skin Cancer  Check your skin from head to toe regularly.  Tell your health care provider about any new moles or changes in moles, especially if there is a change in a mole's shape or color.  Also tell your health care provider if you have a mole that is larger than the size of a pencil eraser.  Always use sunscreen. Apply sunscreen liberally and repeatedly throughout the day.  Protect yourself by wearing long sleeves, pants, a wide-brimmed hat, and sunglasses whenever you are outside.  Heart disease, diabetes, and high blood pressure  High blood pressure causes heart disease and increases the risk of stroke. High blood pressure is more likely to develop in: ? People who have blood pressure in the high end of the normal range (130-139/85-89 mm Hg). ? People who are overweight or obese. ? People who are African American.  If you are 24-55 years of age, have your blood pressure checked every 3-5 years. If you are 23 years of age or older, have your blood pressure checked every year. You should have your blood pressure measured twice-once when you are at a hospital or clinic, and once when you are not at a hospital or clinic. Record the average of the two measurements. To check your blood pressure when you are not at a hospital or clinic, you can use: ? An automated blood pressure machine at a pharmacy. ? A home blood pressure monitor.  If  you are between 20 years and 53 years old, ask your health care provider if you should take aspirin to prevent strokes.  Have regular diabetes screenings. This involves taking a blood sample to check your fasting blood sugar level. ? If you are at a normal weight and have a low risk for diabetes, have this test once every three years after 52 years of age. ? If you are overweight and have a high risk for diabetes, consider being tested at a younger age or more often. Preventing infection Hepatitis B  If you have a higher risk for hepatitis B, you should be screened for this virus. You are considered at high risk for hepatitis B if: ? You were born in a country where hepatitis B is common. Ask your health care provider which countries are considered high risk. ? Your parents were born in a high-risk country, and you have not been immunized against hepatitis B (hepatitis B vaccine). ? You have HIV or AIDS. ? You use needles to inject street drugs. ? You live with someone who has hepatitis B. ? You have had sex with someone who has hepatitis B. ? You get hemodialysis treatment. ? You take certain medicines for conditions, including cancer, organ transplantation, and autoimmune conditions.  Hepatitis C  Blood testing is recommended for: ? Everyone born from [birth year] through 1966. ? Anyone with known risk factors for hepatitis C.  Sexually transmitted infections (STIs)  You should be screened for sexually transmitted infections (STIs) including gonorrhea and chlamydia if: ? You are sexually active and are younger than 53 years of age. ? You are older than 53 years of age and your health care provider tells you that you are at risk for this type of infection. ? Your sexual activity has changed since you were last screened and you are at an increased risk for chlamydia or gonorrhea. Ask your health care provider if you are at risk.  If you do not have HIV, but are at risk, it may be recommended  that you take a prescription medicine daily to prevent HIV infection. This is called pre-exposure prophylaxis (PrEP). You are considered at risk if: ? You are sexually active and do not regularly use condoms or know the HIV status of your partner(s). ? You take drugs by injection. ? You are sexually active with a partner who has HIV.  Talk with your health care provider about whether you are at high risk of  being infected with HIV. If you choose to begin PrEP, you should first be tested for HIV. You should then be tested every 3 months for as long as you are taking PrEP. Pregnancy  If you are premenopausal and you may become pregnant, ask your health care provider about preconception counseling.  If you may become pregnant, take 400 to 800 micrograms (mcg) of folic acid every day.  If you want to prevent pregnancy, talk to your health care provider about birth control (contraception). Osteoporosis and menopause  Osteoporosis is a disease in which the bones lose minerals and strength with aging. This can result in serious bone fractures. Your risk for osteoporosis can be identified using a bone density scan.  If you are 77 years of age or older, or if you are at risk for osteoporosis and fractures, ask your health care provider if you should be screened.  Ask your health care provider whether you should take a calcium or vitamin D supplement to lower your risk for osteoporosis.  Menopause may have certain physical symptoms and risks.  Hormone replacement therapy may reduce some of these symptoms and risks. Talk to your health care provider about whether hormone replacement therapy is right for you. Follow these instructions at home:  Schedule regular health, dental, and eye exams.  Stay current with your immunizations.  Do not use any tobacco products including cigarettes, chewing tobacco, or electronic cigarettes.  If you are pregnant, do not drink alcohol.  If you are  breastfeeding, limit how much and how often you drink alcohol.  Limit alcohol intake to no more than 1 drink per day for nonpregnant women. One drink equals 12 ounces of beer, 5 ounces of wine, or 1 ounces of hard liquor.  Do not use street drugs.  Do not share needles.  Ask your health care provider for help if you need support or information about quitting drugs.  Tell your health care provider if you often feel depressed.  Tell your health care provider if you have ever been abused or do not feel safe at home. This information is not intended to replace advice given to you by your health care provider. Make sure you discuss any questions you have with your health care provider. Document Released: 11/14/2010 Document Revised: 10/07/2015 Document Reviewed: 02/02/2015 Elsevier Interactive Patient Education  2018 [employer].   Insomnia Insomnia is a sleep disorder that makes it difficult to fall asleep or to stay asleep. Insomnia can cause tiredness (fatigue), low energy, difficulty concentrating, mood swings, and poor performance at work or school. There are three different ways to classify insomnia:  Difficulty falling asleep.  Difficulty staying asleep.  Waking up too early in the morning.  Any type of insomnia can be long-term (chronic) or short-term (acute). Both are common. Short-term insomnia usually lasts for three months or less. Chronic insomnia occurs at least three times a week for longer than three months. What are the causes? Insomnia may be caused by another condition, situation, or substance, such as:  Anxiety.  Certain medicines.  Gastroesophageal reflux disease (GERD) or other gastrointestinal conditions.  Asthma or other breathing conditions.  Restless legs syndrome, sleep apnea, or other sleep disorders.  Chronic pain.  Menopause. This may include hot flashes.  Stroke.  Abuse of alcohol, tobacco, or illegal  drugs.  Depression.  Caffeine.  Neurological disorders, such as Alzheimer disease.  An overactive thyroid (hyperthyroidism).  The cause of insomnia may not be known. What increases the risk? Risk  factors for insomnia include:  Gender. Women are more commonly affected than men.  Age. Insomnia is more common as you get older.  Stress. This may involve your professional or personal life.  Income. Insomnia is more common in people with lower income.  Lack of exercise.  Irregular work schedule or night shifts.  Traveling between different time zones.  What are the signs or symptoms? If you have insomnia, trouble falling asleep or trouble staying asleep is the main symptom. This may lead to other symptoms, such as:  Feeling fatigued.  Feeling nervous about going to sleep.  Not feeling rested in the morning.  Having trouble concentrating.  Feeling irritable, anxious, or depressed.  How is this treated? Treatment for insomnia depends on the cause. If your insomnia is caused by an underlying condition, treatment will focus on addressing the condition. Treatment may also include:  Medicines to help you sleep.  Counseling or therapy.  Lifestyle adjustments.  Follow these instructions at home:  Take medicines only as directed by your health care provider.  Keep regular sleeping and waking hours. Avoid naps.  Keep a sleep diary to help you and your health care provider figure out what could be causing your insomnia. Include: ? When you sleep. ? When you wake up during the night. ? How well you sleep. ? How rested you feel the next day. ? Any side effects of medicines you are taking. ? What you eat and drink.  Make your bedroom a comfortable place where it is easy to fall asleep: ? Put up shades or special blackout curtains to block light from outside. ? Use a white noise machine to block noise. ? Keep the temperature cool.  Exercise regularly as directed by  your health care provider. Avoid exercising right before bedtime.  Use relaxation techniques to manage stress. Ask your health care provider to suggest some techniques that may work well for you. These may include: ? Breathing exercises. ? Routines to release muscle tension. ? Visualizing peaceful scenes.  Cut back on alcohol, caffeinated beverages, and cigarettes, especially close to bedtime. These can disrupt your sleep.  Do not overeat or eat spicy foods right before bedtime. This can lead to digestive discomfort that can make it hard for you to sleep.  Limit screen use before bedtime. This includes: ? Watching TV. ? Using your smartphone, tablet, and computer.  Stick to a routine. This can help you fall asleep faster. Try to do a quiet activity, brush your teeth, and go to bed at the same time each night.  Get out of bed if you are still awake after 15 minutes of trying to sleep. Keep the lights down, but try reading or doing a quiet activity. When you feel sleepy, go back to bed.  Make sure that you drive carefully. Avoid driving if you feel very sleepy.  Keep all follow-up appointments as directed by your health care provider. This is important. Contact a health care provider if:  You are tired throughout the day or have trouble in your daily routine due to sleepiness.  You continue to have sleep problems or your sleep problems get worse. Get help right away if:  You have serious thoughts about hurting yourself or someone else. This information is not intended to replace advice given to you by your health care provider. Make sure you discuss any questions you have with your health care provider. Document Released: 04/28/2000 Document Revised: 10/01/2015 Document Reviewed: 01/30/2014 Elsevier Interactive Patient Education  2018 [place].

## 2017-09-25 LAB — CBC WITH DIFFERENTIAL/PLATELET
BASOS PCT: 3.5 % — AB (ref 0.0–3.0)
Basophils Absolute: 0.2 10*3/uL — ABNORMAL HIGH (ref 0.0–0.1)
EOS PCT: 3.6 % (ref 0.0–5.0)
Eosinophils Absolute: 0.2 10*3/uL (ref 0.0–0.7)
HCT: 39.5 % (ref 36.0–46.0)
HEMOGLOBIN: 13 g/dL (ref 12.0–15.0)
LYMPHS ABS: 2 10*3/uL (ref 0.7–4.0)
Lymphocytes Relative: 40.7 % (ref 12.0–46.0)
MCHC: 33 g/dL (ref 30.0–36.0)
MCV: 79 fl (ref 78.0–100.0)
MONO ABS: 0.4 10*3/uL (ref 0.1–1.0)
Monocytes Relative: 8.3 % (ref 3.0–12.0)
NEUTROS PCT: 43.9 % (ref 43.0–77.0)
Neutro Abs: 2.2 10*3/uL (ref 1.4–7.7)
Platelets: 247 10*3/uL (ref 150.0–400.0)
RBC: 5 Mil/uL (ref 3.87–5.11)
RDW: 13.8 % (ref 11.5–15.5)
WBC: 5 10*3/uL (ref 4.0–10.5)

## 2017-09-25 LAB — T4, FREE: Free T4: 0.6 ng/dL (ref 0.60–1.60)

## 2017-09-25 LAB — HEPATIC FUNCTION PANEL
ALBUMIN: 4.1 g/dL (ref 3.5–5.2)
ALT: 13 U/L (ref 0–35)
AST: 14 U/L (ref 0–37)
Alkaline Phosphatase: 65 U/L (ref 39–117)
BILIRUBIN TOTAL: 0.3 mg/dL (ref 0.2–1.2)
Bilirubin, Direct: 0.1 mg/dL (ref 0.0–0.3)
Total Protein: 7.3 g/dL (ref 6.0–8.3)

## 2017-09-25 LAB — BASIC METABOLIC PANEL
BUN: 10 mg/dL (ref 6–23)
CHLORIDE: 104 meq/L (ref 96–112)
CO2: 30 mEq/L (ref 19–32)
Calcium: 9.7 mg/dL (ref 8.4–10.5)
Creatinine, Ser: 0.75 mg/dL (ref 0.40–1.20)
GFR: 104.07 mL/min (ref >60.00)
Glucose, Bld: 70 mg/dL (ref 70–99)
POTASSIUM: 3.8 meq/L (ref 3.5–5.1)
Sodium: 141 mEq/L (ref 135–145)

## 2017-09-25 LAB — LIPID PANEL
CHOLESTEROL: 199 mg/dL (ref 0–200)
HDL: 50.5 mg/dL (ref >39.00)
LDL CALC: 126 mg/dL — AB (ref 0–99)
NonHDL: 148.99
TRIGLYCERIDES: 116 mg/dL (ref 0.0–149.0)
Total CHOL/HDL Ratio: 4
VLDL: 23.2 mg/dL (ref 0.0–40.0)

## 2017-09-25 LAB — HEMOGLOBIN A1C: HEMOGLOBIN A1C: 4.7 % (ref 4.6–6.5)

## 2017-09-25 LAB — TSH: TSH: 4.1 u[IU]/mL (ref 0.35–4.50)

## 2017-10-09 ENCOUNTER — Ambulatory Visit
Admission: RE | Admit: 2017-10-09 | Discharge: 2017-10-09 | Disposition: A | Discharge status: Home / Self Care | Payer: BLUE CROSS/BLUE SHIELD | Source: Ambulatory Visit | Attending: Internal Medicine | Admitting: Internal Medicine

## 2017-10-09 ENCOUNTER — Other Ambulatory Visit: Payer: Self-pay | Admitting: Internal Medicine

## 2017-10-09 DIAGNOSIS — Z131 Encounter for screening for diabetes mellitus: Secondary | ICD-10-CM | POA: Diagnosis not present

## 2017-10-09 DIAGNOSIS — Z1231 Encounter for screening mammogram for malignant neoplasm of breast: Secondary | ICD-10-CM

## 2017-10-09 DIAGNOSIS — Z136 Encounter for screening for cardiovascular disorders: Secondary | ICD-10-CM | POA: Diagnosis not present

## 2017-10-09 DIAGNOSIS — I10 Essential (primary) hypertension: Secondary | ICD-10-CM | POA: Diagnosis not present

## 2017-10-09 DIAGNOSIS — Z13220 Encounter for screening for lipoid disorders: Secondary | ICD-10-CM | POA: Diagnosis not present

## 2018-01-22 DIAGNOSIS — H35351 Cystoid macular degeneration, right eye: Secondary | ICD-10-CM | POA: Diagnosis not present

## 2018-01-22 DIAGNOSIS — H2513 Age-related nuclear cataract, bilateral: Secondary | ICD-10-CM | POA: Diagnosis not present

## 2018-01-22 DIAGNOSIS — H2000 Unspecified acute and subacute iridocyclitis: Secondary | ICD-10-CM | POA: Diagnosis not present

## 2018-01-22 DIAGNOSIS — H21541 Posterior synechiae (iris), right eye: Secondary | ICD-10-CM | POA: Diagnosis not present

## 2018-04-05 ENCOUNTER — Ambulatory Visit (INDEPENDENT_AMBULATORY_CARE_PROVIDER_SITE_OTHER): Payer: BLUE CROSS/BLUE SHIELD | Admitting: Family Medicine

## 2018-04-05 ENCOUNTER — Encounter: Payer: Self-pay | Admitting: Family Medicine

## 2018-04-05 VITALS — BP 150/104 | HR 89 | Temp 98.5°F | Resp 18 | Wt 211.0 lb

## 2018-04-05 DIAGNOSIS — R062 Wheezing: Secondary | ICD-10-CM

## 2018-04-05 MED ORDER — PREDNISONE 50 MG PO TABS: 50.0000 mg | ORAL_TABLET | Freq: Every day | ORAL | 0 refills | Status: DC

## 2018-04-05 MED ORDER — METHYLPREDNISOLONE ACETATE 80 MG/ML IJ SUSP
80.0000 mg | Freq: Once | INTRAMUSCULAR | Status: AC
  Administered 2018-04-05: 80 mg via INTRAMUSCULAR

## 2018-04-05 MED ORDER — IPRATROPIUM-ALBUTEROL 0.5-2.5 (3) MG/3ML IN SOLN
3.0000 mL | Freq: Once | RESPIRATORY_TRACT | Status: AC
  Administered 2018-04-05: 3 mL via RESPIRATORY_TRACT

## 2018-04-05 NOTE — Progress Notes (Signed)
Monica Estrada - 52 y.o. female MRN [id number]  Date of birth: June 13, 1965  SUBJECTIVE:  Including CC & ROS.  No chief complaint on file.   Monica Estrada is a 52 y.o. female that is presenting with wheezing.  This started yesterday and got progressively worse last night.  She has a history of similar symptoms.  They occur about once a year.  She denies any asthma or COPD.  She has been using her inhaler with no improvement.  Has had significant coughing overnight.  She last used albuterol at 2 AM this morning.  Denies any fevers or chills.  Has no smoking history.  Her last episode was in January..  Independent review of the chest x-ray from 1/4 shows no active disease.   Review of Systems  Constitutional: Negative for fever.  HENT: Negative for congestion.   Respiratory: Positive for cough and wheezing.   Gastrointestinal: Negative for abdominal pain.  Musculoskeletal: Negative for back pain.  Skin: Negative for color change.  Neurological: Negative for weakness.  Hematological: Negative for adenopathy.  Psychiatric/Behavioral: Negative for agitation.    HISTORY: Past Medical, Surgical, Social, and Family History Reviewed & Updated per EMR.   Pertinent Historical Findings include:  Past Medical History:  Diagnosis Date  . Abnormal Pap smear   . Colonic edema    mild left- ed eval abd ct  . Colonic edema   . Gave birth to child recently    2012  . History of hypothyroidism 1999   of post partum  . Hx of abnormal cervical Pap smear   . Hypertension   . Uveitis    stable  . Wheezing 04/01/2015    Past Surgical History:  Procedure Laterality Date  . CHOLECYSTECTOMY     1999  . TUBAL LIGATION  12/31/2010   Procedure: BILATERAL TUBAL LIGATION;  Surgeon: Blane Ohara Meisinger;  Location: [facility];  Service: Gynecology;  Laterality: Bilateral;  Bilateral post partum tubal ligation    No Known Allergies  Family History  Problem Relation Age of Onset  . Hypertension Mother     . Diabetes Mother   . Thyroid disease Mother   . Diabetes Father   . Hypertension Father   . Lupus Sister   . Rheum arthritis Sister   . Other Sister        lamb disease  . Diabetes Brother   . Diabetes Son   . Stroke Sister      Social History   Socioeconomic History  . Marital status: Single    Spouse name: Not on file  . Number of children: Not on file  . Years of education: Not on file  . Highest education level: Not on file  Occupational History  . Not on file  Social Needs  . Financial resource strain: Not on file  . Food insecurity:    Worry: Not on file    Inability: Not on file  . Transportation needs:    Medical: Not on file    Non-medical: Not on file  Tobacco Use  . Smoking status: Never Smoker  . Smokeless tobacco: Never Used  Substance and Sexual Activity  . Alcohol use: No  . Drug use: No  . Sexual activity: Not Currently  Lifestyle  . Physical activity:    Days per week: Not on file    Minutes per session: Not on file  . Stress: Not on file  Relationships  . Social connections:    Talks on phone:  Not on file    Gets together: Not on file    Attends religious service: Not on file    Active member of club or organization: Not on file    Attends meetings of clubs or organizations: Not on file    Relationship status: Not on file  . Intimate partner violence:    Fear of current or ex partner: Not on file    Emotionally abused: Not on file    Physically abused: Not on file    Forced sexual activity: Not on file  Other Topics Concern  . Not on file  Social History Narrative   [name]  ABOUT 17 HOURS    Single   HH of 3 son  59 y  And baby girl 2 year  Father out of country comes back ocass. Some financial support .    Father  In [place].   No pets.    Neg ets .    2 jobs  hh of 2  97 yo and older teen son college                  PHYSICAL EXAM:  VS: BP (!) 150/104   Pulse 89   Temp 98.5 F (36.9 C) (Oral)   Resp 18   Wt 211 lb (95.7  kg)   SpO2 95%   BMI 37.08 kg/m  Physical Exam Gen: NAD, alert, cooperative with exam,  ENT: normal lips, normal nasal mucosa,  Eye: normal EOM, normal conjunctiva and lids CV:  no edema, +2 pedal pulses, regular rate and rhythm, S1-S2   Resp: no accessory muscle use, non-labored, significant and expiratory wheezing heard bilaterally, speaking in normal sentences  Skin: no rashes, no areas of induration  Neuro: normal tone, normal sensation to touch Psych:  normal insight, alert and oriented MSK: Normal gait, normal strength       ASSESSMENT & PLAN:   Wheezing Significant wheezing on exam.  Unclear if is reactive.  No history of asthma or COPD.  Non-smoker. -Duo nebs and 80 mg depo -Provided prednisone -Instructed to use albuterol every 4-6 hours over the course of the next 1 to 2 days -Provided indications to follow-up or seek more immediate care

## 2018-04-05 NOTE — Assessment & Plan Note (Signed)
Significant wheezing on exam.  Unclear if is reactive.  No history of asthma or COPD.  Non-smoker. -Duo nebs and 80 mg depo -Provided prednisone -Instructed to use albuterol every 4-6 hours over the course of the next 1 to 2 days -Provided indications to follow-up or seek more immediate care

## 2018-04-05 NOTE — Patient Instructions (Signed)
Nice to meet you  Please use the prednisone if your wheezing hasn't improving  Please follow up with your regular doctor if your symptoms don't improve  Please be seen in the emergency room if your symptoms get worse.

## 2018-06-15 ENCOUNTER — Ambulatory Visit (INDEPENDENT_AMBULATORY_CARE_PROVIDER_SITE_OTHER): Payer: BLUE CROSS/BLUE SHIELD | Admitting: Family Medicine

## 2018-06-15 ENCOUNTER — Encounter: Payer: Self-pay | Admitting: Family Medicine

## 2018-06-15 VITALS — BP 164/102 | HR 90 | Temp 98.8°F | Resp 18 | Ht 63.25 in | Wt 207.0 lb

## 2018-06-15 DIAGNOSIS — R030 Elevated blood-pressure reading, without diagnosis of hypertension: Secondary | ICD-10-CM

## 2018-06-15 DIAGNOSIS — R059 Cough, unspecified: Secondary | ICD-10-CM

## 2018-06-15 DIAGNOSIS — R05 Cough: Secondary | ICD-10-CM

## 2018-06-15 MED ORDER — AZITHROMYCIN 250 MG PO TABS: ORAL_TABLET | ORAL | 0 refills | Status: DC

## 2018-06-15 MED ORDER — GUAIFENESIN-CODEINE 100-10 MG/5ML PO SOLN: 5.0000 mL | Freq: Three times a day (TID) | ORAL | 0 refills | Status: DC | PRN

## 2018-06-15 MED ORDER — ALBUTEROL SULFATE HFA 108 (90 BASE) MCG/ACT IN AERS
2.0000 | INHALATION_SPRAY | Freq: Four times a day (QID) | RESPIRATORY_TRACT | 1 refills | Status: DC | PRN
Start: 2018-06-15 — End: 2019-06-30

## 2018-06-15 MED ORDER — PREDNISONE 50 MG PO TABS: ORAL_TABLET | ORAL | 0 refills | Status: DC

## 2018-06-15 MED ORDER — METHYLPREDNISOLONE ACETATE 80 MG/ML IJ SUSP
80.0000 mg | Freq: Once | INTRAMUSCULAR | Status: AC
  Administered 2018-06-15: 80 mg via INTRAMUSCULAR

## 2018-06-15 MED ORDER — IPRATROPIUM-ALBUTEROL 0.5-2.5 (3) MG/3ML IN SOLN
3.0000 mL | Freq: Once | RESPIRATORY_TRACT | Status: AC
Start: 2018-06-15 — End: 2018-06-15
  Administered 2018-06-15: 3 mL via RESPIRATORY_TRACT

## 2018-06-15 NOTE — Patient Instructions (Addendum)
We will get you a steroid injection today.  Please start the prednisone and Z-Pak today.  Please use the cough syrup as needed.  Please stay well-hydrated and get plenty of rest these next few days.  Keep an eye on your blood pressure and let us know if persistently 140/90 or higher.  Please let me know if your symptoms worsen or fail to improve.  Take care, Dr Jerline Pain

## 2018-06-15 NOTE — Progress Notes (Signed)
   Chief Complaint:  Monica Estrada is a 53 y.o. female who presents for same day appointment with a chief complaint of cough.   Assessment/Plan:  Cough/Wheeze Duoneb given today with modest improvement in her symptoms.  She has no known history of asthma or COPD.  Given severity and duration of symptoms, we will treat with a Z-Pak today.  Also give 80 mg of IM Depo-Medrol.  Start prednisone burst.  Will refill her albuterol inhaler.  Send in guaifenesin-codeine cough syrup.  Discussed reasons to return to care and seek emergent care.  Consider pulm referral if continues to have recurrent bouts of cough/wheeze.  Elevated BP Reading Elevated in setting of acute illness and decongestant use.  Discussed home blood pressure monitoring with goal 140/90 or lower.  Recommended follow-up with PCP soon if blood pressures not at goal.     Subjective:  HPI:  Cough, acute problem Started a couple of weeks ago. Worsened over that time. Associated with wheezing, sputum production, and mild shortness of breath on exertion. No fevers or chills. No body aches. Tried OTC meds which have not helped. Had some left over albuterol and has used that has not helped significantly. Symptoms are worse at night. No other obvious alleviating or aggravating factors.  No history of asthma or COPD.   ROS: Per HPI  PMH: She reports that she has never smoked. She has never used smokeless tobacco. She reports that she does not drink alcohol or use drugs.      Objective:  Physical Exam: BP (!) 164/102   Pulse 90   Temp 98.8 F (37.1 C) (Oral)   Resp 18   Ht 5' 3.25" (1.607 m)   Wt 207 lb (93.9 kg)   SpO2 95%   BMI 36.38 kg/m   Gen: NAD, resting comfortably HEENT: TMs clear.  OP erythematous with no exudate. CV: Regular rate and rhythm with no murmurs appreciated Pulm: Minimally increased work of breathing.  Diffuse rhonchi scattered throughout with mild end expiratory wheeze at bilateral bases. EXT: No  cyanosis or edema.     Monica Estrada. Monica Pain, MD 06/15/2018 9:00 AM

## 2018-06-29 ENCOUNTER — Encounter: Payer: Self-pay | Admitting: Family Medicine

## 2018-06-29 ENCOUNTER — Ambulatory Visit (INDEPENDENT_AMBULATORY_CARE_PROVIDER_SITE_OTHER): Payer: BLUE CROSS/BLUE SHIELD | Admitting: Family Medicine

## 2018-06-29 VITALS — BP 150/96 | HR 93 | Temp 100.5°F | Ht 63.25 in | Wt 202.8 lb

## 2018-06-29 DIAGNOSIS — J101 Influenza due to other identified influenza virus with other respiratory manifestations: Secondary | ICD-10-CM

## 2018-06-29 DIAGNOSIS — R509 Fever, unspecified: Secondary | ICD-10-CM

## 2018-06-29 LAB — POCT INFLUENZA A/B
INFLUENZA A, POC: POSITIVE — AB
INFLUENZA B, POC: NEGATIVE

## 2018-06-29 MED ORDER — OSELTAMIVIR PHOSPHATE 75 MG PO CAPS: 75.0000 mg | ORAL_CAPSULE | Freq: Two times a day (BID) | ORAL | 0 refills | Status: DC

## 2018-06-29 MED ORDER — GUAIFENESIN-CODEINE 100-10 MG/5ML PO SOLN: 5.0000 mL | Freq: Three times a day (TID) | ORAL | 0 refills | Status: DC | PRN

## 2018-06-29 NOTE — Progress Notes (Signed)
Subjective:    Patient ID: Monica Estrada, female    DOB: May 08, 1966, 53 y.o.   MRN: [id number]  HPI   Patient presents to clinic complaining of fever and chills, body aches, congestion, ear pain, headache and overall feeling of low energy.  States her daughter was positive for the flu and was diagnosed earlier this week.  She has been taking some Tylenol with minor help in reducing aches and also has been using Mucinex cough medicine.  Patient symptoms have been present for 1 to 2 days.  Denies shortness of breath or wheezing.  Denies chest pain.  Denies nausea/vomiting or diarrhea.  Patient Active Problem List   Diagnosis Date Noted  . Wheezing 04/01/2015  . Essential hypertension 04/17/2014  . TSH elevation 04/17/2014  . Medication management 04/17/2014  . History of hypokalemia 11/26/2012  . Tingling 11/26/2012  . BMI 32.0-32.9,adult 01/08/2012  . Umbilical hernia  possible from lap site  12/05/2011  . Preventative health care 11/12/2010  . Uveitis   . History of hypothyroidism   . NONSPECIFIC ABN FINDING RAD & OTH EXAM GI TRACT 11/25/2009  . Anemia 11/03/2009  . AUTOIMMUNE THYROIDITIS 11/20/2008  . UVEITIS 12/12/2006  . Hyperlipemia 12/11/2006   Social History   Tobacco Use  . Smoking status: Never Smoker  . Smokeless tobacco: Never Used  Substance Use Topics  . Alcohol use: No    Review of Systems  Constitutional:+chills, fatigue and fever.  HENT: +congestion, ear pain, sinus pain. No sore throat.   Eyes: Negative.   Respiratory: Negative for shortness of breath and wheezing. +cough Cardiovascular: Negative for chest pain, palpitations and leg swelling.  Gastrointestinal: Negative for abdominal pain, diarrhea, nausea and vomiting.  Genitourinary: Negative for dysuria, frequency and urgency.  Musculoskeletal: +body aches.  Skin: Negative for color change, pallor and rash.  Neurological: Negative for syncope, light-headedness and headaches.    Psychiatric/Behavioral: The patient is not nervous/anxious.       Objective:   Physical Exam Vitals signs and nursing note reviewed.  Constitutional:      General: She is not in acute distress.    Appearance: She is ill-appearing (appears tired). She is not toxic-appearing.  HENT:     Head: Normocephalic and atraumatic.     Nose: Congestion and rhinorrhea present.     Mouth/Throat:     Mouth: Mucous membranes are moist.     Pharynx: Posterior oropharyngeal erythema present. No oropharyngeal exudate.  Eyes:     General: No scleral icterus.       Right eye: No discharge.        Left eye: No discharge.     Extraocular Movements: Extraocular movements intact.     Conjunctiva/sclera: Conjunctivae normal.  Neck:     Musculoskeletal: Neck supple. No neck rigidity.  Cardiovascular:     Rate and Rhythm: Normal rate and regular rhythm.  Pulmonary:     Effort: Pulmonary effort is normal. No respiratory distress.     Breath sounds: Normal breath sounds. No wheezing, rhonchi or rales.  Abdominal:     General: Bowel sounds are normal.     Palpations: Abdomen is soft.     Tenderness: There is no abdominal tenderness.  Lymphadenopathy:     Cervical: No cervical adenopathy.  Skin:    General: Skin is warm and dry.  Neurological:     Mental Status: She is alert and oriented to person, place, and time.  Psychiatric:        Mood  and Affect: Mood normal.        Behavior: Behavior normal.    Vitals:   06/29/18 1028  BP: (!) 150/96  Pulse: 93  Temp: (!) 100.5 F (38.1 C)  SpO2: 97%      Assessment & Plan:   Influenza A - patient's point-of-care swab is positive for influenza A.  This explains all of patient's symptoms.  She will take Tamiflu twice daily for 5 days, use Robitussin with codeine cough syrup as needed to reduce cough, alternate Tylenol Motrin as needed for body aches/fever, get plenty of rest, increase fluid intake and do good handwashing.  Patient advised she is  considered contagious from 5 to 7 days of symptom onset and he has been given a work note to be out of work for all of next week.  She will keep regularly scheduled follow-up with PCP as planned.  Advised to return to clinic sooner if symptoms persist or worsen, do not get better as expected.

## 2018-06-29 NOTE — Patient Instructions (Signed)
Take Tamiflu twice daily for 5 days.  Use Robitussin with codeine cough syrup as needed to reduce cough.  Drink plenty of fluids, get lots of rest and do good handwashing.  You may alternate Tylenol and Motrin as needed to help reduce fever and aches.  You are considered contagious from 5 to 7 days of symptom onset, so remain home as much as possible over the next 5 to 7 days.  Influenza, Adult Influenza is also called "the flu." It is an infection in the lungs, nose, and throat (respiratory tract). It is caused by a virus. The flu causes symptoms that are similar to symptoms of a cold. It also causes a high fever and body aches. The flu spreads easily from person to person (is contagious). Getting a flu shot (influenza vaccination) every year is the best way to prevent the flu. What are the causes? This condition is caused by the influenza virus. You can get the virus by:  Breathing in droplets that are in the air from the cough or sneeze of a person who has the virus.  Touching something that has the virus on it (is contaminated) and then touching your mouth, nose, or eyes. What increases the risk? Certain things may make you more likely to get the flu. These include:  Not washing your hands often.  Having close contact with many people during cold and flu season.  Touching your mouth, eyes, or nose without first washing your hands.  Not getting a flu shot every year. You may have a higher risk for the flu, along with serious problems such as a lung infection (pneumonia), if you:  Are older than 65.  Are pregnant.  Have a weakened disease-fighting system (immune system) because of a disease or taking certain medicines.  Have a long-term (chronic) illness, such as: ? Heart, kidney, or lung disease. ? Diabetes. ? Asthma.  Have a liver disorder.  Are very overweight (morbidly obese).  Have anemia. This is a condition that affects your red blood cells. What are the signs or  symptoms? Symptoms usually begin suddenly and last 4-14 days. They may include:  Fever and chills.  Headaches, body aches, or muscle aches.  Sore throat.  Cough.  Runny or stuffy (congested) nose.  Chest discomfort.  Not wanting to eat as much as normal (poor appetite).  Weakness or feeling tired (fatigue).  Dizziness.  Feeling sick to your stomach (nauseous) or throwing up (vomiting). How is this treated? If the flu is found early, you can be treated with medicine that can help reduce how bad the illness is and how long it lasts (antiviral medicine). This may be given by mouth (orally) or through an IV tube. Taking care of yourself at home can help your symptoms get better. Your doctor may suggest:  Taking over-the-counter medicines.  Drinking plenty of fluids. The flu often goes away on its own. If you have very bad symptoms or other problems, you may be treated in a hospital. Follow these instructions at home:     Activity  Rest as needed. Get plenty of sleep.  Stay home from work or school as told by your doctor. ? Do not leave home until you do not have a fever for 24 hours without taking medicine. ? Leave home only to visit your doctor. Eating and drinking  Take an ORS (oral rehydration solution). This is a drink that is sold at pharmacies and stores.  Drink enough fluid to keep your pee (urine)  pale yellow.  Drink clear fluids in small amounts as you are able. Clear fluids include: ? Water. ? Ice chips. ? Fruit juice that has water added (diluted fruit juice). ? Low-calorie sports drinks.  Eat bland, easy-to-digest foods in small amounts as you are able. These foods include: ? Bananas. ? Applesauce. ? Rice. ? Lean meats. ? Toast. ? Crackers.  Do not eat or drink: ? Fluids that have a lot of sugar or caffeine. ? Alcohol. ? Spicy or fatty foods. General instructions  Take over-the-counter and prescription medicines only as told by your  doctor.  Use a cool mist humidifier to add moisture to the air in your home. This can make it easier for you to breathe.  Cover your mouth and nose when you cough or sneeze.  Wash your hands with soap and water often, especially after you cough or sneeze. If you cannot use soap and water, use alcohol-based hand sanitizer.  Keep all follow-up visits as told by your doctor. This is important. How is this prevented?   Get a flu shot every year. You may get the flu shot in late summer, fall, or winter. Ask your doctor when you should get your flu shot.  Avoid contact with people who are sick during fall and winter (cold and flu season). Contact a doctor if:  You get new symptoms.  You have: ? Chest pain. ? Watery poop (diarrhea). ? A fever.  Your cough gets worse.  You start to have more mucus.  You feel sick to your stomach.  You throw up. Get help right away if you:  Have shortness of breath.  Have trouble breathing.  Have skin or nails that turn a bluish color.  Have very bad pain or stiffness in your neck.  Get a sudden headache.  Get sudden pain in your face or ear.  Cannot eat or drink without throwing up. Summary  Influenza ("the flu") is an infection in the lungs, nose, and throat. It is caused by a virus.  Take over-the-counter and prescription medicines only as told by your doctor.  Getting a flu shot every year is the best way to avoid getting the flu. This information is not intended to replace advice given to you by your health care provider. Make sure you discuss any questions you have with your health care provider. Document Released: 02/08/2008 Document Revised: 10/17/2017 Document Reviewed: 10/17/2017 Elsevier Interactive Patient Education  2019 [employer].

## 2018-08-09 ENCOUNTER — Telehealth: Payer: Self-pay | Admitting: Internal Medicine

## 2018-08-09 ENCOUNTER — Encounter: Payer: Self-pay | Admitting: Internal Medicine

## 2018-08-09 ENCOUNTER — Other Ambulatory Visit: Payer: Self-pay

## 2018-08-09 ENCOUNTER — Ambulatory Visit (INDEPENDENT_AMBULATORY_CARE_PROVIDER_SITE_OTHER): Payer: BLUE CROSS/BLUE SHIELD | Admitting: Internal Medicine

## 2018-08-09 DIAGNOSIS — I10 Essential (primary) hypertension: Secondary | ICD-10-CM | POA: Diagnosis not present

## 2018-08-09 DIAGNOSIS — Z8709 Personal history of other diseases of the respiratory system: Secondary | ICD-10-CM

## 2018-08-09 DIAGNOSIS — R058 Other specified cough: Secondary | ICD-10-CM

## 2018-08-09 DIAGNOSIS — Z79899 Other long term (current) drug therapy: Secondary | ICD-10-CM | POA: Diagnosis not present

## 2018-08-09 DIAGNOSIS — R05 Cough: Secondary | ICD-10-CM

## 2018-08-09 MED ORDER — BUDESONIDE-FORMOTEROL FUMARATE 160-4.5 MCG/ACT IN AERO: 2.0000 | INHALATION_SPRAY | Freq: Two times a day (BID) | RESPIRATORY_TRACT | 3 refills | Status: DC

## 2018-08-09 MED ORDER — PREDNISONE 50 MG PO TABS: 50.0000 mg | ORAL_TABLET | Freq: Every day | ORAL | 0 refills | Status: DC

## 2018-08-09 NOTE — Progress Notes (Signed)
Virtual Visit via Video Note  I connected with@ on 08/09/18 at  1:30 PM EDT by a video enabled telemedicine application and verified that I am speaking with the correct person using two identifiers.  Location patient: home Location provider:work or home office Persons participating in the virtual visit: patient, provider  e limitations of evaluation and management by telemedicine and the lmimited  availability of in person appointments.with resp sx  The patient expressed understanding and agreed to proceed.   HPI:  Monica Estrada Has had about 3 days of increasing cough without shortness of breath fever or systemic symptoms that she feels it is her "bronchitis" again.  She is requesting prednisone treatment because this worked quite well in January when she had the same problem. Negative exposure to COVID-19 or recent travel.  And no systemic systemic symptoms. She has not had acute wheezing but is history of recurrent wheezing.  No hemoptysis or pleuritic chest pain. She does use some albuterol but it is not that helpful. She had the flu a few months ago and recovered. She is on an ACE inhibitor but does not seem to have a cough in between illnesses. She has never been on a controller inhaler.  ROS: See pertinent positives and negatives per HPI.  Past Medical History:  Diagnosis Date  . Abnormal Pap smear   . Colonic edema    mild left- ed eval abd ct  . Colonic edema   . Gave birth to child recently    2012  . History of hypothyroidism 1999   of post partum  . Hx of abnormal cervical Pap smear   . Hypertension   . Uveitis    stable  . Wheezing 04/01/2015    Past Surgical History:  Procedure Laterality Date  . CHOLECYSTECTOMY     1999  . TUBAL LIGATION  12/31/2010   Procedure: BILATERAL TUBAL LIGATION;  Surgeon: Blane Ohara Meisinger;  Location: [facility];  Service: Gynecology;  Laterality: Bilateral;  Bilateral post partum tubal ligation    Family History  Problem  Relation Age of Onset  . Hypertension Mother   . Diabetes Mother   . Thyroid disease Mother   . Diabetes Father   . Hypertension Father   . Lupus Sister   . Rheum arthritis Sister   . Other Sister        lamb disease  . Diabetes Brother   . Diabetes Son   . Stroke Sister        Current Outpatient Medications:  .  albuterol (PROVENTIL HFA;VENTOLIN HFA) 108 (90 Base) MCG/ACT inhaler, Inhale 2 puffs into the lungs every 6 (six) hours as needed for wheezing or shortness of breath., Disp: 1 Inhaler, Rfl: 1 .  amLODipine (NORVASC) 5 MG tablet, TAKE 1 TABLET (5 MG TOTAL) BY MOUTH DAILY., Disp: 90 tablet, Rfl: 3 .  atropine 1 % ophthalmic solution, Place 1 drop into the right eye 2 (two) times daily., Disp: , Rfl:  .  budesonide-formoterol (SYMBICORT) 160-4.5 MCG/ACT inhaler, Inhale 2 puffs into the lungs 2 (two) times daily. For 3 weeks and then as directed for wheezing and cough, Disp: 1 Inhaler, Rfl: 3 .  ferrous sulfate 325 (65 FE) MG tablet, Take 650 mg by mouth daily with breakfast., Disp: , Rfl:  .  guaiFENesin-codeine 100-10 MG/5ML syrup, Take 5 mLs by mouth 3 (three) times daily as needed for cough., Disp: 120 mL, Rfl: 0 .  lisinopril-hydrochlorothiazide (PRINZIDE,ZESTORETIC) 20-25 MG tablet, Take 1 tablet  by mouth daily., Disp: 90 tablet, Rfl: 3 .  oseltamivir (TAMIFLU) 75 MG capsule, Take 1 capsule (75 mg total) by mouth 2 (two) times daily., Disp: 10 capsule, Rfl: 0 .  predniSONE (DELTASONE) 50 MG tablet, Take 1 tablet (50 mg total) by mouth daily with breakfast., Disp: 3 tablet, Rfl: 0  EXAM:  VITALS per patient if applicable: Looks well no coughing during exam the whole of visit does not look like she has serious congestion upper.  GENERAL: alert, oriented, appears well and in no acute distress  HEENT: atraumatic, conjunttiva clear, no obvious abnormalities on inspection of external nose and ears  NECK: normal movements of the head and neck  LUNGS: on inspection no signs  of respiratory distress, breathing rate appears normal, no obvious gross SOB, gasping or wheezing  CV: no obvious cyanosis  PSYCH/NEURO: pleasant and cooperative, no obvious depression or anxiety, speech and thought processing grossly intact  ASSESSMENT AND PLAN:  Discussed the following assessment and plan:  Recurrent cough  History of acute bronchitis with bronchospasm  Medication management  Essential hypertension  Recurrent cough low risk for coverage other infection.  Would prefer to have seen her in the office but because of COVID-19 pandemic it is reasonable to treat her for her recurrent bronchospasm on review of her chart she has had recurrent bronchitis with bronchospasm usually related to a viral illness but not always. She looks rather well online. We will send in 3 days of prednisone and Symbicort to transition to use for another few weeks and then as needed.  If she is having recurrent coughing consideration of changing her blood pressure medicine or maintain on a controller inhaler.  This sounds like cough variant asthma.   I discussed the assessment and treatment plan with the patient. The patient was provided an opportunity to ask questions and all were answered. The patient agreed with the plan and demonstrated an understanding of the instructions.  revewied alarm sx and when to seek care ed etc or  Contact on call  The patient was advised to call back or seek an in-person evaluation if the symptoms worsen or if the condition fails to improve as anticipated.  I provided minutes of 18  non-face-to-face  But video face time during this encounter.   Shanon Ace, MD

## 2018-08-09 NOTE — Telephone Encounter (Signed)
Pt is scheduled for webex today at 1:30

## 2018-08-09 NOTE — Telephone Encounter (Signed)
Copied from [place] [id number]. Topic: General - Other >> Aug 09, 2018  8:45 AM Keene Breath wrote: Reason for CRM: Patient called to request a script for steroids pills for her bronchitis.  Patient stated that she was given some pills before when she had a flare up.  Please advise and have the nurse or doctor call the patient at [phone]

## 2018-08-09 NOTE — Telephone Encounter (Signed)
Pt has been scheduled for webex.  

## 2018-08-09 NOTE — Telephone Encounter (Signed)
Copied from [place] [id number]. Topic: Quick Communication - Rx Refill/Question >> Aug 09, 2018  8:42 AM Keene Breath wrote: Medication:  guaiFENesin-codeine 100-10 MG/5ML syrup  Patient called to request a refill for the above medication  Preferred Pharmacy (with phone number or street name): [id number] - [place], [place] [phone] (Phone) [phone] (Fax)

## 2018-10-05 ENCOUNTER — Other Ambulatory Visit: Payer: Self-pay | Admitting: Internal Medicine

## 2018-10-10 ENCOUNTER — Other Ambulatory Visit: Payer: Self-pay | Admitting: *Deleted

## 2018-11-01 DIAGNOSIS — H35351 Cystoid macular degeneration, right eye: Secondary | ICD-10-CM | POA: Diagnosis not present

## 2018-11-01 DIAGNOSIS — H2000 Unspecified acute and subacute iridocyclitis: Secondary | ICD-10-CM | POA: Diagnosis not present

## 2018-11-01 DIAGNOSIS — H21541 Posterior synechiae (iris), right eye: Secondary | ICD-10-CM | POA: Diagnosis not present

## 2018-11-01 DIAGNOSIS — H2513 Age-related nuclear cataract, bilateral: Secondary | ICD-10-CM | POA: Diagnosis not present

## 2018-11-01 DIAGNOSIS — H4020X0 Unspecified primary angle-closure glaucoma, stage unspecified: Secondary | ICD-10-CM | POA: Diagnosis not present

## 2018-11-01 DIAGNOSIS — Z1159 Encounter for screening for other viral diseases: Secondary | ICD-10-CM | POA: Diagnosis not present

## 2018-11-19 DIAGNOSIS — H21541 Posterior synechiae (iris), right eye: Secondary | ICD-10-CM | POA: Diagnosis not present

## 2018-11-19 DIAGNOSIS — H2000 Unspecified acute and subacute iridocyclitis: Secondary | ICD-10-CM | POA: Diagnosis not present

## 2018-11-19 DIAGNOSIS — H2513 Age-related nuclear cataract, bilateral: Secondary | ICD-10-CM | POA: Diagnosis not present

## 2018-11-19 DIAGNOSIS — H35351 Cystoid macular degeneration, right eye: Secondary | ICD-10-CM | POA: Diagnosis not present

## 2018-12-07 ENCOUNTER — Other Ambulatory Visit: Payer: Self-pay

## 2018-12-07 ENCOUNTER — Emergency Department (HOSPITAL_COMMUNITY)
Admission: EM | Admit: 2018-12-07 | Discharge: 2018-12-07 | Disposition: A | Discharge status: Home / Self Care | Payer: BLUE CROSS/BLUE SHIELD | Attending: Emergency Medicine | Admitting: Emergency Medicine

## 2018-12-07 ENCOUNTER — Encounter (HOSPITAL_COMMUNITY): Payer: Self-pay | Admitting: Emergency Medicine

## 2018-12-07 DIAGNOSIS — S3992XA Unspecified injury of lower back, initial encounter: Secondary | ICD-10-CM | POA: Diagnosis present

## 2018-12-07 DIAGNOSIS — Y999 Unspecified external cause status: Secondary | ICD-10-CM | POA: Insufficient documentation

## 2018-12-07 DIAGNOSIS — I10 Essential (primary) hypertension: Secondary | ICD-10-CM | POA: Insufficient documentation

## 2018-12-07 DIAGNOSIS — Y9389 Activity, other specified: Secondary | ICD-10-CM | POA: Insufficient documentation

## 2018-12-07 DIAGNOSIS — M25562 Pain in left knee: Secondary | ICD-10-CM | POA: Insufficient documentation

## 2018-12-07 DIAGNOSIS — Z79899 Other long term (current) drug therapy: Secondary | ICD-10-CM | POA: Insufficient documentation

## 2018-12-07 DIAGNOSIS — Y92410 Unspecified street and highway as the place of occurrence of the external cause: Secondary | ICD-10-CM | POA: Insufficient documentation

## 2018-12-07 DIAGNOSIS — E039 Hypothyroidism, unspecified: Secondary | ICD-10-CM | POA: Diagnosis not present

## 2018-12-07 DIAGNOSIS — S39012A Strain of muscle, fascia and tendon of lower back, initial encounter: Secondary | ICD-10-CM

## 2018-12-07 DIAGNOSIS — M25561 Pain in right knee: Secondary | ICD-10-CM | POA: Diagnosis not present

## 2018-12-07 MED ORDER — METHOCARBAMOL 500 MG PO TABS: 1000.0000 mg | ORAL_TABLET | Freq: Three times a day (TID) | ORAL | 0 refills | Status: DC | PRN
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
## 2018-12-11 ENCOUNTER — Telehealth: Payer: Self-pay | Admitting: Internal Medicine
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
## 2018-12-13 NOTE — Telephone Encounter (Signed)
lvm for pt to call back okay to schedule for in office as long as no symptoms of covid

## 2018-12-14 ENCOUNTER — Other Ambulatory Visit: Payer: Self-pay

## 2018-12-14 ENCOUNTER — Ambulatory Visit
Admission: EM | Admit: 2018-12-14 | Discharge: 2018-12-14 | Disposition: A | Discharge status: Home / Self Care | Payer: BLUE CROSS/BLUE SHIELD | Attending: Emergency Medicine | Admitting: Emergency Medicine

## 2018-12-14 DIAGNOSIS — M25562 Pain in left knee: Secondary | ICD-10-CM

## 2018-12-14 DIAGNOSIS — S39012D Strain of muscle, fascia and tendon of lower back, subsequent encounter: Secondary | ICD-10-CM

## 2018-12-14 DIAGNOSIS — M25561 Pain in right knee: Secondary | ICD-10-CM | POA: Diagnosis not present

## 2018-12-14 MED ORDER — NAPROXEN 500 MG PO TABS: 500.0000 mg | ORAL_TABLET | Freq: Two times a day (BID) | ORAL | 0 refills | Status: DC
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
## 2018-12-14 NOTE — Discharge Instructions (Addendum)
Muscle relaxer only when needed, preferred nighttime dosing. May take naproxen up to twice daily in conjunction with Tylenol as needed for pain: Important to avoid BC powders, ibuprofen, Motrin, Advil taken naproxen.
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
## 2019-01-09 ENCOUNTER — Other Ambulatory Visit: Payer: Self-pay | Admitting: Internal Medicine

## 2019-02-06 ENCOUNTER — Other Ambulatory Visit: Payer: Self-pay | Admitting: Internal Medicine

## 2019-02-06 DIAGNOSIS — Z1231 Encounter for screening mammogram for malignant neoplasm of breast: Secondary | ICD-10-CM

## 2019-03-26 ENCOUNTER — Ambulatory Visit
Admission: RE | Admit: 2019-03-26 | Discharge: 2019-03-26 | Disposition: A | Discharge status: Home / Self Care | Payer: BLUE CROSS/BLUE SHIELD | Source: Ambulatory Visit | Attending: Internal Medicine | Admitting: Internal Medicine

## 2019-03-26 ENCOUNTER — Other Ambulatory Visit: Payer: Self-pay

## 2019-03-26 DIAGNOSIS — Z1231 Encounter for screening mammogram for malignant neoplasm of breast: Secondary | ICD-10-CM | POA: Diagnosis not present

## 2019-05-14 DIAGNOSIS — Z1389 Encounter for screening for other disorder: Secondary | ICD-10-CM | POA: Diagnosis not present

## 2019-05-14 DIAGNOSIS — Z01419 Encounter for gynecological examination (general) (routine) without abnormal findings: Secondary | ICD-10-CM | POA: Diagnosis not present

## 2019-05-14 DIAGNOSIS — Z130 Encounter for screening for diseases of the blood and blood-forming organs and certain disorders involving the immune mechanism: Secondary | ICD-10-CM | POA: Diagnosis not present

## 2019-05-14 DIAGNOSIS — Z6837 Body mass index (BMI) 37.0-37.9, adult: Secondary | ICD-10-CM | POA: Diagnosis not present

## 2019-05-14 LAB — CBC AND DIFFERENTIAL: Hemoglobin: 14.7 (ref 12.0–16.0)

## 2019-05-22 ENCOUNTER — Encounter: Payer: Self-pay | Admitting: Internal Medicine

## 2019-06-26 ENCOUNTER — Ambulatory Visit: Payer: BC Managed Care – PPO | Attending: Internal Medicine

## 2019-06-26 DIAGNOSIS — Z20822 Contact with and (suspected) exposure to covid-19: Secondary | ICD-10-CM | POA: Insufficient documentation

## 2019-06-27 ENCOUNTER — Encounter: Payer: Self-pay | Admitting: Internal Medicine

## 2019-06-27 ENCOUNTER — Telehealth (INDEPENDENT_AMBULATORY_CARE_PROVIDER_SITE_OTHER): Payer: BC Managed Care – PPO | Admitting: Internal Medicine

## 2019-06-27 ENCOUNTER — Other Ambulatory Visit: Payer: Self-pay

## 2019-06-27 DIAGNOSIS — R05 Cough: Secondary | ICD-10-CM

## 2019-06-27 DIAGNOSIS — Z20822 Contact with and (suspected) exposure to covid-19: Secondary | ICD-10-CM

## 2019-06-27 DIAGNOSIS — Z8709 Personal history of other diseases of the respiratory system: Secondary | ICD-10-CM | POA: Diagnosis not present

## 2019-06-27 DIAGNOSIS — R058 Other specified cough: Secondary | ICD-10-CM

## 2019-06-27 LAB — NOVEL CORONAVIRUS, NAA: SARS-CoV-2, NAA: NOT DETECTED

## 2019-06-27 MED ORDER — BENZONATATE 100 MG PO CAPS
100.0000 mg | ORAL_CAPSULE | Freq: Three times a day (TID) | ORAL | 0 refills | Status: DC | PRN
Start: 2019-06-27 — End: 2020-04-01

## 2019-06-27 MED ORDER — HYDROCODONE-HOMATROPINE 5-1.5 MG/5ML PO SYRP: 5.0000 mL | ORAL_SOLUTION | Freq: Three times a day (TID) | ORAL | 0 refills | Status: DC | PRN

## 2019-06-27 NOTE — Progress Notes (Signed)
Virtual Visit via Video Note  I connected with@ on 06/27/19 at  2:30 PM EST by a video enabled telemedicine application and verified that I am speaking with the correct person using two identifiers. Location patient: home Location provider:work  office Persons participating in the virtual visit: patient, provider  [place]th national recommendations  regarding COVID 19 pandemic   video visit is advised over in office visit for this patient.  Patient aware  of the limitations of evaluation and management by telemedicine and  availability of in person appointments. and agreed to proceed.   HPI: Monica Estrada presents for video visit for cough around 10 days  Mild and now some worse   No fever but has had myalgias and ha  Some  Distortion of taste  Feels back cough hard to sleep at night  And inhaler albuterol or budesonide not helping . Feels wheezy in chest at times and fits of cough .  HH of 3 other not sick  Out of work today   Got covid testing at [employer] today .   ROS: See pertinent positives and negatives per HPI. No sob x with fits of cough  ? Maybe some chills  No chest painx from coughing   Past Medical History:  Diagnosis Date  . Abnormal Pap smear   . Colonic edema    mild left- ed eval abd ct  . Colonic edema   . Gave birth to child recently    2012  . History of hypothyroidism 1999   of post partum  . Hx of abnormal cervical Pap smear   . Hypertension   . Uveitis    stable  . Wheezing 04/01/2015    Past Surgical History:  Procedure Laterality Date  . CHOLECYSTECTOMY     1999  . TUBAL LIGATION  12/31/2010   Procedure: BILATERAL TUBAL LIGATION;  Surgeon: Blane Ohara Meisinger;  Location: [facility];  Service: Gynecology;  Laterality: Bilateral;  Bilateral post partum tubal ligation    Family History  Problem Relation Age of Onset  . Hypertension Mother   . Diabetes Mother   . Thyroid disease Mother   . Diabetes Father   . Hypertension Father   . Lupus Sister   .  Rheum arthritis Sister   . Other Sister        lamb disease  . Diabetes Brother   . Diabetes Son   . Stroke Sister     Social History   Tobacco Use  . Smoking status: Never Smoker  . Smokeless tobacco: Never Used  Substance Use Topics  . Alcohol use: No  . Drug use: No      Current Outpatient Medications:  .  albuterol (PROVENTIL HFA;VENTOLIN HFA) 108 (90 Base) MCG/ACT inhaler, Inhale 2 puffs into the lungs every 6 (six) hours as needed for wheezing or shortness of breath., Disp: 1 Inhaler, Rfl: 1 .  amLODipine (NORVASC) 5 MG tablet, TAKE 1 TABLET BY MOUTH EVERY DAY, Disp: 90 tablet, Rfl: 3 .  atropine 1 % ophthalmic solution, Place 1 drop into the right eye 2 (two) times daily., Disp: , Rfl:  .  benzonatate (TESSALON) 100 MG capsule, Take 1 capsule (100 mg total) by mouth 3 (three) times daily as needed for cough., Disp: 24 capsule, Rfl: 0 .  budesonide-formoterol (SYMBICORT) 160-4.5 MCG/ACT inhaler, Inhale 2 puffs into the lungs 2 (two) times daily. For 3 weeks and then as directed for wheezing and cough, Disp: 1 Inhaler, Rfl: 3 .  ferrous sulfate 325 (65 FE) MG tablet, Take 650 mg by mouth daily with breakfast., Disp: , Rfl:  .  guaiFENesin-codeine 100-10 MG/5ML syrup, Take 5 mLs by mouth 3 (three) times daily as needed for cough., Disp: 120 mL, Rfl: 0 .  HYDROcodone-homatropine (HYCODAN) 5-1.5 MG/5ML syrup, Take 5 mLs by mouth every 8 (eight) hours as needed for cough (night)., Disp: 120 mL, Rfl: 0 .  lisinopril-hydrochlorothiazide (ZESTORETIC) 20-25 MG tablet, TAKE 1 TABLET BY MOUTH EVERY DAY, Disp: 90 tablet, Rfl: 3 .  methocarbamol (ROBAXIN) 500 MG tablet, Take 2 tablets (1,000 mg total) by mouth every 8 (eight) hours as needed for muscle spasms., Disp: 20 tablet, Rfl: 0 .  naproxen (NAPROSYN) 500 MG tablet, Take 1 tablet (500 mg total) by mouth 2 (two) times daily., Disp: 30 tablet, Rfl: 0 .  oseltamivir (TAMIFLU) 75 MG capsule, Take 1 capsule (75 mg total) by mouth 2 (two)  times daily., Disp: 10 capsule, Rfl: 0 .  predniSONE (DELTASONE) 50 MG tablet, Take 1 tablet (50 mg total) by mouth daily with breakfast., Disp: 3 tablet, Rfl: 0  EXAM: BP Readings from Last 3 Encounters:  12/14/18 132/87  12/07/18 (!) 142/106  06/29/18 (!) 150/96    VITALS per patient if applicable:  GENERAL: alert, oriented, appears well and in no acute distress intermittent  Bronchial cough goog color non toxic and no sob   HEENT: atraumatic, conjunttiva clear, no obvious abnormalities on inspection of external nose and ears  NECK: normal movements of the head and neck  LUNGS: on inspection no signs of respiratory distress, breathing rate appears normal, no obvious gross SOB, gasping or wheezing  CV: no obvious cyanosis  PSYCH/NEURO: pleasant and cooperative, no obvious depression or anxiety, speech and thought processing grossly intact Lab Results  Component Value Date   WBC 5.0 09/24/2017   HGB 14.7 05/14/2019   HCT 39.5 09/24/2017   PLT 247.0 09/24/2017   GLUCOSE 70 09/24/2017   CHOL 199 09/24/2017   TRIG 116.0 09/24/2017   HDL 50.50 09/24/2017   LDLDIRECT 144.5 10/31/2010   LDLCALC 126 (H) 09/24/2017   ALT 13 09/24/2017   AST 14 09/24/2017   NA 141 09/24/2017   K 3.8 09/24/2017   CL 104 09/24/2017   CREATININE 0.75 09/24/2017   BUN 10 09/24/2017   CO2 30 09/24/2017   TSH 4.10 09/24/2017   HGBA1C 4.7 09/24/2017   Last year had cough episode  hc of ocass wheezing  ASSESSMENT AND PLAN:  Discussed the following assessment and plan:    ICD-10-CM   1. Respiratory tract congestion with cough  R05   2. History of acute bronchitis with bronchospasm  Z87.09   3. COVID-19 virus test result unknown pending   Z20.822    cough acute resp infection  Presumed/  consider  sars cov2  pcr test pending    Counseled.  Supportive comfort  rx at this time alarm sx reviewed   Expectant management and discussion of plan and treatment with opportunity to ask questions and all  were answered. The patient agreed with the plan and demonstrated an understanding of the instructions.   Advised to call back or seek an in-person evaluation if worsening  or having  further concerns . Return for touch base after weekend and  when need work note  or as needed . Outside external source  DATA REVIEWED: past eval x ray other providers   Total time on date  of service including record review ordering and  plan of care: 30 minutes    Let us know test results and  How doing next week  We can get her a work excuse  letter for absence  .  Shanon Ace, MD

## 2019-06-30 ENCOUNTER — Ambulatory Visit (INDEPENDENT_AMBULATORY_CARE_PROVIDER_SITE_OTHER): Payer: BC Managed Care – PPO

## 2019-06-30 ENCOUNTER — Telehealth: Payer: Self-pay | Admitting: Internal Medicine

## 2019-06-30 ENCOUNTER — Other Ambulatory Visit: Payer: Self-pay

## 2019-06-30 ENCOUNTER — Ambulatory Visit (INDEPENDENT_AMBULATORY_CARE_PROVIDER_SITE_OTHER): Payer: BC Managed Care – PPO | Admitting: Family Medicine

## 2019-06-30 VITALS — BP 138/78 | HR 104 | Temp 98.4°F | Wt 212.1 lb

## 2019-06-30 DIAGNOSIS — J40 Bronchitis, not specified as acute or chronic: Secondary | ICD-10-CM | POA: Diagnosis not present

## 2019-06-30 DIAGNOSIS — R0602 Shortness of breath: Secondary | ICD-10-CM

## 2019-06-30 DIAGNOSIS — R059 Cough, unspecified: Secondary | ICD-10-CM

## 2019-06-30 DIAGNOSIS — R05 Cough: Secondary | ICD-10-CM | POA: Diagnosis not present

## 2019-06-30 MED ORDER — ALBUTEROL SULFATE HFA 108 (90 BASE) MCG/ACT IN AERS: 2.0000 | INHALATION_SPRAY | RESPIRATORY_TRACT | 1 refills | Status: DC | PRN

## 2019-06-30 MED ORDER — PREDNISONE 20 MG PO TABS: 40.0000 mg | ORAL_TABLET | Freq: Every day | ORAL | 0 refills | Status: AC

## 2019-06-30 MED ORDER — METHYLPREDNISOLONE SODIUM SUCC 125 MG IJ SOLR: 125.0000 mg | Freq: Once | INTRAMUSCULAR | Status: DC

## 2019-06-30 MED ORDER — GUAIFENESIN-CODEINE 100-10 MG/5ML PO SOLN: 5.0000 mL | Freq: Four times a day (QID) | ORAL | 0 refills | Status: DC | PRN

## 2019-06-30 MED ORDER — FLOVENT HFA 110 MCG/ACT IN AERO: 2.0000 | INHALATION_SPRAY | Freq: Two times a day (BID) | RESPIRATORY_TRACT | 12 refills | Status: DC | PRN

## 2019-06-30 MED ORDER — CEFDINIR 300 MG PO CAPS: 300.0000 mg | ORAL_CAPSULE | Freq: Two times a day (BID) | ORAL | 0 refills | Status: DC

## 2019-06-30 NOTE — Telephone Encounter (Signed)
Pt is calling back with an update from last visit and would like a call back.   Patient [phone]

## 2019-06-30 NOTE — Telephone Encounter (Signed)
Spoke with the pt and informed her of the message below.  Appt scheduled and the pt agreed to arrive at 6pm for check in.

## 2019-06-30 NOTE — Patient Instructions (Signed)
Take all medications as prescribed.  If symptoms worsens or breathing becomes difficulty, go immediately to the emergency department.  Use inhalers consistently as prescribed.  I will notify you via My Chart of your chest x-ray results.  You are being treated for acute bronchitis.  I will see you back here in one week for evaluation of symptoms and appropriateness to return to work.    Acute Bronchitis, Adult  Acute bronchitis is when air tubes in the lungs (bronchi) suddenly get swollen. The condition can make it hard for you to breathe. In adults, acute bronchitis usually goes away within 2 weeks. A cough caused by bronchitis may last up to 3 weeks. Smoking, allergies, and asthma can make the condition worse. What are the causes? This condition is caused by:  Cold and flu viruses. The most common cause of this condition is the virus that causes the common cold.  Bacteria.  Substances that irritate the lungs, including: ? Smoke from cigarettes and other types of tobacco. ? Dust and pollen. ? Fumes from chemicals, gases, or burned fuel. ? Other materials that pollute indoor or outdoor air.  Close contact with someone who has acute bronchitis. What increases the risk? The following factors may make you more likely to develop this condition:  A weak body's defense system. This is also called the immune system.  Any condition that affects your lungs and breathing, such as asthma. What are the signs or symptoms? Symptoms of this condition include:  A cough.  Coughing up clear, yellow, or green mucus.  Wheezing.  Chest congestion.  Shortness of breath.  A fever.  Body aches.  Chills.  A sore throat. How is this treated? Acute bronchitis may go away over time without treatment. Your doctor may recommend:  Drinking more fluids.  Taking a medicine for a fever or cough.  Using a device that gets medicine into your lungs (inhaler).  Using a vaporizer or a  humidifier. These are machines that add water or moisture in the air to help with coughing and poor breathing. Follow these instructions at home:  Activity  Get a lot of rest.  Avoid places where there are fumes from chemicals.  Return to your normal activities as told by your doctor. Ask your doctor what activities are safe for you. Lifestyle  Drink enough fluids to keep your pee (urine) pale yellow.  Do not drink alcohol.  Do not use any products that contain nicotine or tobacco, such as cigarettes, e-cigarettes, and chewing tobacco. If you need help quitting, ask your doctor. Be aware that: ? Your bronchitis will get worse if you smoke or breathe in other people's smoke (secondhand smoke). ? Your lungs will heal faster if you quit smoking. General instructions  Take over-the-counter and prescription medicines only as told by your doctor.  Use an inhaler, cool mist vaporizer, or humidifier as told by your doctor.  Rinse your mouth often with salt water. To make salt water, dissolve -1 tsp (3-6 g) of salt in 1 cup (237 mL) of warm water.  Keep all follow-up visits as told by your doctor. This is important. How is this prevented? To lower your risk of getting this condition again:  Wash your hands often with soap and water. If soap and water are not available, use hand sanitizer.  Avoid contact with people who have cold symptoms.  Try not to touch your mouth, nose, or eyes with your hands.  Make sure to get the flu shot every  year. Contact a doctor if:  Your symptoms do not get better in 2 weeks.  You vomit more than once or twice.  You have symptoms of loss of fluid from your body (dehydration). These include: ? Dark urine. ? Dry skin or eyes. ? Increased thirst. ? Headaches. ? Confusion. ? Muscle cramps. Get help right away if:  You cough up blood.  You have chest pain.  You have very bad shortness of breath.  You become dehydrated.  You faint or keep  feeling like you are going to faint.  You keep vomiting.  You have a very bad headache.  Your fever or chills get worse. These symptoms may be an emergency. Do not wait to see if the symptoms will go away. Get medical help right away. Call your local emergency services (911 in the [place] Do not drive yourself to the hospital. Summary  Acute bronchitis is when air tubes in the lungs (bronchi) suddenly get swollen. In adults, acute bronchitis usually goes away within 2 weeks.  Take over-the-counter and prescription medicines only as told by your doctor.  Drink enough fluid to keep your pee (urine) pale yellow.  Contact a doctor if your symptoms do not improve after 2 weeks of treatment.  Get help right away if you cough up blood, faint, or have chest pain or shortness of breath. This information is not intended to replace advice given to you by your health care provider. Make sure you discuss any questions you have with your health care provider. Document Revised: 11/22/2018 Document Reviewed: 11/22/2018 Elsevier Patient Education  [employer].

## 2019-06-30 NOTE — Telephone Encounter (Signed)
Can we set her up for the  In person respiratory clinic this  Evening ? So she can have  A good chest exam and further evaluation.

## 2019-06-30 NOTE — Telephone Encounter (Signed)
I called the pt for additional information.  Patient stated the COVID test was negative.  Stated she feels the same, still has a cough with clear "foamy" sputum and chest tightness, cannot sleep due to noise in her chest.  States neither cough medication seems to help.  Denies a fever and stated she has noticed chills and temperature was normal.  Message sent to PCP.

## 2019-07-01 DIAGNOSIS — R05 Cough: Secondary | ICD-10-CM | POA: Diagnosis not present

## 2019-07-01 DIAGNOSIS — R0602 Shortness of breath: Secondary | ICD-10-CM | POA: Diagnosis not present

## 2019-07-01 LAB — CBC WITH DIFFERENTIAL/PLATELET
Basophils Absolute: 0.1 10*3/uL (ref 0.0–0.2)
Basos: 1 %
EOS (ABSOLUTE): 0.6 10*3/uL — ABNORMAL HIGH (ref 0.0–0.4)
Eos: 11 %
Hematocrit: 42.1 % (ref 34.0–46.6)
Hemoglobin: 13.4 g/dL (ref 11.1–15.9)
Immature Grans (Abs): 0 10*3/uL (ref 0.0–0.1)
Immature Granulocytes: 1 %
Lymphocytes Absolute: 1.6 10*3/uL (ref 0.7–3.1)
Lymphs: 30 %
MCH: 25.6 pg — ABNORMAL LOW (ref 26.6–33.0)
MCHC: 31.8 g/dL (ref 31.5–35.7)
MCV: 81 fL (ref 79–97)
Monocytes Absolute: 0.5 10*3/uL (ref 0.1–0.9)
Monocytes: 9 %
Neutrophils Absolute: 2.5 10*3/uL (ref 1.4–7.0)
Neutrophils: 48 %
Platelets: 181 10*3/uL (ref 150–450)
RBC: 5.23 x10E6/uL (ref 3.77–5.28)
RDW: 12.3 % (ref 11.7–15.4)
WBC: 5.3 10*3/uL (ref 3.4–10.8)

## 2019-07-02 NOTE — Progress Notes (Addendum)
Patient ID: Monica Estrada, female    DOB: [birth year], 54 y.o.   MRN: [id number]  PCP: Burnis Medin, MD  Chief Complaint  Patient presents with  . Shortness of Breath    with cough   . Chest Pain    EKG DONE     Subjective:  HPI  Monica Estrada is a 54 y.o. female presents to [facility] for evaluation of symptoms related to shortness of breath, persistent cough, and wheezing.  COVID-19 test negative on 06/26/19. Symptoms started more than 1 week ago and have progressively worsened. Current symptoms include wheezing, significant SOB, persistent coughing, and chest tightness. Similar illness occurred 12 months ago and she was placed on antibiotic, prednisone, and two inhalers-albuterol and Symbicort. She is currently not using any inhalers at present. Primary care prescribed Benzonatate and Hycodan for cough which has been ineffective management of cough.  Referred to clinic by PCP who recommends CXR. Current oxygen level 92% at rest.    Review of Systems Pertinent negatives listed in HPI  Patient Active Problem List   Diagnosis Date Noted  . Wheezing 04/01/2015  . Essential hypertension 04/17/2014  . TSH elevation 04/17/2014  . Medication management 04/17/2014  . History of hypokalemia 11/26/2012  . Tingling 11/26/2012  . BMI 32.0-32.9,adult 01/08/2012  . Umbilical hernia  possible from lap site  12/05/2011  . Preventative health care 11/12/2010  . Uveitis   . History of hypothyroidism   . NONSPECIFIC ABN FINDING RAD & OTH EXAM GI TRACT 11/25/2009  . Anemia 11/03/2009  . AUTOIMMUNE THYROIDITIS 11/20/2008  . UVEITIS 12/12/2006  . Hyperlipemia 12/11/2006      Prior to Admission medications   Medication Sig Start Date End Date Taking? Authorizing Provider  albuterol (VENTOLIN HFA) 108 (90 Base) MCG/ACT inhaler Inhale 2 puffs into the lungs every 4 (four) hours as needed for wheezing or shortness of breath. 06/30/19  Yes Scot Jun, FNP   amLODipine (NORVASC) 5 MG tablet TAKE 1 TABLET BY MOUTH EVERY DAY 10/08/18  Yes Panosh, Standley Brooking, MD  atropine 1 % ophthalmic solution Place 1 drop into the right eye 2 (two) times daily.   Yes [provider]  benzonatate (TESSALON) 100 MG capsule Take 1 capsule (100 mg total) by mouth 3 (three) times daily as needed for cough. 06/27/19  Yes Panosh, Standley Brooking, MD  ferrous sulfate 325 (65 FE) MG tablet Take 650 mg by mouth daily with breakfast.   Yes [provider]  guaiFENesin-codeine 100-10 MG/5ML syrup Take 5 mLs by mouth every 6 (six) hours as needed for cough. 06/30/19  Yes Scot Jun, FNP  HYDROcodone-homatropine [facility]) 5-1.5 MG/5ML syrup Take 5 mLs by mouth every 8 (eight) hours as needed for cough (night). 06/27/19  Yes Panosh, Standley Brooking, MD  lisinopril-hydrochlorothiazide (ZESTORETIC) 20-25 MG tablet TAKE 1 TABLET BY MOUTH EVERY DAY 01/09/19  Yes Panosh, Standley Brooking, MD  methocarbamol (ROBAXIN) 500 MG tablet Take 2 tablets (1,000 mg total) by mouth every 8 (eight) hours as needed for muscle spasms. 12/07/18  Yes Julianne Rice, MD  naproxen (NAPROSYN) 500 MG tablet Take 1 tablet (500 mg total) by mouth 2 (two) times daily. 12/14/18  Yes Hall-Potvin, [place], PA-C  cefdinir (OMNICEF) 300 MG capsule Take 1 capsule (300 mg total) by mouth 2 (two) times daily. 06/30/19   Scot Jun, FNP  fluticasone (FLOVENT HFA) 110 MCG/ACT inhaler Inhale 2 puffs into the lungs 2 (two) times daily as needed. 06/30/19  Scot Jun, FNP  oseltamivir (TAMIFLU) 75 MG capsule Take 1 capsule (75 mg total) by mouth 2 (two) times daily. Patient not taking: Reported on 06/30/2019 06/29/18   Jodelle Green, FNP  predniSONE (DELTASONE) 20 MG tablet Take 2 tablets (40 mg total) by mouth daily with breakfast for 5 days. 06/30/19 07/05/19  Scot Jun, FNP    Past Medical, Surgical Family and Social History reviewed and updated.    Objective:   Today's Vitals   06/30/19 1810  BP:  138/78  Pulse: (!) 104  Temp: 98.4 F (36.9 C)  SpO2: 92%  Weight: 212 lb 1.3 oz (96.2 kg)    Wt Readings from Last 3 Encounters:  06/30/19 212 lb 1.3 oz (96.2 kg)  06/29/18 202 lb 12 oz (92 kg)  06/15/18 207 lb (93.9 kg)     Physical Exam Constitutional: Patient appears well-developed and well-nourished. No distress. HENT: Normocephalic, atraumatic, External right and left ear normal. Nasal congestion present  Oropharynx is clear and moist.  Eyes: Conjunctivae and EOM are normal. PERRLA, no scleral icterus. Neck: Normal ROM. Neck supple. No JVD. No tracheal deviation. No thyromegaly. CVS: Tachycardia, regular rhythm,  no murmurs, no gallops, no carotid bruit.  Pulmonary: Rhonchi present, wheezing present, accessory muscle use present   Abdominal: Soft. BS +, no distension, tenderness, rebound or guarding.  Musculoskeletal: Normal range of motion. No edema and no tenderness.  Neuro: Alert. Normal reflexes, muscle tone coordination. No cranial nerve deficit. Skin: Skin is warm and dry. No rash noted. Not diaphoretic. No erythema. No pallor. Psychiatric: Normal mood and affect. Behavior, judgment, thought content normal.       Assessment & Plan:  1. Shortness of breath,  - CBC with Differential/Platelet, pending  - DG Chest Portable 2 Views  DG Chest Portable 2 Views Result Date: 07/01/2019 CLINICAL DATA:  Cough and shortness of breath for 1 week EXAM: CHEST  2 VIEW PORTABLE COMPARISON:  05/18/2017 FINDINGS: There is no edema, consolidation, effusion, or pneumothorax. Scoliosis which is moderate. Unremarkable cardiomediastinal silhouette. Cholecystectomy clips. IMPRESSION: No evidence of active disease. Electronically Signed   By: Monte Fantasia M.D.   On: 07/01/2019 11:40   - methylPREDNISolone sodium succinate (SOLU-MEDROL) 125 mg/2 mL injection 125 mg , in clinic today -Start prednisone 40 mg daily with breakfast x 5 days  2. Bronchitis -Omnicef 300 mg twice daily x 10  days  -Albuterol 2 puffs every 4-6 hours as needed for shortness of breath or wheezing -Start Flovent 2 puffs twice daily as needed for shortness of breath and wheezing - methylPREDNISolone sodium succinate (SOLU-MEDROL) 125 mg/2 mL injection 125 mg   3. Cough - guaiFENesin-codeine 100-10 MG/5ML syrup; Take 5 mLs by mouth every 6 (six) hours as needed for cough.  Dispense: 140 mL; Refill: 0  4. Atypical Chest Pain -ECG Sinus Tachycardia with occasional PVC, 101 BPM. Likely secondary to persistent coughing related to current illness as symptoms worsens with coughing. Repeat at follow-up. If worsens, recommended follow-up at ER   Meds ordered this encounter  Medications  . methylPREDNISolone sodium succinate (SOLU-MEDROL) 125 mg/2 mL injection 125 mg  . albuterol (VENTOLIN HFA) 108 (90 Base) MCG/ACT inhaler    Sig: Inhale 2 puffs into the lungs every 4 (four) hours as needed for wheezing or shortness of breath.    Dispense:  18 g    Refill:  1  . guaiFENesin-codeine 100-10 MG/5ML syrup    Sig: Take 5 mLs by mouth every 6 (six)  hours as needed for cough.    Dispense:  140 mL    Refill:  0  . cefdinir (OMNICEF) 300 MG capsule    Sig: Take 1 capsule (300 mg total) by mouth 2 (two) times daily.    Dispense:  20 capsule    Refill:  0  . predniSONE (DELTASONE) 20 MG tablet    Sig: Take 2 tablets (40 mg total) by mouth daily with breakfast for 5 days.    Dispense:  10 tablet    Refill:  0  . fluticasone (FLOVENT HFA) 110 MCG/ACT inhaler    Sig: Inhale 2 puffs into the lungs 2 (two) times daily as needed.    Dispense:  1 Inhaler    Refill:  12      COVID home monitoring ordered. Recent negative test, however would recommend re-testing at follow-up visit if symptoms do not resolve or significant improve.  -The patient was given clear instructions to go to ER or return to medical center if symptoms do not improve, worsen or new problems develop. The patient verbalized understanding.    RTC: 1 week for follow-up here at Respiratory clinic     Molli Barrows, [facility], PRN Provider  [facility] [place], [place] [facility] Phone: [phone] Clinic Fax: [phone] Clinic Hours: 5:30 pm -7:30 pm (Monday-Friday)

## 2019-07-07 ENCOUNTER — Ambulatory Visit (INDEPENDENT_AMBULATORY_CARE_PROVIDER_SITE_OTHER): Payer: BC Managed Care – PPO | Admitting: Family Medicine

## 2019-07-07 VITALS — BP 140/104 | HR 101 | Temp 98.5°F | Ht 63.25 in | Wt 214.0 lb

## 2019-07-07 DIAGNOSIS — J4541 Moderate persistent asthma with (acute) exacerbation: Secondary | ICD-10-CM

## 2019-07-07 DIAGNOSIS — R0602 Shortness of breath: Secondary | ICD-10-CM

## 2019-07-07 DIAGNOSIS — J40 Bronchitis, not specified as acute or chronic: Secondary | ICD-10-CM

## 2019-07-07 MED ORDER — CEFDINIR 300 MG PO CAPS: 600.0000 mg | ORAL_CAPSULE | Freq: Every day | ORAL | 0 refills | Status: AC

## 2019-07-07 MED ORDER — METHYLPREDNISOLONE ACETATE 40 MG/ML IJ SUSP
40.0000 mg | Freq: Once | INTRAMUSCULAR | Status: AC
  Administered 2019-07-07: 40 mg via INTRAMUSCULAR

## 2019-07-07 NOTE — Progress Notes (Signed)
Patient ID: Monica Estrada, female    DOB: [birth year], 54 y.o.   MRN: [id number]  PCP: Monica Medin, MD  Chief Complaint  Patient presents with  . Bronchitis    Subjective:  HPI Monica Estrada is a 54 y.o. female presents to [facility] Respiratory clinic for follow-up evaluation of recent visit in which she was treated for an acute bronchitis exacerbation.  Patient last seen on 06/30/2019 here at the respiratory clinic.  Patient presented acutely with shortness of breath and wheezing.  Chest x-ray was negative for pneumonia.  Patient also has recently tested negative for COVID-19.  She was placed on prednisone, a steroid inhaler, and a rescue inhaler along with covered with antibiotics.  Today she reports improvement of symptoms however continues to have wheezing with shortness of breath with exertional activities.  She endorses using her rescue inhaler at least 2-3 times per day.  She noticed significant improvement with the steroids however once the medication was completed she started noticing increased work of breathing and wheezing to return Patient has no formal diagnosis of asthma or COPD. Of note patient's blood pressure is elevated on today and was elevated during her previous visit here at the respiratory clinic.  Patient endorses taking blood pressure medications as prescribed.  She does not monitor her blood pressure at home.  Intends to follow-up with primary care provider once respiratory symptoms improved.  Review of Systems Pertinent negatives listed in HPI  Patient Active Problem List   Diagnosis Date Noted  . Wheezing 04/01/2015  . Essential hypertension 04/17/2014  . TSH elevation 04/17/2014  . Medication management 04/17/2014  . History of hypokalemia 11/26/2012  . Tingling 11/26/2012  . BMI 32.0-32.9,adult 01/08/2012  . Umbilical hernia  possible from lap site  12/05/2011  . Preventative health care 11/12/2010  . Uveitis   . History of hypothyroidism   .  NONSPECIFIC ABN FINDING RAD & OTH EXAM GI TRACT 11/25/2009  . Anemia 11/03/2009  . AUTOIMMUNE THYROIDITIS 11/20/2008  . UVEITIS 12/12/2006  . Hyperlipemia 12/11/2006      Prior to Admission medications   Medication Sig Start Date End Date Taking? Authorizing Provider  albuterol (VENTOLIN HFA) 108 (90 Base) MCG/ACT inhaler Inhale 2 puffs into the lungs every 4 (four) hours as needed for wheezing or shortness of breath. 06/30/19  Yes Scot Jun, FNP  amLODipine (NORVASC) 5 MG tablet TAKE 1 TABLET BY MOUTH EVERY DAY 10/08/18  Yes Panosh, Standley Brooking, MD  atropine 1 % ophthalmic solution Place 1 drop into the right eye 2 (two) times daily.   Yes [provider]  benzonatate (TESSALON) 100 MG capsule Take 1 capsule (100 mg total) by mouth 3 (three) times daily as needed for cough. 06/27/19  Yes Panosh, Standley Brooking, MD  cefdinir (OMNICEF) 300 MG capsule Take 1 capsule (300 mg total) by mouth 2 (two) times daily. 06/30/19  Yes Scot Jun, FNP  ferrous sulfate 325 (65 FE) MG tablet Take 650 mg by mouth daily with breakfast.   Yes [provider]  fluticasone (FLOVENT HFA) 110 MCG/ACT inhaler Inhale 2 puffs into the lungs 2 (two) times daily as needed. 06/30/19  Yes Scot Jun, FNP  guaiFENesin-codeine 100-10 MG/5ML syrup Take 5 mLs by mouth every 6 (six) hours as needed for cough. 06/30/19  Yes Scot Jun, FNP  HYDROcodone-homatropine [facility]) 5-1.5 MG/5ML syrup Take 5 mLs by mouth every 8 (eight) hours as needed for cough (night). 06/27/19  Yes Panosh, Mariann Laster  K, MD  lisinopril-hydrochlorothiazide (ZESTORETIC) 20-25 MG tablet TAKE 1 TABLET BY MOUTH EVERY DAY 01/09/19  Yes Panosh, Standley Brooking, MD  methocarbamol (ROBAXIN) 500 MG tablet Take 2 tablets (1,000 mg total) by mouth every 8 (eight) hours as needed for muscle spasms. 12/07/18  Yes Julianne Rice, MD  naproxen (NAPROSYN) 500 MG tablet Take 1 tablet (500 mg total) by mouth 2 (two) times daily. 12/14/18  Yes  Hall-Potvin, [place], PA-C  oseltamivir (TAMIFLU) 75 MG capsule Take 1 capsule (75 mg total) by mouth 2 (two) times daily. 06/29/18  Yes Guse, Jacquelynn Cree, FNP    Past Medical, Surgical Family and Social History reviewed and updated.    Objective:   Today's Vitals   07/07/19 1759  BP: (!) 140/104  Pulse: (!) 101  Temp: 98.5 F (36.9 C)  TempSrc: Oral  SpO2: 95%  Weight: 214 lb (97.1 kg)  Height: 5' 3.25" (1.607 m)    Wt Readings from Last 3 Encounters:  07/07/19 214 lb (97.1 kg)  06/30/19 212 lb 1.3 oz (96.2 kg)  06/29/18 202 lb 12 oz (92 kg)   Physical Exam Constitutional:      General: She is not in acute distress.    Appearance: She is obese. She is ill-appearing. She is not toxic-appearing.  HENT:     Nose: Nose normal.  Cardiovascular:     Rate and Rhythm: Normal rate and regular rhythm.  Pulmonary:     Effort: Prolonged expiration present.     Breath sounds: Decreased air movement present. Wheezing present. No rhonchi or rales.  Musculoskeletal:     Cervical back: Normal range of motion.  Lymphadenopathy:     Cervical: No cervical adenopathy.  Skin:    General: Skin is warm.  Neurological:     General: No focal deficit present.  Psychiatric:        Mood and Affect: Mood normal.      Assessment & Plan:  1. Bronchitis, unresolved -Extending cefdinir and additional 5 days - methylPREDNISolone acetate (DEPO-MEDROL) injection 40 mg - Ambulatory referral to Pulmonology  2. Shortness of breath, improved Shortness of breath has improved although remains active especially with exertional activities.  Suspect this is a lingering symptom from current reactive airway disease and further exacerbated by acute bronchitis that has not completely resolved Exam today.  Patient being given a Depo-Medrol injection here in office.  We will also refer to pulmonology as I highly recommend pulmonary function testing. -Continue rescue inhaler every 4-6 hours as needed for  shortness of breath.  May use prophylactically prior to any exertional activities to prevent shortness of breath. -Continue fluticasone 2 puffs twice daily. - methylPREDNISolone acetate (DEPO-MEDROL) injection 40 mg - Ambulatory referral to Pulmonology  3. Moderate persistent reactive airway disease with acute exacerbation Patient's lung exam continues to remain abnormal with wheezing although not as diffusely present as previous.  Given risk factors for diabetes do not want to resume another course of prednisone however will give a Depo-Medrol shot here in office today to help facilitate improvement of lung inflammation.  Encourage patient to consistently use fluticasone inhaler as this will also prevent worsening inflammation of the lungs and bronchioles.  Patient has been referred to pulmonology for work-up and evaluation including pulmonary function test. - methylPREDNISolone acetate (DEPO-MEDROL) injection 40 mg - Ambulatory referral to Pulmonology   Meds ordered this encounter  Medications  . methylPREDNISolone acetate (DEPO-MEDROL) injection 40 mg  . cefdinir (OMNICEF) 300 MG capsule    Sig: Take  2 capsules (600 mg total) by mouth daily for 5 days.    Dispense:  10 capsule    Refill:  0    Return to work note provided for a return date of 07/09/2019.  Patient advised if additional time off will be needed please follow-up with primary care provider.  Patient also advised any FMLA related paperwork should be routed to PCP.   -The patient was given clear instructions to go to ER or return to medical center if symptoms do not improve, worsen or new problems develop. The patient verbalized understanding.     Molli Barrows, FNP-C [facility], PRN Provider  [facility]. [place], [place] Clinic Phone: [phone] Clinic Fax: [phone] Clinic Hours: 5:30 pm -7:30 pm (Monday-Friday)

## 2019-07-07 NOTE — Patient Instructions (Addendum)
Complete antibiotic therapy. I am referring you to pulmonology. You may return to work on Wednesday. Continue inhaler therapy.   Acute Bronchitis, Adult  Acute bronchitis is when air tubes in the lungs (bronchi) suddenly get swollen. The condition can make it hard for you to breathe. In adults, acute bronchitis usually goes away within 2 weeks. A cough caused by bronchitis may last up to 3 weeks. Smoking, allergies, and asthma can make the condition worse. What are the causes? This condition is caused by:  Cold and flu viruses. The most common cause of this condition is the virus that causes the common cold.  Bacteria.  Substances that irritate the lungs, including: ? Smoke from cigarettes and other types of tobacco. ? Dust and pollen. ? Fumes from chemicals, gases, or burned fuel. ? Other materials that pollute indoor or outdoor air.  Close contact with someone who has acute bronchitis. What increases the risk? The following factors may make you more likely to develop this condition:  A weak body's defense system. This is also called the immune system.  Any condition that affects your lungs and breathing, such as asthma. What are the signs or symptoms? Symptoms of this condition include:  A cough.  Coughing up clear, yellow, or green mucus.  Wheezing.  Chest congestion.  Shortness of breath.  A fever.  Body aches.  Chills.  A sore throat. How is this treated? Acute bronchitis may go away over time without treatment. Your doctor may recommend:  Drinking more fluids.  Taking a medicine for a fever or cough.  Using a device that gets medicine into your lungs (inhaler).  Using a vaporizer or a humidifier. These are machines that add water or moisture in the air to help with coughing and poor breathing. Follow these instructions at home:  Activity  Get a lot of rest.  Avoid places where there are fumes from chemicals.  Return to your normal activities as  told by your doctor. Ask your doctor what activities are safe for you. Lifestyle  Drink enough fluids to keep your pee (urine) pale yellow.  Do not drink alcohol.  Do not use any products that contain nicotine or tobacco, such as cigarettes, e-cigarettes, and chewing tobacco. If you need help quitting, ask your doctor. Be aware that: ? Your bronchitis will get worse if you smoke or breathe in other people's smoke (secondhand smoke). ? Your lungs will heal faster if you quit smoking. General instructions  Take over-the-counter and prescription medicines only as told by your doctor.  Use an inhaler, cool mist vaporizer, or humidifier as told by your doctor.  Rinse your mouth often with salt water. To make salt water, dissolve -1 tsp (3-6 g) of salt in 1 cup (237 mL) of warm water.  Keep all follow-up visits as told by your doctor. This is important. How is this prevented? To lower your risk of getting this condition again:  Wash your hands often with soap and water. If soap and water are not available, use hand sanitizer.  Avoid contact with people who have cold symptoms.  Try not to touch your mouth, nose, or eyes with your hands.  Make sure to get the flu shot every year. Contact a doctor if:  Your symptoms do not get better in 2 weeks.  You vomit more than once or twice.  You have symptoms of loss of fluid from your body (dehydration). These include: ? Dark urine. ? Dry skin or eyes. ? Increased thirst. ?  Headaches. ? Confusion. ? Muscle cramps. Get help right away if:  You cough up blood.  You have chest pain.  You have very bad shortness of breath.  You become dehydrated.  You faint or keep feeling like you are going to faint.  You keep vomiting.  You have a very bad headache.  Your fever or chills get worse. These symptoms may be an emergency. Do not wait to see if the symptoms will go away. Get medical help right away. Call your local emergency  services (911 in the [place] Do not drive yourself to the hospital. Summary  Acute bronchitis is when air tubes in the lungs (bronchi) suddenly get swollen. In adults, acute bronchitis usually goes away within 2 weeks.  Take over-the-counter and prescription medicines only as told by your doctor.  Drink enough fluid to keep your pee (urine) pale yellow.  Contact a doctor if your symptoms do not improve after 2 weeks of treatment.  Get help right away if you cough up blood, faint, or have chest pain or shortness of breath. This information is not intended to replace advice given to you by your health care provider. Make sure you discuss any questions you have with your health care provider. Document Revised: 11/22/2018 Document Reviewed: 11/22/2018 Elsevier Patient Education  [place].

## 2019-10-24 ENCOUNTER — Encounter: Payer: Self-pay | Admitting: Gastroenterology

## 2019-11-07 ENCOUNTER — Other Ambulatory Visit: Payer: Self-pay | Admitting: Internal Medicine

## 2020-02-18 ENCOUNTER — Other Ambulatory Visit: Payer: Self-pay | Admitting: Internal Medicine

## 2020-02-24 DIAGNOSIS — H44113 Panuveitis, bilateral: Secondary | ICD-10-CM | POA: Insufficient documentation

## 2020-02-24 DIAGNOSIS — H2513 Age-related nuclear cataract, bilateral: Secondary | ICD-10-CM | POA: Diagnosis not present

## 2020-02-24 DIAGNOSIS — H35351 Cystoid macular degeneration, right eye: Secondary | ICD-10-CM | POA: Diagnosis not present

## 2020-02-24 DIAGNOSIS — H21541 Posterior synechiae (iris), right eye: Secondary | ICD-10-CM | POA: Diagnosis not present

## 2020-02-24 DIAGNOSIS — H4020X0 Unspecified primary angle-closure glaucoma, stage unspecified: Secondary | ICD-10-CM | POA: Insufficient documentation

## 2020-02-26 ENCOUNTER — Other Ambulatory Visit: Payer: Self-pay | Admitting: Internal Medicine

## 2020-03-10 ENCOUNTER — Telehealth: Payer: Self-pay | Admitting: *Deleted

## 2020-03-10 MED ORDER — AMLODIPINE BESYLATE 5 MG PO TABS: 5.0000 mg | ORAL_TABLET | Freq: Every day | ORAL | 3 refills | Status: DC

## 2020-03-10 MED ORDER — LISINOPRIL-HYDROCHLOROTHIAZIDE 20-25 MG PO TABS: 1.0000 | ORAL_TABLET | Freq: Every day | ORAL | 3 refills | Status: DC

## 2020-03-10 NOTE — Telephone Encounter (Signed)
Rx's sent in. °

## 2020-03-10 NOTE — Telephone Encounter (Signed)
Patient called requesting her blood pressure medication to be refilled.

## 2020-03-15 ENCOUNTER — Emergency Department (HOSPITAL_COMMUNITY): Payer: BC Managed Care – PPO

## 2020-03-15 ENCOUNTER — Emergency Department (HOSPITAL_COMMUNITY)
Admission: EM | Admit: 2020-03-15 | Discharge: 2020-03-15 | Disposition: A | Discharge status: Home / Self Care | Payer: BC Managed Care – PPO | Attending: Emergency Medicine | Admitting: Emergency Medicine

## 2020-03-15 ENCOUNTER — Encounter (HOSPITAL_COMMUNITY): Payer: Self-pay | Admitting: *Deleted

## 2020-03-15 ENCOUNTER — Other Ambulatory Visit: Payer: Self-pay

## 2020-03-15 DIAGNOSIS — I10 Essential (primary) hypertension: Secondary | ICD-10-CM | POA: Diagnosis not present

## 2020-03-15 DIAGNOSIS — R059 Cough, unspecified: Secondary | ICD-10-CM | POA: Diagnosis not present

## 2020-03-15 DIAGNOSIS — E039 Hypothyroidism, unspecified: Secondary | ICD-10-CM | POA: Diagnosis not present

## 2020-03-15 DIAGNOSIS — J069 Acute upper respiratory infection, unspecified: Secondary | ICD-10-CM | POA: Diagnosis not present

## 2020-03-15 DIAGNOSIS — H44113 Panuveitis, bilateral: Secondary | ICD-10-CM | POA: Diagnosis not present

## 2020-03-15 DIAGNOSIS — Z20822 Contact with and (suspected) exposure to covid-19: Secondary | ICD-10-CM | POA: Diagnosis not present

## 2020-03-15 DIAGNOSIS — R079 Chest pain, unspecified: Secondary | ICD-10-CM | POA: Diagnosis not present

## 2020-03-15 LAB — RESPIRATORY PANEL BY RT PCR (FLU A&B, COVID)
Influenza A by PCR: NEGATIVE
Influenza B by PCR: NEGATIVE
SARS Coronavirus 2 by RT PCR: NEGATIVE

## 2020-03-15 NOTE — ED Provider Notes (Signed)
[facility] Emergency Department Provider Note MRN:  [id number]  Arrival date & time: 03/15/20     Chief Complaint   cold symptoms   History of Present Illness   Monica Estrada is a 54 y.o. year-old female with a history of hypertension, obesity presenting to the ED with chief complaint of cough.  3 days of cough, sore throat, body aches, dull frontal headache.  Coworker recently diagnosed with COVID-19 and she has close exposure to her.  Patient is fully vaccinated.  She is endorsing some mild shortness of breath but denies chest pain, no leg pain or swelling.  No vomiting or diarrhea, no abdominal pain.  Review of Systems  A complete 10 system review of systems was obtained and all systems are negative except as noted in the HPI and PMH.   Patient's Health History    Past Medical History:  Diagnosis Date  . Abnormal Pap smear   . Colonic edema    mild left- ed eval abd ct  . Gave birth to child recently    2012  . History of hypothyroidism 1999   of post partum  . Hx of abnormal cervical Pap smear   . Hypertension   . Uveitis    stable  . Wheezing 04/01/2015    Past Surgical History:  Procedure Laterality Date  . CHOLECYSTECTOMY     1999  . TUBAL LIGATION  12/31/2010   Procedure: BILATERAL TUBAL LIGATION;  Surgeon: Blane Ohara Meisinger;  Location: [facility];  Service: Gynecology;  Laterality: Bilateral;  Bilateral post partum tubal ligation    Family History  Problem Relation Age of Onset  . Hypertension Mother   . Diabetes Mother   . Thyroid disease Mother   . Diabetes Father   . Hypertension Father   . Lupus Sister   . Rheum arthritis Sister   . Other Sister        lamb disease  . Diabetes Brother   . Diabetes Son   . Stroke Sister     Social History   Socioeconomic History  . Marital status: Single    Spouse name: Not on file  . Number of children: Not on file  . Years of education: Not on file  . Highest education level: Not on  file  Occupational History  . Not on file  Tobacco Use  . Smoking status: Never Smoker  . Smokeless tobacco: Never Used  Substance and Sexual Activity  . Alcohol use: No  . Drug use: No  . Sexual activity: Not Currently  Other Topics Concern  . Not on file  Social History Narrative   [name]  ABOUT 23 HOURS    Single   HH of 3 son  52 y  And baby girl 2 year  Father out of country comes back ocass. Some financial support .    Father  In [place].   No pets.    Neg ets .    2 jobs  hh of 2  54 yo and older teen son college                Social Determinants of Radio broadcast assistant Strain:   . Difficulty of Paying Living Expenses: Not on file  Food Insecurity:   . Worried About Charity fundraiser in the Last Year: Not on file  . Ran Out of Food in the Last Year: Not on file  Transportation Needs:   . Lack of Transportation (  Medical): Not on file  . Lack of Transportation (Non-Medical): Not on file  Physical Activity:   . Days of Exercise per Week: Not on file  . Minutes of Exercise per Session: Not on file  Stress:   . Feeling of Stress : Not on file  Social Connections:   . Frequency of Communication with Friends and Family: Not on file  . Frequency of Social Gatherings with Friends and Family: Not on file  . Attends Religious Services: Not on file  . Active Member of Clubs or Organizations: Not on file  . Attends Archivist Meetings: Not on file  . Marital Status: Not on file  Intimate Partner Violence:   . Fear of Current or Ex-Partner: Not on file  . Emotionally Abused: Not on file  . Physically Abused: Not on file  . Sexually Abused: Not on file     Physical Exam   Vitals:   03/15/20 0945 03/15/20 1000  BP: (!) 144/78 121/80  Pulse: 81 84  Resp: (!) 21 14  Temp:    SpO2: 96% 94%    CONSTITUTIONAL: Well-appearing, NAD NEURO:  Alert and oriented x 3, no focal deficits EYES:  eyes equal and reactive ENT/NECK:  no LAD, no JVD CARDIO: Regular  rate, well-perfused, normal S1 and S2 PULM:  CTAB no wheezing or rhonchi GI/GU:  normal bowel sounds, non-distended, non-tender MSK/SPINE:  No gross deformities, no edema SKIN:  no rash, atraumatic PSYCH:  Appropriate speech and behavior  *Additional and/or pertinent findings included in MDM below  Diagnostic and Interventional Summary    EKG Interpretation  Date/Time:  Monday March 15 2020 02:11:23 EDT Ventricular Rate:  81 PR Interval:  200 QRS Duration: 78 QT Interval:  380 QTC Calculation: 441 R Axis:   10 Text Interpretation: Normal sinus rhythm Low voltage QRS Borderline ECG Confirmed by Gerlene Fee [phone]) on 03/15/2020 8:54:05 AM      Labs Reviewed  RESPIRATORY PANEL BY RT PCR (FLU A&B, COVID)    DG Chest [facility] 1 View  Final Result      Medications - No data to display   Procedures  /  Critical Care Procedures  ED Course and Medical Decision Making  I have reviewed the triage vital signs, the nursing notes, and pertinent available records from the EMR.  Listed above are laboratory and imaging tests that I personally ordered, reviewed, and interpreted and then considered in my medical decision making (see below for details).  Cold-like symptoms with COVID-19 sick contact, suspicious for COVID-19.  Will swab here and if positive would consider monoclonal antibody therapy.  Monica Estrada was evaluated in Emergency Department on 03/15/2020 for the symptoms described in the history of present illness. She was evaluated in the context of the global COVID-19 pandemic, which necessitated consideration that the patient might be at risk for infection with the SARS-CoV-2 virus that causes COVID-19. Institutional protocols and algorithms that pertain to the evaluation of patients at risk for COVID-19 are in a state of rapid change based on information released by regulatory bodies including the CDC and federal and state organizations. These policies and algorithms were  followed during the patient's care in the ED.      Swabs are negative, appropriate for discharge.  Barth Kirks. Sedonia Small, MD [place] [email or web address]  Final Clinical Impressions(s) / ED Diagnoses     ICD-10-CM   1. Viral URI with cough  J06.9     ED  Discharge Orders    None       Discharge Instructions Discussed with and Provided to Patient:     Discharge Instructions     You were evaluated in the Emergency Department and after careful evaluation, we did not find any emergent condition requiring admission or further testing in the hospital.  Your exam/testing today is overall reassuring.  You have tested negative for COVID-19 and you have tested negative for the flu.  Your symptoms seem to be caused by a different virus.  We recommend Tylenol or Motrin for discomfort.  Please return to the Emergency Department if you experience any worsening of your condition.   Thank you for allowing [place] to be a part of your care.       Maudie Flakes, MD 03/15/20 1049

## 2020-03-15 NOTE — ED Triage Notes (Signed)
The pt is c/o a cold cough aches and pains since Saturday coughing   Chest congestion

## 2020-03-15 NOTE — Discharge Instructions (Addendum)
You were evaluated in the Emergency Department and after careful evaluation, we did not find any emergent condition requiring admission or further testing in the hospital.  Your exam/testing today is overall reassuring.  You have tested negative for COVID-19 and you have tested negative for the flu.  Your symptoms seem to be caused by a different virus.  We recommend Tylenol or Motrin for discomfort.  Please return to the Emergency Department if you experience any worsening of your condition.   Thank you for allowing [place] to be a part of your care.

## 2020-03-22 ENCOUNTER — Other Ambulatory Visit: Payer: Self-pay

## 2020-03-22 ENCOUNTER — Emergency Department (HOSPITAL_COMMUNITY)
Admission: EM | Admit: 2020-03-22 | Discharge: 2020-03-22 | Disposition: A | Discharge status: Home / Self Care | Payer: BC Managed Care – PPO | Attending: Emergency Medicine | Admitting: Emergency Medicine

## 2020-03-22 ENCOUNTER — Emergency Department (HOSPITAL_COMMUNITY): Payer: BC Managed Care – PPO

## 2020-03-22 ENCOUNTER — Encounter (HOSPITAL_COMMUNITY): Payer: Self-pay

## 2020-03-22 DIAGNOSIS — H35351 Cystoid macular degeneration, right eye: Secondary | ICD-10-CM | POA: Diagnosis not present

## 2020-03-22 DIAGNOSIS — Z79899 Other long term (current) drug therapy: Secondary | ICD-10-CM | POA: Insufficient documentation

## 2020-03-22 DIAGNOSIS — H4020X0 Unspecified primary angle-closure glaucoma, stage unspecified: Secondary | ICD-10-CM | POA: Diagnosis not present

## 2020-03-22 DIAGNOSIS — Z20822 Contact with and (suspected) exposure to covid-19: Secondary | ICD-10-CM | POA: Diagnosis not present

## 2020-03-22 DIAGNOSIS — H539 Unspecified visual disturbance: Secondary | ICD-10-CM

## 2020-03-22 DIAGNOSIS — J341 Cyst and mucocele of nose and nasal sinus: Secondary | ICD-10-CM | POA: Diagnosis not present

## 2020-03-22 DIAGNOSIS — E039 Hypothyroidism, unspecified: Secondary | ICD-10-CM | POA: Diagnosis not present

## 2020-03-22 DIAGNOSIS — H538 Other visual disturbances: Secondary | ICD-10-CM | POA: Insufficient documentation

## 2020-03-22 DIAGNOSIS — H5712 Ocular pain, left eye: Secondary | ICD-10-CM | POA: Insufficient documentation

## 2020-03-22 DIAGNOSIS — H21541 Posterior synechiae (iris), right eye: Secondary | ICD-10-CM | POA: Diagnosis not present

## 2020-03-22 DIAGNOSIS — H44113 Panuveitis, bilateral: Secondary | ICD-10-CM | POA: Diagnosis not present

## 2020-03-22 DIAGNOSIS — H35033 Hypertensive retinopathy, bilateral: Secondary | ICD-10-CM | POA: Diagnosis not present

## 2020-03-22 DIAGNOSIS — H40032 Anatomical narrow angle, left eye: Secondary | ICD-10-CM | POA: Diagnosis not present

## 2020-03-22 DIAGNOSIS — G43909 Migraine, unspecified, not intractable, without status migrainosus: Secondary | ICD-10-CM | POA: Diagnosis not present

## 2020-03-22 DIAGNOSIS — I10 Essential (primary) hypertension: Secondary | ICD-10-CM | POA: Diagnosis not present

## 2020-03-22 DIAGNOSIS — J3489 Other specified disorders of nose and nasal sinuses: Secondary | ICD-10-CM | POA: Diagnosis not present

## 2020-03-22 DIAGNOSIS — H2513 Age-related nuclear cataract, bilateral: Secondary | ICD-10-CM | POA: Diagnosis not present

## 2020-03-22 LAB — RESPIRATORY PANEL BY RT PCR (FLU A&B, COVID)
Influenza A by PCR: NEGATIVE
Influenza B by PCR: NEGATIVE
SARS Coronavirus 2 by RT PCR: NEGATIVE

## 2020-03-22 MED ORDER — DORZOLAMIDE HCL-TIMOLOL MAL 2-0.5 % OP SOLN
2.0000 [drp] | Freq: Two times a day (BID) | OPHTHALMIC | Status: DC
  Administered 2020-03-22: 2 [drp] via OPHTHALMIC
  Filled 2020-03-22: qty 10

## 2020-03-22 MED ORDER — TETRACAINE HCL 0.5 % OP SOLN
2.0000 [drp] | Freq: Once | OPHTHALMIC | Status: AC
  Administered 2020-03-22: 2 [drp] via OPHTHALMIC
  Filled 2020-03-22: qty 4

## 2020-03-22 MED ORDER — METOCLOPRAMIDE HCL 5 MG/ML IJ SOLN
10.0000 mg | Freq: Once | INTRAMUSCULAR | Status: AC
  Administered 2020-03-22: 10 mg via INTRAVENOUS
  Filled 2020-03-22: qty 2

## 2020-03-22 MED ORDER — DIPHENHYDRAMINE HCL 50 MG/ML IJ SOLN
25.0000 mg | Freq: Once | INTRAMUSCULAR | Status: AC
  Administered 2020-03-22: 25 mg via INTRAVENOUS
  Filled 2020-03-22: qty 1

## 2020-03-22 MED ORDER — ACETAZOLAMIDE 250 MG PO TABS
500.0000 mg | ORAL_TABLET | Freq: Once | ORAL | Status: AC
  Administered 2020-03-22: 500 mg via ORAL
  Filled 2020-03-22: qty 2

## 2020-03-22 MED ORDER — SODIUM CHLORIDE 0.9 % IV BOLUS
1000.0000 mL | Freq: Once | INTRAVENOUS | Status: AC
  Administered 2020-03-22: 1000 mL via INTRAVENOUS

## 2020-03-22 MED ORDER — OXYCODONE-ACETAMINOPHEN 5-325 MG PO TABS
2.0000 | ORAL_TABLET | Freq: Once | ORAL | Status: AC
  Administered 2020-03-22: 2 via ORAL
  Filled 2020-03-22: qty 2

## 2020-03-22 NOTE — Discharge Instructions (Addendum)
Go to the urgent clinic for ophthalmology at [facility] main campus in the [employer] tower.  He should receive a phone call shortly.  The ophthalmologist on-call would like you to take 2 drops of COSOPT in each eye and follow-up with him at 56 CT reveals partially empty sella syndrome which may be incidental- please return for reevaluation to your primary care doctor if headache continues after eye issues resolved.

## 2020-03-22 NOTE — ED Provider Notes (Signed)
  Physical Exam  BP (!) 156/103 (BP Location: Right Arm)   Pulse 87   Temp 98.4 F (36.9 C) (Oral)   Resp 19   Ht 1.626 m (5\' 4" )   Wt 81.6 kg   SpO2 96%   BMI 30.90 kg/m   Physical Exam  ED Course/Procedures     Procedures  MDM  Patient received in signout from Dr. Ron Parker.  Patient with recent history of uveitis.  She presents today with new headache.  Headache is bilateral and frontal.  Intraocular pressure significant for left eye at 44 right eye at 14. Signed out pending results of head CT. Head CT results received and patient has partial empty sella which could be consistent with increased intracranial pressure or a chronic finding. On my reevaluation of the patient she is still complaining of severe headache. Neurology consulted Reviewed optho note from 10/12   1. Panuveitis of both eyes - I have discussed the vision threatening nature/potentially blinding disease which requires chemotherapy/immune suppression. I have discussed in great detail the medication along with reviewing all the systemic implications. - Recommend initiating systemic therapy, pt understands and consents on 11/01/18  - High risk labs last drawn on 11/01/2018 - Increased MTX 12.5 mg weekly to 25 mg weekly on 11/19/18 - Tapered 30 mg oral prednisone daily (started on 11/01/18) - Patient shows signs of active inflammation on ocular exam today 02/24/20 - Patient d/c 25 mg MTX weekly with 1 mg Folic acid daily (started on 11/01/18), and Valtrex 1 g TID (started on 11/19/18) - Patient d/c Atropine TID (started on 11/01/18) - She did not continue MTX or Valtrex because she did not think it was an ongoing medication. I have emphasized that the medication regimen is ongoing and she will need close follow up (02/24/20) - I recommend starting Azathioprine. I have discussed side effects and patient consents to proceed on 02/24/20 Imuran Immunosuppression side effects: Liver failure/ toxicity, neuropathy, mild renal  impairment, bone marrow suppression, secondary infections, and lymphoproliferative disorders.  - I will draw high risk labs today 02/24/2020 - Start Azathioprine 50 mg BID today 02/24/20 - Restart PF q2hr OU today 02/24/20 - Restart Cosopt BID OU today 02/24/20 - I will see her back in 4 weeks for reevaluation. Will plan to add another medication.   2. Cystoid Macular Edema right  - Monitor  3. Posterior Synechiae Right Eye - superior pupil is freely mobile - Monitor  4. Nuclear Sclerotic Cataract Both Eyes - Must first calm inflammation before proceeding with CE  5. Hypertensive Retinopathy of both eyes  - Mild. I have recommended continued optimization of blood pressure with her primary care physician.   6. Narrow angle glaucoma of right eye  - Recommend YAG iridotomy OD 11/01/18 as she has iris bombe configuration - s/p YAG iridotomy OD 11/01/18 - Iridotomies have scarred over due to inflammation  [phone] Dr. Manuella Ghazi Discussed with Dr. Brigitte Pulse, on-call for ophthalmology.  He agrees with the Cosopt and Diamox. Awaiting neurology consult. Discussed with Dr. Manuella Ghazi and discussed differential diagnosis including increased intraocular pressure, dural vein thrombosis, psedotumor.  However, patient with increased iop at 53 which is vision threatening.  Plan d/c to go to ophthalmology directly and return if not better after definitive eye care. REferral to neurology given.    Monica Boss, MD 03/22/20 1025

## 2020-03-22 NOTE — ED Triage Notes (Signed)
Pt states that she has been having a migraine since yesterday with photosensitivity and nausea. Pt reports recent vision problems and has appt with eye doctor coming up

## 2020-03-22 NOTE — ED Provider Notes (Signed)
[facility] EMERGENCY DEPARTMENT Provider Note   CSN: [id number] Arrival date & time: 03/22/20  0335     History Chief Complaint  Patient presents with  . Migraine    Monica Estrada is a 54 y.o. female.   Migraine This is a new problem. The current episode started more than 2 days ago. The problem occurs constantly. The problem has been gradually worsening. Associated symptoms include headaches. Pertinent negatives include no chest pain and no shortness of breath. Exacerbated by: light. Nothing relieves the symptoms.       Past Medical History:  Diagnosis Date  . Abnormal Pap smear   . Colonic edema    mild left- ed eval abd ct  . Gave birth to child recently    2012  . History of hypothyroidism 1999   of post partum  . Hx of abnormal cervical Pap smear   . Hypertension   . Uveitis    stable  . Wheezing 04/01/2015    Patient Active Problem List   Diagnosis Date Noted  . Wheezing 04/01/2015  . Essential hypertension 04/17/2014  . TSH elevation 04/17/2014  . Medication management 04/17/2014  . History of hypokalemia 11/26/2012  . Tingling 11/26/2012  . BMI 32.0-32.9,adult 01/08/2012  . Umbilical hernia  possible from lap site  12/05/2011  . Preventative health care 11/12/2010  . Uveitis   . History of hypothyroidism   . NONSPECIFIC ABN FINDING RAD & OTH EXAM GI TRACT 11/25/2009  . Anemia 11/03/2009  . AUTOIMMUNE THYROIDITIS 11/20/2008  . UVEITIS 12/12/2006  . Hyperlipemia 12/11/2006    Past Surgical History:  Procedure Laterality Date  . CHOLECYSTECTOMY     1999  . TUBAL LIGATION  12/31/2010   Procedure: BILATERAL TUBAL LIGATION;  Surgeon: Blane Ohara Meisinger;  Location: [facility];  Service: Gynecology;  Laterality: Bilateral;  Bilateral post partum tubal ligation     OB History    Gravida  2   Para  2   Term  2   Preterm  0   AB  0   Living  2     SAB  0   TAB  0   Ectopic  0   Multiple  0   Live Births  1            Family History  Problem Relation Age of Onset  . Hypertension Mother   . Diabetes Mother   . Thyroid disease Mother   . Diabetes Father   . Hypertension Father   . Lupus Sister   . Rheum arthritis Sister   . Other Sister        lamb disease  . Diabetes Brother   . Diabetes Son   . Stroke Sister     Social History   Tobacco Use  . Smoking status: Never Smoker  . Smokeless tobacco: Never Used  Substance Use Topics  . Alcohol use: No  . Drug use: No    Home Medications Prior to Admission medications   Medication Sig Start Date End Date Taking? Authorizing Provider  albuterol (VENTOLIN HFA) 108 (90 Base) MCG/ACT inhaler Inhale 2 puffs into the lungs every 4 (four) hours as needed for wheezing or shortness of breath. 06/30/19   Scot Jun, FNP  amLODipine (NORVASC) 5 MG tablet Take 1 tablet (5 mg total) by mouth daily. 03/10/20   Panosh, Standley Brooking, MD  atropine 1 % ophthalmic solution Place 1 drop into the right eye 2 (two) times daily.  [provider]  benzonatate (TESSALON) 100 MG capsule Take 1 capsule (100 mg total) by mouth 3 (three) times daily as needed for cough. 06/27/19   Panosh, Standley Brooking, MD  ferrous sulfate 325 (65 FE) MG tablet Take 650 mg by mouth daily with breakfast.    [provider]  fluticasone (FLOVENT HFA) 110 MCG/ACT inhaler Inhale 2 puffs into the lungs 2 (two) times daily as needed. 06/30/19   Scot Jun, FNP  guaiFENesin-codeine 100-10 MG/5ML syrup Take 5 mLs by mouth every 6 (six) hours as needed for cough. 06/30/19   Scot Jun, FNP  HYDROcodone-homatropine (HYCODAN) 5-1.5 MG/5ML syrup Take 5 mLs by mouth every 8 (eight) hours as needed for cough (night). 06/27/19   Panosh, Standley Brooking, MD  lisinopril-hydrochlorothiazide (ZESTORETIC) 20-25 MG tablet Take 1 tablet by mouth daily. 03/10/20   Panosh, Standley Brooking, MD  methocarbamol (ROBAXIN) 500 MG tablet Take 2 tablets (1,000 mg total) by mouth every 8 (eight) hours as  needed for muscle spasms. 12/07/18   Julianne Rice, MD  naproxen (NAPROSYN) 500 MG tablet Take 1 tablet (500 mg total) by mouth 2 (two) times daily. 12/14/18   Hall-Potvin, [place], PA-C  oseltamivir (TAMIFLU) 75 MG capsule Take 1 capsule (75 mg total) by mouth 2 (two) times daily. 06/29/18   Jodelle Green, FNP    Allergies    Patient has no known allergies.  Review of Systems   Review of Systems  Constitutional: Negative for chills and fever.  HENT: Negative for congestion and rhinorrhea.   Eyes: Positive for photophobia, redness and visual disturbance.  Respiratory: Negative for cough and shortness of breath.   Cardiovascular: Negative for chest pain and palpitations.  Gastrointestinal: Negative for diarrhea, nausea and vomiting.  Genitourinary: Negative for difficulty urinating and dysuria.  Musculoskeletal: Negative for arthralgias and back pain.  Skin: Negative for rash and wound.  Neurological: Positive for headaches. Negative for light-headedness.    Physical Exam Updated Vital Signs BP (!) 156/103 (BP Location: Right Arm)   Pulse 87   Temp 98.4 F (36.9 C) (Oral)   Resp 19   Ht 5\' 4"  (1.626 m)   Wt 81.6 kg   SpO2 96%   BMI 30.90 kg/m   Physical Exam Vitals and nursing note reviewed. Exam conducted with a chaperone present.  Constitutional:      General: She is not in acute distress.    Appearance: Normal appearance.  HENT:     Head: Normocephalic and atraumatic.     Nose: No rhinorrhea.  Eyes:     General:        Right eye: No discharge.        Left eye: No discharge.     Comments: Pupils 2 mm minimally reactive to light, left eye with conjunctival injection including the limbus, Tono-Pen pressure in the left 44, 14 in the right.  Unable to perceive letters on eye exam on the left, 2120 on the right.  Cardiovascular:     Rate and Rhythm: Normal rate and regular rhythm.  Pulmonary:     Effort: Pulmonary effort is normal. No respiratory distress.     Breath  sounds: No stridor.  Abdominal:     General: Abdomen is flat. There is no distension.     Palpations: Abdomen is soft.  Musculoskeletal:        General: No tenderness or signs of injury.  Skin:    General: Skin is warm and dry.  Neurological:  General: No focal deficit present.     Mental Status: She is alert. Mental status is at baseline.     Motor: No weakness.  Psychiatric:        Mood and Affect: Mood normal.        Behavior: Behavior normal.     ED Results / Procedures / Treatments   Labs (all labs ordered are listed, but only abnormal results are displayed) Labs Reviewed - No data to display  EKG None  Radiology No results found.  Procedures Procedures (including critical care time)  Medications Ordered in ED Medications  oxyCODONE-acetaminophen (PERCOCET/ROXICET) 5-325 MG per tablet 2 tablet (has no administration in time range)  tetracaine (PONTOCAINE) 0.5 % ophthalmic solution 2 drop (2 drops Both Eyes Given 03/22/20 0609)  metoCLOPramide (REGLAN) injection 10 mg (10 mg Intravenous Given 03/22/20 0607)  diphenhydrAMINE (BENADRYL) injection 25 mg (25 mg Intravenous Given 03/22/20 0608)  sodium chloride 0.9 % bolus 1,000 mL (1,000 mLs Intravenous New Bag/Given 03/22/20 0604)    ED Course  I have reviewed the triage vital signs and the nursing notes.  Pertinent labs & imaging results that were available during my care of the patient were reviewed by me and considered in my medical decision making (see chart for details).    MDM Rules/Calculators/A&P                          Severe headache.  History of uveitis and glaucoma.  Followed by [facility] ophthalmology.  Neurologic exam other than documented pupillary change is unremarkable for any acute findings.  She will get a CT scan of her head to evaluate for intracranial abnormality.  She will get the ophthalmology consult.   I spoke with Dr. Tomasa Hosteller from [facility] ophthalmology, they recommend she take 2  of her COSOPT drops in each eye and follow-up with the urgent care clinic immediately.  T scan she will be safe for discharge to the clinic for further management of her eye pain.  Oral pain control given after IV medication of Reglan fluids and Benadryl was given.  Pt care was handed off to on coming provider at 0730.  Complete history and physical and current plan have been communicated.  Please refer to their note for the remainder of ED care and ultimate disposition.  Pt seen in conjunction with Dr. Jeanell Sparrow  Final Clinical Impression(s) / ED Diagnoses Final diagnoses:  Vision changes  Pain of left eye    Rx / DC Orders ED Discharge Orders    None       Breck Coons, MD 03/22/20 [phone]

## 2020-03-22 NOTE — ED Notes (Signed)
Pt moved to purple for neuro to assess.

## 2020-03-23 DIAGNOSIS — H35351 Cystoid macular degeneration, right eye: Secondary | ICD-10-CM | POA: Diagnosis not present

## 2020-03-31 ENCOUNTER — Ambulatory Visit (INDEPENDENT_AMBULATORY_CARE_PROVIDER_SITE_OTHER): Payer: BC Managed Care – PPO | Admitting: Internal Medicine

## 2020-03-31 ENCOUNTER — Other Ambulatory Visit: Payer: Self-pay

## 2020-03-31 ENCOUNTER — Encounter: Payer: Self-pay | Admitting: Internal Medicine

## 2020-03-31 VITALS — BP 118/74 | HR 66 | Temp 98.7°F | Ht 64.0 in | Wt 200.4 lb

## 2020-03-31 DIAGNOSIS — R945 Abnormal results of liver function studies: Secondary | ICD-10-CM

## 2020-03-31 DIAGNOSIS — I10 Essential (primary) hypertension: Secondary | ICD-10-CM | POA: Diagnosis not present

## 2020-03-31 DIAGNOSIS — R7989 Other specified abnormal findings of blood chemistry: Secondary | ICD-10-CM

## 2020-03-31 DIAGNOSIS — R519 Headache, unspecified: Secondary | ICD-10-CM | POA: Diagnosis not present

## 2020-03-31 DIAGNOSIS — Z79899 Other long term (current) drug therapy: Secondary | ICD-10-CM

## 2020-03-31 DIAGNOSIS — R930 Abnormal findings on diagnostic imaging of skull and head, not elsewhere classified: Secondary | ICD-10-CM

## 2020-03-31 DIAGNOSIS — R6889 Other general symptoms and signs: Secondary | ICD-10-CM | POA: Diagnosis not present

## 2020-03-31 DIAGNOSIS — H35359 Cystoid macular degeneration, unspecified eye: Secondary | ICD-10-CM

## 2020-03-31 DIAGNOSIS — H539 Unspecified visual disturbance: Secondary | ICD-10-CM

## 2020-03-31 NOTE — Progress Notes (Signed)
Chief Complaint  Patient presents with  . Follow-up    discharge from hospital 2 weeks ago.    HPI: Monica Estrada 55 y.o. come in for medication ?  Fu ed hops evaluation  11  Where she presented with headache in which she called with loss of vision or just difficult vision. She was evaluated had a CT scan which showed a partially empty sella and consideration of intracranial hypertension was transferred to [facility] where her ophthalmology eye doctor is located.  She was treated with drops and put on acid dorzolamide 500 mg twice a day Her headache is better when she takes all of her medicine.  It is still a problem.  She has a follow-up in 3 days. She missed work after the evaluation and was supposed to go back 2 days ago would like to have a note to continue until more evaluation and feels better.  She was also noted to have abnormal liver tests through that evaluation with hepatitis serologies normal.  She believes her blood pressure has been in control and she had been taking her blood pressure medicine which is amlodipine and lisinopril HCTZ.  Ever since this began she states that her memory is not right takes her longer to remember things but no focal motor symptoms. Is unclear whether she had an MRI of her brain she alludes to it but none found in the record.   She is currently not having chest pain or shortness of breath but just a mild headache and uncomfortable. 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  Last visit with this provider was March 2020. ROS: See pertinent positives and negatives per HPI. She works at [employer]. Past Medical History:  Diagnosis Date  . Abnormal Pap smear   . Colonic edema    mild left- ed eval abd ct  . Gave birth to child recently    2012  . History of hypothyroidism 1999   of post partum  . Hx of abnormal cervical Pap smear   .  Hypertension   . Uveitis    stable  . Wheezing 04/01/2015    Family History  Problem Relation Age of Onset  . Hypertension Mother   . Diabetes Mother   . Thyroid disease Mother   . Diabetes Father   . Hypertension Father   . Lupus Sister   . Rheum arthritis Sister   . Other Sister        lamb disease  . Diabetes Brother   . Diabetes Son   . Stroke Sister     Social History   Socioeconomic History  . Marital status: Single    Spouse name: Not on file  . Number of children: Not on file  . Years of education: Not on file  . Highest education level: Not on file  Occupational History  . Not on file  Tobacco Use  . Smoking status: Never Smoker  . Smokeless tobacco: Never Used  Substance and Sexual Activity  . Alcohol use: No  . Drug use: No  . Sexual activity: Not Currently  Other Topics Concern  . Not on file  Social History Narrative   [name]  ABOUT 77 HOURS    Single   HH of 3 son  75 y  And baby girl 2 year  Father out of country comes back ocass. Some financial support .  Father  In [place].   No pets.    Neg ets .    2 jobs  hh of 2  40 yo and older teen son college                Social Determinants of Radio broadcast assistant Strain:   . Difficulty of Paying Living Expenses: Not on file  Food Insecurity:   . Worried About Charity fundraiser in the Last Year: Not on file  . Ran Out of Food in the Last Year: Not on file  Transportation Needs:   . Lack of Transportation (Medical): Not on file  . Lack of Transportation (Non-Medical): Not on file  Physical Activity:   . Days of Exercise per Week: Not on file  . Minutes of Exercise per Session: Not on file  Stress:   . Feeling of Stress : Not on file  Social Connections:   . Frequency of Communication with Friends and Family: Not on file  . Frequency of Social Gatherings with Friends and Family: Not on file  . Attends Religious Services: Not on file  . Active Member of Clubs or Organizations: Not on  file  . Attends Archivist Meetings: Not on file  . Marital Status: Not on file    Outpatient Medications Prior to Visit  Medication Sig Dispense Refill  . albuterol (VENTOLIN HFA) 108 (90 Base) MCG/ACT inhaler Inhale 2 puffs into the lungs every 4 (four) hours as needed for wheezing or shortness of breath. 18 g 1  . amLODipine (NORVASC) 5 MG tablet Take 1 tablet (5 mg total) by mouth daily. 90 tablet 3  . atropine 1 % ophthalmic solution Place 1 drop into the right eye 2 (two) times daily.    . ferrous sulfate 325 (65 FE) MG tablet Take 650 mg by mouth daily with breakfast.    . fluticasone (FLOVENT HFA) 110 MCG/ACT inhaler Inhale 2 puffs into the lungs 2 (two) times daily as needed. 1 Inhaler 12  . lisinopril-hydrochlorothiazide (ZESTORETIC) 20-25 MG tablet Take 1 tablet by mouth daily. 90 tablet 3  . naproxen (NAPROSYN) 500 MG tablet Take 1 tablet (500 mg total) by mouth 2 (two) times daily. 30 tablet 0  . oseltamivir (TAMIFLU) 75 MG capsule Take 1 capsule (75 mg total) by mouth 2 (two) times daily. 10 capsule 0  . benzonatate (TESSALON) 100 MG capsule Take 1 capsule (100 mg total) by mouth 3 (three) times daily as needed for cough. 24 capsule 0  . guaiFENesin-codeine 100-10 MG/5ML syrup Take 5 mLs by mouth every 6 (six) hours as needed for cough. (Patient not taking: Reported on 03/31/2020) 140 mL 0  . HYDROcodone-homatropine (HYCODAN) 5-1.5 MG/5ML syrup Take 5 mLs by mouth every 8 (eight) hours as needed for cough (night). (Patient not taking: Reported on 03/31/2020) 120 mL 0  . methocarbamol (ROBAXIN) 500 MG tablet Take 2 tablets (1,000 mg total) by mouth every 8 (eight) hours as needed for muscle spasms. (Patient not taking: Reported on 03/31/2020) 20 tablet 0   Facility-Administered Medications Prior to Visit  Medication Dose Route Frequency Provider Last Rate Last Admin  . methylPREDNISolone sodium succinate (SOLU-MEDROL) 125 mg/2 mL injection 125 mg  125 mg Intramuscular  Once Scot Jun, FNP         EXAM:  BP 118/74   Pulse 66   Temp 98.7 F (37.1 C) (Oral)   Ht 5\' 4"  (1.626 m)   Wt 200 lb 6.4  oz (90.9 kg)   SpO2 100%   BMI 34.40 kg/m   Body mass index is 34.4 kg/m.  GENERAL: vitals reviewed and listed above, alert, oriented, appears well hydrated and in no acute distress she appears a bit uncomfortable prefers to keep her eyes apart shot but no true photophobia.  Atraumatic HEENT: atraumatic, conjunctiva  clear, no obvious abnormalities on inspection of external nose and ears TMs are clear.  OP : Masked NECK: no obvious masses on inspection palpation  LUNGS: clear to auscultation bilaterally, no wheezes, rales or rhonchi, good air movement CV: HRRR, no clubbing cyanosis or  peripheral edema nl cap refill  Abdomen soft without again a megaly guarding or rebound. MS: moves all extremities without noticeable focal  Abnormality Abdomen soft without again a megaly guarding or rebound.  PSYCH: pleasant and cooperative, appears to be tired and a bit uncomfortable. Lab Results  Component Value Date   WBC 5.3 06/30/2019   HGB 13.4 06/30/2019   HCT 42.1 06/30/2019   PLT 181 06/30/2019   GLUCOSE 70 09/24/2017   CHOL 199 09/24/2017   TRIG 116.0 09/24/2017   HDL 50.50 09/24/2017   LDLDIRECT 144.5 10/31/2010   LDLCALC 126 (H) 09/24/2017   ALT 13 09/24/2017   AST 14 09/24/2017   NA 141 09/24/2017   K 3.8 09/24/2017   CL 104 09/24/2017   CREATININE 0.75 09/24/2017   BUN 10 09/24/2017   CO2 30 09/24/2017   TSH 4.10 09/24/2017   HGBA1C 4.7 09/24/2017   BP Readings from Last 3 Encounters:  03/31/20 118/74  03/22/20 (!) 159/83  03/15/20 121/80  Record review [facility] care everywhere, uncertain if review is complete based on history. 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 They discussed methotrexate tapering prednisone  treatment.  And apparently has been on azathioprine.  ASSESSMENT AND PLAN:  Discussed the following assessment and plan:  Essential hypertension  Medication management  Forgetfulness  Headache, unspecified headache type  Abnormal head CT  Abnormal LFTs  Vision changes  Cystoid macular edema, unspecified laterality Diffuse history with gaps of information.  Her liver tests were abnormal and she will need a follow-up on chemistry because of new medicines added. I did write a note for being out of work for the few days this week but will not be able to do an FMLA form for the whole picture. She should keep her appointment the end of this week and then keep me updated about the next step. Her blood pressure today was in a good range.  Told her to continue medication. Her med list with [place] is obviously incomplete.  At this time. Uncertain if she had a neurology consult on the team  in regard to potential intracranial hypertension. Record review visit and counseling 55 minutes -Patient advised to return or notify health care team  if  new concerns arise.  Patient Instructions  Your BP  is good today .  Will need follow up lab eventually  Liver panel and  chemistry  Let us know if we need to help doing this   FU with your specialist and go from there .  Flu shot when you feel .  Due for colon cancer screening .   Send in message about how doing and plan . After visit .  Have your specialist  And you decide on work .         Mariann Laster  KRegis Bill M.D.

## 2020-03-31 NOTE — Patient Instructions (Addendum)
Your BP  is good today .  Will need follow up lab eventually  Liver panel and  chemistry  Let us know if we need to help doing this   FU with your specialist and go from there .  Flu shot when you feel .  Due for colon cancer screening .   Send in message about how doing and plan . After visit .  Have your specialist  And you decide on work .

## 2020-05-04 DIAGNOSIS — H35351 Cystoid macular degeneration, right eye: Secondary | ICD-10-CM | POA: Diagnosis not present

## 2020-05-12 DIAGNOSIS — N9089 Other specified noninflammatory disorders of vulva and perineum: Secondary | ICD-10-CM | POA: Diagnosis not present

## 2020-05-12 DIAGNOSIS — Z130 Encounter for screening for diseases of the blood and blood-forming organs and certain disorders involving the immune mechanism: Secondary | ICD-10-CM | POA: Diagnosis not present

## 2020-05-12 DIAGNOSIS — Z1231 Encounter for screening mammogram for malignant neoplasm of breast: Secondary | ICD-10-CM | POA: Diagnosis not present

## 2020-05-12 DIAGNOSIS — Z01419 Encounter for gynecological examination (general) (routine) without abnormal findings: Secondary | ICD-10-CM | POA: Diagnosis not present

## 2020-05-12 DIAGNOSIS — Z1389 Encounter for screening for other disorder: Secondary | ICD-10-CM | POA: Diagnosis not present

## 2020-05-12 DIAGNOSIS — N763 Subacute and chronic vulvitis: Secondary | ICD-10-CM | POA: Diagnosis not present

## 2020-05-13 ENCOUNTER — Telehealth: Payer: Self-pay | Admitting: Internal Medicine

## 2020-05-13 NOTE — Telephone Encounter (Signed)
Patient wants to know if a doctor can call in Albuterol in a liquid form for her to use in a nebulizer because it works better than the hand held inhaler.  [id number] - [place], [place] - [address] Phone:  [phone]  Fax:  [phone]

## 2020-05-13 NOTE — Telephone Encounter (Signed)
Is this okay to  change med for her nebulizer, please advise

## 2020-05-16 NOTE — Telephone Encounter (Signed)
Need visit   Virtual  Or in person ( if covid neg screen)  On record review  I havent ordered this med for   A year or more .

## 2020-05-17 ENCOUNTER — Ambulatory Visit
Admission: EM | Admit: 2020-05-17 | Discharge: 2020-05-17 | Disposition: A | Discharge status: Home / Self Care | Payer: BC Managed Care – PPO | Attending: Emergency Medicine | Admitting: Emergency Medicine

## 2020-05-17 ENCOUNTER — Ambulatory Visit (INDEPENDENT_AMBULATORY_CARE_PROVIDER_SITE_OTHER): Payer: BC Managed Care – PPO

## 2020-05-17 DIAGNOSIS — R0602 Shortness of breath: Secondary | ICD-10-CM | POA: Diagnosis not present

## 2020-05-17 DIAGNOSIS — R062 Wheezing: Secondary | ICD-10-CM

## 2020-05-17 DIAGNOSIS — J209 Acute bronchitis, unspecified: Secondary | ICD-10-CM

## 2020-05-17 DIAGNOSIS — I10 Essential (primary) hypertension: Secondary | ICD-10-CM | POA: Diagnosis not present

## 2020-05-17 DIAGNOSIS — R059 Cough, unspecified: Secondary | ICD-10-CM | POA: Diagnosis not present

## 2020-05-17 MED ORDER — PREDNISONE 50 MG PO TABS: 50.0000 mg | ORAL_TABLET | Freq: Every day | ORAL | 0 refills | Status: DC

## 2020-05-17 MED ORDER — IPRATROPIUM-ALBUTEROL 0.5-2.5 (3) MG/3ML IN SOLN: 3.0000 mL | RESPIRATORY_TRACT | 0 refills | Status: DC | PRN

## 2020-05-17 MED ORDER — DM-GUAIFENESIN ER 30-600 MG PO TB12: 1.0000 | ORAL_TABLET | Freq: Two times a day (BID) | ORAL | 0 refills | Status: DC

## 2020-05-17 MED ORDER — ALBUTEROL SULFATE HFA 108 (90 BASE) MCG/ACT IN AERS
2.0000 | INHALATION_SPRAY | Freq: Once | RESPIRATORY_TRACT | Status: AC
  Administered 2020-05-17: 2 via RESPIRATORY_TRACT

## 2020-05-17 MED ORDER — METHYLPREDNISOLONE SODIUM SUCC 125 MG IJ SOLR
125.0000 mg | Freq: Once | INTRAMUSCULAR | Status: AC
  Administered 2020-05-17: 125 mg via INTRAMUSCULAR

## 2020-05-17 MED ORDER — BENZONATATE 200 MG PO CAPS: 200.0000 mg | ORAL_CAPSULE | Freq: Three times a day (TID) | ORAL | 0 refills | Status: DC | PRN

## 2020-05-17 NOTE — ED Provider Notes (Signed)
[name]    CSN: [id number] Arrival date & time: 05/17/20  1601      History   Chief Complaint Chief Complaint  Patient presents with  . Shortness of Breath    HPI Monica Estrada is a 55 y.o. female history of hypertension presenting today for evaluation of shortness of breath.  Reports she has had cough shortness of breath and chest tightness for approximately 3 to 4 days.  History of bronchitis twice annually, feels similar.  Using albuterol inhaler at home without relief.  Denies any significant URI symptoms.  Denies close sick contacts.  Denies history of tobacco use.  HPI  Past Medical History:  Diagnosis Date  . Abnormal Pap smear   . Colonic edema    mild left- ed eval abd ct  . Gave birth to child recently    2012  . History of hypothyroidism 1999   of post partum  . Hx of abnormal cervical Pap smear   . Hypertension   . Uveitis    stable  . Wheezing 04/01/2015    Patient Active Problem List   Diagnosis Date Noted  . Wheezing 04/01/2015  . Essential hypertension 04/17/2014  . TSH elevation 04/17/2014  . Medication management 04/17/2014  . History of hypokalemia 11/26/2012  . Tingling 11/26/2012  . BMI 32.0-32.9,adult 01/08/2012  . Umbilical hernia  possible from lap site  12/05/2011  . Preventative health care 11/12/2010  . Uveitis   . History of hypothyroidism   . NONSPECIFIC ABN FINDING RAD & OTH EXAM GI TRACT 11/25/2009  . Anemia 11/03/2009  . AUTOIMMUNE THYROIDITIS 11/20/2008  . UVEITIS 12/12/2006  . Hyperlipemia 12/11/2006    Past Surgical History:  Procedure Laterality Date  . CHOLECYSTECTOMY     1999  . TUBAL LIGATION  12/31/2010   Procedure: BILATERAL TUBAL LIGATION;  Surgeon: Leighton Roach Meisinger;  Location: [facility];  Service: Gynecology;  Laterality: Bilateral;  Bilateral post partum tubal ligation    OB History    Gravida  2   Para  2   Term  2   Preterm  0   AB  0   Living  2     SAB  0   IAB  0    Ectopic  0   Multiple  0   Live Births  1            Home Medications    Prior to Admission medications   Medication Sig Start Date End Date Taking? Authorizing Provider  albuterol (VENTOLIN HFA) 108 (90 Base) MCG/ACT inhaler Inhale 2 puffs into the lungs every 4 (four) hours as needed for wheezing or shortness of breath. 06/30/19  Yes Bing Neighbors, FNP  amLODipine (NORVASC) 5 MG tablet Take 1 tablet (5 mg total) by mouth daily. 03/10/20  Yes Panosh, Neta Mends, MD  benzonatate (TESSALON) 200 MG capsule Take 1 capsule (200 mg total) by mouth 3 (three) times daily as needed for up to 7 days for cough. 05/17/20 05/24/20 Yes Safwan Tomei C, PA-C  dextromethorphan-guaiFENesin (MUCINEX DM) 30-600 MG 12hr tablet Take 1 tablet by mouth 2 (two) times daily. 05/17/20  Yes Eashan Schipani C, PA-C  fluticasone (FLOVENT HFA) 110 MCG/ACT inhaler Inhale 2 puffs into the lungs 2 (two) times daily as needed. 06/30/19  Yes Bing Neighbors, FNP  ipratropium-albuterol (DUONEB) 0.5-2.5 (3) MG/3ML SOLN Take 3 mLs by nebulization every 4 (four) hours as needed. 05/17/20  Yes Jakaila Norment C, PA-C  lisinopril-hydrochlorothiazide (  ZESTORETIC) 20-25 MG tablet Take 1 tablet by mouth daily. 03/10/20  Yes Panosh, Standley Brooking, MD  predniSONE (DELTASONE) 50 MG tablet Take 1 tablet (50 mg total) by mouth daily with breakfast for 5 days. 05/17/20 05/22/20 Yes Alyshia Kernan C, PA-C  atropine 1 % ophthalmic solution Place 1 drop into the right eye 2 (two) times daily.    [provider]  ferrous sulfate 325 (65 FE) MG tablet Take 650 mg by mouth daily with breakfast.    [provider]    Family History Family History  Problem Relation Age of Onset  . Hypertension Mother   . Diabetes Mother   . Thyroid disease Mother   . Diabetes Father   . Hypertension Father   . Lupus Sister   . Rheum arthritis Sister   . Other Sister        lamb disease  . Diabetes Brother   . Diabetes Son   . Stroke Sister      Social History Social History   Tobacco Use  . Smoking status: Never Smoker  . Smokeless tobacco: Never Used  Substance Use Topics  . Alcohol use: No  . Drug use: No     Allergies   Patient has no known allergies.   Review of Systems Review of Systems  Constitutional: Negative for activity change, appetite change, chills, fatigue and fever.  HENT: Negative for congestion, ear pain, rhinorrhea, sinus pressure, sore throat and trouble swallowing.   Eyes: Negative for discharge and redness.  Respiratory: Positive for cough, chest tightness, shortness of breath and wheezing.   Cardiovascular: Negative for chest pain.  Gastrointestinal: Negative for abdominal pain, diarrhea, nausea and vomiting.  Musculoskeletal: Negative for myalgias.  Skin: Negative for rash.  Neurological: Negative for dizziness, light-headedness and headaches.     Physical Exam Triage Vital Signs ED Triage Vitals  Enc Vitals Group     BP      Pulse      Resp      Temp      Temp src      SpO2      Weight      Height      Head Circumference      Peak Flow      Pain Score      Pain Loc      Pain Edu?      Excl. in [place]?    No data found.  Updated Vital Signs BP (!) 162/104 (BP Location: Left Arm)   Pulse 91   Temp 98.7 F (37.1 C) (Oral)   Resp (!) 24   Wt 190 lb (86.2 kg)   SpO2 92%   BMI 32.61 kg/m   Visual Acuity Right Eye Distance:   Left Eye Distance:   Bilateral Distance:    Right Eye Near:   Left Eye Near:    Bilateral Near:     Physical Exam Vitals and nursing note reviewed.  Constitutional:      Appearance: She is well-developed and well-nourished.     Comments: No acute distress  HENT:     Head: Normocephalic and atraumatic.     Ears:     Comments: Bilateral ears without tenderness to palpation of external auricle, tragus and mastoid, EAC's without erythema or swelling, TM's with good bony landmarks and cone of light. Non erythematous.     Nose: Nose normal.      Mouth/Throat:     Comments: Oral mucosa pink and moist, no  tonsillar enlargement or exudate. Posterior pharynx patent and nonerythematous, no uvula deviation or swelling. Normal phonation. Eyes:     Conjunctiva/sclera: Conjunctivae normal.  Cardiovascular:     Rate and Rhythm: Normal rate.  Pulmonary:     Effort: Pulmonary effort is normal. No respiratory distress.     Comments: Inspiratory expiratory wheezing and rhonchi noted throughout bilateral lung fields Abdominal:     General: There is no distension.  Musculoskeletal:        General: Normal range of motion.     Cervical back: Neck supple.  Skin:    General: Skin is warm and dry.  Neurological:     Mental Status: She is alert and oriented to person, place, and time.  Psychiatric:        Mood and Affect: Mood and affect normal.      UC Treatments / Results  Labs (all labs ordered are listed, but only abnormal results are displayed) Labs Reviewed  NOVEL CORONAVIRUS, NAA    EKG   Radiology DG Chest 2 View  Result Date: 05/17/2020 CLINICAL DATA:  Wheezing, cough, and shortness of breath for 4 days, O2 saturation 92%, history hypertension EXAM: CHEST - 2 VIEW COMPARISON:  03/15/2020 FINDINGS: Normal heart size, mediastinal contours, and pulmonary vascularity. Lungs clear. No infiltrate, pleural effusion, or pneumothorax. Biconvex thoracic scoliosis. IMPRESSION: No acute abnormalities. Electronically Signed   By: Lavonia Dana M.D.   On: 05/17/2020 10:44    Procedures Procedures (including critical care time)  Medications Ordered in UC Medications  albuterol (VENTOLIN HFA) 108 (90 Base) MCG/ACT inhaler 2 puff (2 puffs Inhalation Given 05/17/20 1032)  methylPREDNISolone sodium succinate (SOLU-MEDROL) 125 mg/2 mL injection 125 mg (125 mg Intramuscular Given 05/17/20 1033)    Initial Impression / Assessment and Plan / UC Course  I have reviewed the triage vital signs and the nursing notes.  Pertinent labs & imaging  results that were available during my care of the patient were reviewed by me and considered in my medical decision making (see chart for details).     Patient with bronchitis, chest x-ray negative.  Provided Solu-Medrol and 2 puffs of albuterol without significant improvement in her O2.  Remaining around 92%.  Due to Covid pandemic and no longer doing breathing treatments in clinic, patient does have nebulizer at home and will send home with duo nebs to try at home, if not improving with these patient will go to emergency room within the next 24 to 48 hours.  Continuing on prednisone, Tessalon and Mucinex for congestion and cough.  Discussed strict return precautions. Patient verbalized understanding and is agreeable with plan.  Final Clinical Impressions(s) / UC Diagnoses   Final diagnoses:  Acute bronchitis, unspecified organism     Discharge Instructions     Chest x-ray without any pneumonia We gave you Solu-Medrol, continue with prednisone daily for 5 days with food Please try DuoNeb's at home, if not improving in the next 24 hours please go to the emergency room May use Tessalon for cough as needed Mucinex DM for further cough and congestion relief     ED Prescriptions    Medication Sig Dispense Auth. Provider   ipratropium-albuterol (DUONEB) 0.5-2.5 (3) MG/3ML SOLN Take 3 mLs by nebulization every 4 (four) hours as needed. 360 mL Donaven Criswell C, PA-C   benzonatate (TESSALON) 200 MG capsule Take 1 capsule (200 mg total) by mouth 3 (three) times daily as needed for up to 7 days for cough. 28 capsule Jonice Cerra, [place]  C, PA-C   predniSONE (DELTASONE) 50 MG tablet Take 1 tablet (50 mg total) by mouth daily with breakfast for 5 days. 5 tablet Khristy Kalan C, PA-C   dextromethorphan-guaiFENesin (MUCINEX DM) 30-600 MG 12hr tablet Take 1 tablet by mouth 2 (two) times daily. 20 tablet Chrisanne Loose, [place] C, PA-C     PDMP not reviewed this encounter.   Janith Lima,  PA-C 05/17/20 1128

## 2020-05-17 NOTE — ED Triage Notes (Signed)
Patient presents to Urgent Care with complaints of SOB x 3 days r/t to her acute bronchitis. Patient reports inhaler not relieving her symptoms.

## 2020-05-17 NOTE — Discharge Instructions (Signed)
Chest x-ray without any pneumonia We gave you Solu-Medrol, continue with prednisone daily for 5 days with food Please try DuoNeb's at home, if not improving in the next 24 hours please go to the emergency room May use Tessalon for cough as needed Mucinex DM for further cough and congestion relief

## 2020-05-18 ENCOUNTER — Encounter (HOSPITAL_COMMUNITY): Payer: Self-pay

## 2020-05-18 ENCOUNTER — Other Ambulatory Visit: Payer: Self-pay

## 2020-05-18 ENCOUNTER — Inpatient Hospital Stay (HOSPITAL_COMMUNITY)
Admission: EM | Admit: 2020-05-18 | Discharge: 2020-05-25 | DRG: 202 | Disposition: A | Discharge status: Home / Self Care | Payer: BC Managed Care – PPO | Attending: Internal Medicine | Admitting: Internal Medicine

## 2020-05-18 DIAGNOSIS — Z23 Encounter for immunization: Secondary | ICD-10-CM | POA: Diagnosis not present

## 2020-05-18 DIAGNOSIS — J9601 Acute respiratory failure with hypoxia: Secondary | ICD-10-CM | POA: Diagnosis present

## 2020-05-18 DIAGNOSIS — Z7951 Long term (current) use of inhaled steroids: Secondary | ICD-10-CM | POA: Diagnosis not present

## 2020-05-18 DIAGNOSIS — Z8349 Family history of other endocrine, nutritional and metabolic diseases: Secondary | ICD-10-CM | POA: Diagnosis not present

## 2020-05-18 DIAGNOSIS — Z832 Family history of diseases of the blood and blood-forming organs and certain disorders involving the immune mechanism: Secondary | ICD-10-CM

## 2020-05-18 DIAGNOSIS — I10 Essential (primary) hypertension: Secondary | ICD-10-CM | POA: Diagnosis not present

## 2020-05-18 DIAGNOSIS — E063 Autoimmune thyroiditis: Secondary | ICD-10-CM | POA: Diagnosis not present

## 2020-05-18 DIAGNOSIS — J210 Acute bronchiolitis due to respiratory syncytial virus: Secondary | ICD-10-CM | POA: Diagnosis not present

## 2020-05-18 DIAGNOSIS — B974 Respiratory syncytial virus as the cause of diseases classified elsewhere: Secondary | ICD-10-CM

## 2020-05-18 DIAGNOSIS — E785 Hyperlipidemia, unspecified: Secondary | ICD-10-CM | POA: Diagnosis present

## 2020-05-18 DIAGNOSIS — Z8249 Family history of ischemic heart disease and other diseases of the circulatory system: Secondary | ICD-10-CM

## 2020-05-18 DIAGNOSIS — Z823 Family history of stroke: Secondary | ICD-10-CM | POA: Diagnosis not present

## 2020-05-18 DIAGNOSIS — D72829 Elevated white blood cell count, unspecified: Secondary | ICD-10-CM | POA: Diagnosis present

## 2020-05-18 DIAGNOSIS — H209 Unspecified iridocyclitis: Secondary | ICD-10-CM | POA: Diagnosis not present

## 2020-05-18 DIAGNOSIS — E669 Obesity, unspecified: Secondary | ICD-10-CM | POA: Diagnosis present

## 2020-05-18 DIAGNOSIS — Y92239 Unspecified place in hospital as the place of occurrence of the external cause: Secondary | ICD-10-CM | POA: Diagnosis present

## 2020-05-18 DIAGNOSIS — T380X5A Adverse effect of glucocorticoids and synthetic analogues, initial encounter: Secondary | ICD-10-CM | POA: Diagnosis not present

## 2020-05-18 DIAGNOSIS — E876 Hypokalemia: Secondary | ICD-10-CM | POA: Diagnosis not present

## 2020-05-18 DIAGNOSIS — R0602 Shortness of breath: Secondary | ICD-10-CM | POA: Diagnosis not present

## 2020-05-18 DIAGNOSIS — Z8261 Family history of arthritis: Secondary | ICD-10-CM

## 2020-05-18 DIAGNOSIS — Z79899 Other long term (current) drug therapy: Secondary | ICD-10-CM

## 2020-05-18 DIAGNOSIS — Z6832 Body mass index (BMI) 32.0-32.9, adult: Secondary | ICD-10-CM

## 2020-05-18 DIAGNOSIS — B338 Other specified viral diseases: Secondary | ICD-10-CM

## 2020-05-18 DIAGNOSIS — Z9221 Personal history of antineoplastic chemotherapy: Secondary | ICD-10-CM | POA: Diagnosis not present

## 2020-05-18 DIAGNOSIS — J45901 Unspecified asthma with (acute) exacerbation: Secondary | ICD-10-CM

## 2020-05-18 DIAGNOSIS — Z833 Family history of diabetes mellitus: Secondary | ICD-10-CM | POA: Diagnosis not present

## 2020-05-18 DIAGNOSIS — Z20822 Contact with and (suspected) exposure to covid-19: Secondary | ICD-10-CM | POA: Diagnosis not present

## 2020-05-18 LAB — SARS-COV-2, NAA 2 DAY TAT

## 2020-05-18 LAB — NOVEL CORONAVIRUS, NAA: SARS-CoV-2, NAA: NOT DETECTED

## 2020-05-18 MED ORDER — ALBUTEROL SULFATE (2.5 MG/3ML) 0.083% IN NEBU
5.0000 mg | INHALATION_SOLUTION | Freq: Once | RESPIRATORY_TRACT | Status: AC
  Administered 2020-05-18: 5 mg via RESPIRATORY_TRACT
  Filled 2020-05-18: qty 6

## 2020-05-18 NOTE — ED Triage Notes (Addendum)
Pt reports sob for the past 3 days with cough, hx of bronchitis, denies fever/chills. Pt has had covid vaccines with booster. Pt using albuterol inhaler without relief. Pt had negative chest xray and COVID test at [facility] yesterday

## 2020-05-18 NOTE — Telephone Encounter (Signed)
Please schedule

## 2020-05-19 ENCOUNTER — Encounter (HOSPITAL_COMMUNITY): Payer: Self-pay | Admitting: Internal Medicine

## 2020-05-19 ENCOUNTER — Emergency Department (HOSPITAL_COMMUNITY): Payer: BC Managed Care – PPO

## 2020-05-19 DIAGNOSIS — J210 Acute bronchiolitis due to respiratory syncytial virus: Secondary | ICD-10-CM | POA: Diagnosis not present

## 2020-05-19 LAB — BASIC METABOLIC PANEL
Anion gap: 13 (ref 5–15)
BUN: 22 mg/dL — ABNORMAL HIGH (ref 6–20)
CO2: 19 mmol/L — ABNORMAL LOW (ref 22–32)
Calcium: 9.6 mg/dL (ref 8.9–10.3)
Chloride: 105 mmol/L (ref 98–111)
Creatinine, Ser: 0.85 mg/dL (ref 0.44–1.00)
GFR, Estimated: >60 mL/min (ref >60)
Glucose, Bld: 174 mg/dL — ABNORMAL HIGH (ref 70–99)
Potassium: 3.9 mmol/L (ref 3.5–5.1)
Sodium: 137 mmol/L (ref 135–145)

## 2020-05-19 LAB — CBC WITH DIFFERENTIAL/PLATELET
Abs Immature Granulocytes: 0.07 10*3/uL (ref 0.00–0.07)
Basophils Absolute: 0 10*3/uL (ref 0.0–0.1)
Basophils Relative: 0 %
Eosinophils Absolute: 0 10*3/uL (ref 0.0–0.5)
Eosinophils Relative: 0 %
HCT: 40.9 % (ref 36.0–46.0)
Hemoglobin: 13.8 g/dL (ref 12.0–15.0)
Immature Granulocytes: 1 %
Lymphocytes Relative: 5 %
Lymphs Abs: 0.6 10*3/uL — ABNORMAL LOW (ref 0.7–4.0)
MCH: 27 pg (ref 26.0–34.0)
MCHC: 33.7 g/dL (ref 30.0–36.0)
MCV: 79.9 fL — ABNORMAL LOW (ref 80.0–100.0)
Monocytes Absolute: 0.4 10*3/uL (ref 0.1–1.0)
Monocytes Relative: 3 %
Neutro Abs: 12 10*3/uL — ABNORMAL HIGH (ref 1.7–7.7)
Neutrophils Relative %: 91 %
Platelets: 281 10*3/uL (ref 150–400)
RBC: 5.12 MIL/uL — ABNORMAL HIGH (ref 3.87–5.11)
RDW: 13.5 % (ref 11.5–15.5)
WBC: 13.2 10*3/uL — ABNORMAL HIGH (ref 4.0–10.5)
nRBC: 0 % (ref 0.0–0.2)

## 2020-05-19 LAB — HIV ANTIBODY (ROUTINE TESTING W REFLEX): HIV Screen 4th Generation wRfx: NONREACTIVE

## 2020-05-19 LAB — RESP PANEL BY RT-PCR (RSV, FLU A&B, COVID)  RVPGX2
Influenza A by PCR: NEGATIVE
Influenza B by PCR: NEGATIVE
Resp Syncytial Virus by PCR: POSITIVE — AB
SARS Coronavirus 2 by RT PCR: NEGATIVE

## 2020-05-19 LAB — RESP PANEL BY RT-PCR (FLU A&B, COVID) ARPGX2
Influenza A by PCR: NEGATIVE
Influenza B by PCR: NEGATIVE
SARS Coronavirus 2 by RT PCR: NEGATIVE

## 2020-05-19 MED ORDER — ALBUTEROL (5 MG/ML) CONTINUOUS INHALATION SOLN
10.0000 mg/h | INHALATION_SOLUTION | Freq: Once | RESPIRATORY_TRACT | Status: AC
  Administered 2020-05-19: 10 mg/h via RESPIRATORY_TRACT
  Filled 2020-05-19: qty 20

## 2020-05-19 MED ORDER — ALBUTEROL SULFATE (2.5 MG/3ML) 0.083% IN NEBU
2.5000 mg | INHALATION_SOLUTION | RESPIRATORY_TRACT | Status: DC | PRN
  Administered 2020-05-19 – 2020-05-21 (×3): 2.5 mg via RESPIRATORY_TRACT
  Filled 2020-05-19 (×3): qty 3

## 2020-05-19 MED ORDER — ALBUTEROL SULFATE (2.5 MG/3ML) 0.083% IN NEBU
5.0000 mg | INHALATION_SOLUTION | Freq: Once | RESPIRATORY_TRACT | Status: AC
  Administered 2020-05-19: 5 mg via RESPIRATORY_TRACT
  Filled 2020-05-19: qty 6

## 2020-05-19 MED ORDER — IPRATROPIUM BROMIDE 0.02 % IN SOLN
RESPIRATORY_TRACT | Status: AC
  Administered 2020-05-19: 0.5 mg via RESPIRATORY_TRACT
  Filled 2020-05-19: qty 2.5

## 2020-05-19 MED ORDER — MAGNESIUM SULFATE 2 GM/50ML IV SOLN
2.0000 g | Freq: Once | INTRAVENOUS | Status: AC
  Administered 2020-05-19: 2 g via INTRAVENOUS
  Filled 2020-05-19: qty 50

## 2020-05-19 MED ORDER — ALBUTEROL SULFATE (2.5 MG/3ML) 0.083% IN NEBU
INHALATION_SOLUTION | RESPIRATORY_TRACT | Status: AC
  Administered 2020-05-19: 5 mg via RESPIRATORY_TRACT
  Filled 2020-05-19: qty 6

## 2020-05-19 MED ORDER — PREDNISONE 20 MG PO TABS
50.0000 mg | ORAL_TABLET | Freq: Every day | ORAL | Status: DC
  Administered 2020-05-20 – 2020-05-22 (×3): 50 mg via ORAL
  Filled 2020-05-19 (×2): qty 2
  Filled 2020-05-19: qty 3
  Filled 2020-05-19: qty 2

## 2020-05-19 MED ORDER — METHYLPREDNISOLONE SODIUM SUCC 125 MG IJ SOLR
60.0000 mg | Freq: Two times a day (BID) | INTRAMUSCULAR | Status: AC
  Administered 2020-05-19 (×2): 60 mg via INTRAVENOUS
  Filled 2020-05-19 (×2): qty 2

## 2020-05-19 MED ORDER — IPRATROPIUM BROMIDE 0.02 % IN SOLN
0.5000 mg | Freq: Once | RESPIRATORY_TRACT | Status: AC
  Administered 2020-05-19: 0.5 mg via RESPIRATORY_TRACT
  Filled 2020-05-19: qty 2.5

## 2020-05-19 MED ORDER — LISINOPRIL 20 MG PO TABS
20.0000 mg | ORAL_TABLET | Freq: Every day | ORAL | Status: DC
  Administered 2020-05-20 – 2020-05-25 (×6): 20 mg via ORAL
  Filled 2020-05-19 (×7): qty 1

## 2020-05-19 MED ORDER — HYDROCHLOROTHIAZIDE 25 MG PO TABS: 25.0000 mg | ORAL_TABLET | Freq: Every day | ORAL | Status: DC

## 2020-05-19 MED ORDER — DORZOLAMIDE HCL-TIMOLOL MAL 2-0.5 % OP SOLN
1.0000 [drp] | Freq: Two times a day (BID) | OPHTHALMIC | Status: DC
  Administered 2020-05-19 – 2020-05-25 (×13): 1 [drp] via OPHTHALMIC
  Filled 2020-05-19: qty 10

## 2020-05-19 MED ORDER — ENOXAPARIN SODIUM 40 MG/0.4ML ~~LOC~~ SOLN
40.0000 mg | SUBCUTANEOUS | Status: DC
  Administered 2020-05-19 – 2020-05-24 (×6): 40 mg via SUBCUTANEOUS
  Filled 2020-05-19 (×6): qty 0.4

## 2020-05-19 MED ORDER — SODIUM CHLORIDE 0.9% FLUSH
3.0000 mL | Freq: Two times a day (BID) | INTRAVENOUS | Status: DC
  Administered 2020-05-19 – 2020-05-25 (×13): 3 mL via INTRAVENOUS

## 2020-05-19 MED ORDER — IPRATROPIUM-ALBUTEROL 0.5-2.5 (3) MG/3ML IN SOLN
3.0000 mL | Freq: Four times a day (QID) | RESPIRATORY_TRACT | Status: DC
  Administered 2020-05-19 – 2020-05-25 (×25): 3 mL via RESPIRATORY_TRACT
  Filled 2020-05-19 (×25): qty 3

## 2020-05-19 MED ORDER — IPRATROPIUM BROMIDE 0.02 % IN SOLN: 0.5000 mg | Freq: Once | RESPIRATORY_TRACT | Status: AC

## 2020-05-19 MED ORDER — BENZONATATE 100 MG PO CAPS
200.0000 mg | ORAL_CAPSULE | Freq: Three times a day (TID) | ORAL | Status: DC | PRN
  Administered 2020-05-19: 200 mg via ORAL
  Filled 2020-05-19: qty 2

## 2020-05-19 MED ORDER — AMLODIPINE BESYLATE 5 MG PO TABS
5.0000 mg | ORAL_TABLET | Freq: Every day | ORAL | Status: DC
  Administered 2020-05-20 – 2020-05-25 (×6): 5 mg via ORAL
  Filled 2020-05-19 (×7): qty 1

## 2020-05-19 MED ORDER — HYDROCHLOROTHIAZIDE 25 MG PO TABS
25.0000 mg | ORAL_TABLET | Freq: Every day | ORAL | Status: DC
  Administered 2020-05-19 – 2020-05-25 (×7): 25 mg via ORAL
  Filled 2020-05-19 (×9): qty 1

## 2020-05-19 MED ORDER — ALBUTEROL SULFATE (2.5 MG/3ML) 0.083% IN NEBU: 5.0000 mg | INHALATION_SOLUTION | Freq: Once | RESPIRATORY_TRACT | Status: AC

## 2020-05-19 MED ORDER — AMLODIPINE BESYLATE 5 MG PO TABS
5.0000 mg | ORAL_TABLET | Freq: Once | ORAL | Status: AC
  Administered 2020-05-19: 5 mg via ORAL
  Filled 2020-05-19: qty 1

## 2020-05-19 MED ORDER — BRIMONIDINE TARTRATE 0.2 % OP SOLN
1.0000 [drp] | Freq: Three times a day (TID) | OPHTHALMIC | Status: DC
  Administered 2020-05-19 – 2020-05-25 (×18): 1 [drp] via OPHTHALMIC
  Filled 2020-05-19: qty 5

## 2020-05-19 MED ORDER — BUDESONIDE 0.5 MG/2ML IN SUSP: 0.5000 mg | Freq: Two times a day (BID) | RESPIRATORY_TRACT | Status: DC | PRN

## 2020-05-19 MED ORDER — DM-GUAIFENESIN ER 30-600 MG PO TB12
1.0000 | ORAL_TABLET | Freq: Two times a day (BID) | ORAL | Status: DC
  Administered 2020-05-19 – 2020-05-25 (×13): 1 via ORAL
  Filled 2020-05-19 (×14): qty 1

## 2020-05-19 MED ORDER — ACETAZOLAMIDE 250 MG PO TABS
500.0000 mg | ORAL_TABLET | Freq: Two times a day (BID) | ORAL | Status: DC
Start: 2020-05-19 — End: 2020-05-25
  Administered 2020-05-19 – 2020-05-25 (×12): 500 mg via ORAL
  Filled 2020-05-19 (×14): qty 2

## 2020-05-19 MED ORDER — METHYLPREDNISOLONE SODIUM SUCC 125 MG IJ SOLR
60.0000 mg | Freq: Once | INTRAMUSCULAR | Status: AC
  Administered 2020-05-19: 60 mg via INTRAVENOUS
  Filled 2020-05-19: qty 2

## 2020-05-19 MED ORDER — HYDRALAZINE HCL 20 MG/ML IJ SOLN: 5.0000 mg | INTRAMUSCULAR | Status: DC | PRN

## 2020-05-19 MED ORDER — ALBUTEROL SULFATE HFA 108 (90 BASE) MCG/ACT IN AERS: 2.0000 | INHALATION_SPRAY | RESPIRATORY_TRACT | Status: DC | PRN

## 2020-05-19 MED ORDER — PREDNISOLONE ACETATE 1 % OP SUSP: 1.0000 [drp] | OPHTHALMIC | Status: DC

## 2020-05-19 MED ORDER — ATROPINE SULFATE 1 % OP SOLN
1.0000 [drp] | Freq: Two times a day (BID) | OPHTHALMIC | Status: DC
  Administered 2020-05-19 – 2020-05-20 (×2): 1 [drp] via OPHTHALMIC
  Filled 2020-05-19: qty 2

## 2020-05-19 MED ORDER — LATANOPROST 0.005 % OP SOLN
1.0000 [drp] | Freq: Every day | OPHTHALMIC | Status: DC
  Administered 2020-05-19 – 2020-05-24 (×6): 1 [drp] via OPHTHALMIC
  Filled 2020-05-19: qty 2.5

## 2020-05-19 MED ORDER — LISINOPRIL 20 MG PO TABS
20.0000 mg | ORAL_TABLET | Freq: Once | ORAL | Status: AC
  Administered 2020-05-19: 20 mg via ORAL
  Filled 2020-05-19: qty 1

## 2020-05-19 MED ORDER — AZATHIOPRINE 50 MG PO TABS
100.0000 mg | ORAL_TABLET | Freq: Two times a day (BID) | ORAL | Status: DC
  Administered 2020-05-19 – 2020-05-25 (×12): 100 mg via ORAL
  Filled 2020-05-19 (×14): qty 2

## 2020-05-19 MED ORDER — LACTATED RINGERS IV SOLN: INTRAVENOUS | Status: DC

## 2020-05-19 MED ORDER — FLUTICASONE PROPIONATE HFA 110 MCG/ACT IN AERO
2.0000 | INHALATION_SPRAY | Freq: Two times a day (BID) | RESPIRATORY_TRACT | Status: DC | PRN
  Filled 2020-05-19: qty 12

## 2020-05-19 MED ORDER — PREDNISOLONE ACETATE 1 % OP SUSP
1.0000 [drp] | OPHTHALMIC | Status: DC
  Administered 2020-05-19 – 2020-05-25 (×48): 1 [drp] via OPHTHALMIC
  Filled 2020-05-19: qty 5

## 2020-05-19 NOTE — ED Notes (Addendum)
Changed into hospital gown, provided with diaper. Pt currently on O2 at 2lpm via [place] for comfort, satting at 97%.

## 2020-05-19 NOTE — ED Notes (Signed)
Pt able to ambulate with steady gait - O2 sat 92% on room air however, pt noted to be tachypneic, provider made aware.

## 2020-05-19 NOTE — ED Provider Notes (Signed)
[facility] EMERGENCY DEPARTMENT Provider Note   CSN: [id number] Arrival date & time: 05/18/20  1400     History Chief Complaint  Patient presents with  . Shortness of Breath    Monica Estrada is a 55 y.o. female with a history of HTN, autoimmune thyroiditis, reactive airway disease who presents the emergency department with a chief complaint of shortness of breath.  The patient reports waxing and waning shortness of breath over the last 4 days accompanied by a nonproductive cough and wheezing.  She reports pain in her ribs with coughing, but pain resolves after coughing subsides.  No fever, chills, loss of sense of taste or smell, sore throat, abdominal pain, nausea, vomiting, diarrhea, headache.  She was seen yesterday at urgent care and had a negative COVID-19 test and was discharged with a burst of prednisone.  She has been using her home albuterol inhaler and albuterol nebulizer with intermittent relief in her symptoms.  Reports that symptoms will significantly improve for about 3 hours before worsening.  Earlier today, she used her home albuterol nebulizer machine twice, but shortness of breath continued to worsen so she came to the ER for further evaluation.  Reports that she took her first dose of the 50 mg of prednisone earlier today.  No known sick contacts.  She is fully vaccinated against COVID-19.  States that she has a history of being diagnosed with bronchitis approximately twice per year.  Her symptoms today feel similar to previous episodes.  She is a never smoker.  She has not taken her nighttime dose of her antihypertensive medications due to being in the ER all day.  The history is provided by the patient and medical records. No language interpreter was used.       Past Medical History:  Diagnosis Date  . Abnormal Pap smear   . Colonic edema    mild left- ed eval abd ct  . Gave birth to child recently    2012  . History of hypothyroidism 1999    of post partum  . Hx of abnormal cervical Pap smear   . Hypertension   . Uveitis    stable  . Wheezing 04/01/2015    Patient Active Problem List   Diagnosis Date Noted  . Exacerbation of reactive airway disease 05/19/2020  . Wheezing 04/01/2015  . Essential hypertension 04/17/2014  . TSH elevation 04/17/2014  . Medication management 04/17/2014  . History of hypokalemia 11/26/2012  . Tingling 11/26/2012  . BMI 32.0-32.9,adult 01/08/2012  . Umbilical hernia  possible from lap site  12/05/2011  . Preventative health care 11/12/2010  . Uveitis   . History of hypothyroidism   . NONSPECIFIC ABN FINDING RAD & OTH EXAM GI TRACT 11/25/2009  . Anemia 11/03/2009  . AUTOIMMUNE THYROIDITIS 11/20/2008  . UVEITIS 12/12/2006  . Hyperlipemia 12/11/2006    Past Surgical History:  Procedure Laterality Date  . CHOLECYSTECTOMY     1999  . TUBAL LIGATION  12/31/2010   Procedure: BILATERAL TUBAL LIGATION;  Surgeon: Leighton Roach Meisinger;  Location: [facility];  Service: Gynecology;  Laterality: Bilateral;  Bilateral post partum tubal ligation     OB History    Gravida  2   Para  2   Term  2   Preterm  0   AB  0   Living  2     SAB  0   IAB  0   Ectopic  0   Multiple  0  Live Births  1           Family History  Problem Relation Age of Onset  . Hypertension Mother   . Diabetes Mother   . Thyroid disease Mother   . Diabetes Father   . Hypertension Father   . Lupus Sister   . Rheum arthritis Sister   . Other Sister        lamb disease  . Diabetes Brother   . Diabetes Son   . Stroke Sister     Social History   Tobacco Use  . Smoking status: Never Smoker  . Smokeless tobacco: Never Used  Substance Use Topics  . Alcohol use: No  . Drug use: No    Home Medications Prior to Admission medications   Medication Sig Start Date End Date Taking? Authorizing Provider  albuterol (VENTOLIN HFA) 108 (90 Base) MCG/ACT inhaler Inhale 2 puffs into the lungs every 4 (four)  hours as needed for wheezing or shortness of breath. 06/30/19   Scot Jun, FNP  amLODipine (NORVASC) 5 MG tablet Take 1 tablet (5 mg total) by mouth daily. 03/10/20   Panosh, Standley Brooking, MD  atropine 1 % ophthalmic solution Place 1 drop into the right eye 2 (two) times daily.    [provider]  benzonatate (TESSALON) 200 MG capsule Take 1 capsule (200 mg total) by mouth 3 (three) times daily as needed for up to 7 days for cough. 05/17/20 05/24/20  Wieters, Hallie C, PA-C  dextromethorphan-guaiFENesin (MUCINEX DM) 30-600 MG 12hr tablet Take 1 tablet by mouth 2 (two) times daily. 05/17/20   Wieters, Hallie C, PA-C  ferrous sulfate 325 (65 FE) MG tablet Take 650 mg by mouth daily with breakfast.    [provider]  fluticasone (FLOVENT HFA) 110 MCG/ACT inhaler Inhale 2 puffs into the lungs 2 (two) times daily as needed. 06/30/19   Scot Jun, FNP  ipratropium-albuterol (DUONEB) 0.5-2.5 (3) MG/3ML SOLN Take 3 mLs by nebulization every 4 (four) hours as needed. 05/17/20   Wieters, Hallie C, PA-C  lisinopril-hydrochlorothiazide (ZESTORETIC) 20-25 MG tablet Take 1 tablet by mouth daily. 03/10/20   Panosh, Standley Brooking, MD  predniSONE (DELTASONE) 50 MG tablet Take 1 tablet (50 mg total) by mouth daily with breakfast for 5 days. 05/17/20 05/22/20  Wieters, Hallie C, PA-C    Allergies    Patient has no known allergies.  Review of Systems   Review of Systems  Constitutional: Negative for activity change, chills and fever.  HENT: Negative for congestion, sore throat and voice change.   Eyes: Negative for visual disturbance.  Respiratory: Positive for cough, shortness of breath and wheezing.   Cardiovascular: Negative for chest pain and palpitations.  Gastrointestinal: Negative for abdominal pain, constipation, diarrhea, nausea and vomiting.  Genitourinary: Negative for dysuria and frequency.  Musculoskeletal: Negative for back pain, joint swelling, myalgias, neck pain and neck stiffness.   Skin: Negative for rash.  Allergic/Immunologic: Negative for immunocompromised state.  Neurological: Negative for dizziness, seizures, syncope, weakness, numbness and headaches.  Psychiatric/Behavioral: Negative for confusion.    Physical Exam Updated Vital Signs BP (!) 128/91   Pulse (!) 112   Temp 97.6 F (36.4 C) (Oral)   Resp (!) 21   Ht 5\' 4"  (1.626 m)   Wt 86.2 kg   SpO2 99%   BMI 32.61 kg/m   Physical Exam Vitals and nursing note reviewed.  Constitutional:      General: She is in acute distress.  Appearance: She is not ill-appearing or diaphoretic.  HENT:     Head: Normocephalic.     Mouth/Throat:     Mouth: Mucous membranes are moist.     Pharynx: No oropharyngeal exudate or posterior oropharyngeal erythema.  Eyes:     Conjunctiva/sclera: Conjunctivae normal.  Cardiovascular:     Rate and Rhythm: Regular rhythm. Tachycardia present.     Pulses: Normal pulses.     Heart sounds: Normal heart sounds. No murmur heard. No friction rub. No gallop.   Pulmonary:     Effort: No respiratory distress.     Breath sounds: No stridor. Wheezing present. No rales.     Comments: Mild tachypnea with substernal retractions and inspiratory and expiratory wheezes in all fields.  No intercostal or suprasternal retractions. Abdominal:     General: There is no distension.     Palpations: Abdomen is soft. There is no mass.     Tenderness: There is no abdominal tenderness. There is no right CVA tenderness, left CVA tenderness, guarding or rebound.     Hernia: No hernia is present.  Musculoskeletal:     Cervical back: Neck supple.     Right lower leg: No edema.     Left lower leg: No edema.  Skin:    General: Skin is warm.     Findings: No rash.  Neurological:     Mental Status: She is alert.     Comments: Alert and oriented x4  Psychiatric:        Behavior: Behavior normal.     ED Results / Procedures / Treatments   Labs (all labs ordered are listed, but only abnormal  results are displayed) Labs Reviewed  RESP PANEL BY RT-PCR (RSV, FLU A&B, COVID)  RVPGX2 - Abnormal; Notable for the following components:      Result Value   Resp Syncytial Virus by PCR POSITIVE (*)    All other components within normal limits  CBC WITH DIFFERENTIAL/PLATELET - Abnormal; Notable for the following components:   WBC 13.2 (*)    RBC 5.12 (*)    MCV 79.9 (*)    Neutro Abs 12.0 (*)    Lymphs Abs 0.6 (*)    All other components within normal limits  BASIC METABOLIC PANEL - Abnormal; Notable for the following components:   CO2 19 (*)    Glucose, Bld 174 (*)    BUN 22 (*)    All other components within normal limits  RESP PANEL BY RT-PCR (FLU A&B, COVID) ARPGX2    EKG None  Radiology DG Chest 2 View  Result Date: 05/19/2020 CLINICAL DATA:  Shortness of breath. EXAM: CHEST - 2 VIEW COMPARISON:  05/17/2020. FINDINGS: Mediastinum and hilar structures normal. Heart size normal. Low lung volumes. No pleural effusion or pneumothorax. Thoracic spine scoliosis. No acute bony abnormality. IMPRESSION: Low lung volumes.  No acute cardiopulmonary disease identified. Electronically Signed   By: Marcello Moores  Register   On: 05/19/2020 05:35   DG Chest 2 View  Result Date: 05/17/2020 CLINICAL DATA:  Wheezing, cough, and shortness of breath for 4 days, O2 saturation 92%, history hypertension EXAM: CHEST - 2 VIEW COMPARISON:  03/15/2020 FINDINGS: Normal heart size, mediastinal contours, and pulmonary vascularity. Lungs clear. No infiltrate, pleural effusion, or pneumothorax. Biconvex thoracic scoliosis. IMPRESSION: No acute abnormalities. Electronically Signed   By: Lavonia Dana M.D.   On: 05/17/2020 10:44    Procedures Procedures (including critical care time)  Medications Ordered in ED Medications  hydrochlorothiazide (HYDRODIURIL) tablet 25  mg (has no administration in time range)  albuterol (PROVENTIL) (2.5 MG/3ML) 0.083% nebulizer solution 5 mg (5 mg Nebulization Given 05/18/20 1543)   albuterol (PROVENTIL) (2.5 MG/3ML) 0.083% nebulizer solution 5 mg (5 mg Nebulization Given 05/19/20 0159)  ipratropium (ATROVENT) nebulizer solution 0.5 mg (0.5 mg Nebulization Given 05/19/20 0159)  methylPREDNISolone sodium succinate (SOLU-MEDROL) 125 mg/2 mL injection 60 mg (60 mg Intravenous Given 05/19/20 0159)  lisinopril (ZESTRIL) tablet 20 mg (20 mg Oral Given 05/19/20 0154)  amLODipine (NORVASC) tablet 5 mg (5 mg Oral Given 05/19/20 0154)  ipratropium (ATROVENT) nebulizer solution 0.5 mg (0.5 mg Nebulization Given 05/19/20 0233)  albuterol (PROVENTIL) (2.5 MG/3ML) 0.083% nebulizer solution 5 mg (5 mg Nebulization Given 05/19/20 0233)  albuterol (PROVENTIL,VENTOLIN) solution continuous neb (10 mg/hr Nebulization Given 05/19/20 0300)  magnesium sulfate IVPB 2 g 50 mL (0 g Intravenous Stopped 05/19/20 0406)    ED Course  I have reviewed the triage vital signs and the nursing notes.  Pertinent labs & imaging results that were available during my care of the patient were reviewed by me and considered in my medical decision making (see chart for details).    MDM Rules/Calculators/A&P                          55 year old female with a history of HTN, autoimmune thyroiditis, reactive airway disease who presents the emergency department with 3 days of worsening cough, shortness of breath, and wheezing despite initiating 50 mg of prednisone at home earlier today and using her home albuterol inhaler and albuterol nebulizer.  States that she has been told she previously has bronchitis, but has never been been formally diagnosed with asthma or reactive airway disease.  She is a never smoker.  Mildly tachycardic on arrival.  She is tachypneic.  Afebrile.  Initially, oxygen saturation was 91% on room air.  On exam, she has diffuse inspiratory and expiratory wheezes in all fields with substernal retractions and tachypnea.  Patient was given DuoNeb x2 and 60 mg of Solu-Medrol with no improvement in her respiratory  status.  She was ambulated after second DuoNeb and was markedly tachypneic with respiration rate 30-40, but did not have any hypoxia at that time, after walking approximately 30 to 40 feet.  Spoke with respiratory and patient was then started on a continuous nebulizer and given 2 g of magnesium sulfate.  Following continuous nebulizer, patient minimal improvement was seen in patient's respiratory status.  Labs and imaging have been reviewed and independently interpreted by me.  Chest x-ray with low lung volumes.  No obvious hyperinflation or infiltrates.  Bicarb is 19, likely secondary to respiratory acidosis.  Patient has been mentating appropriately and is remained alert and oriented x4.  She does have a mild leukocytosis of 13, but this is likely reactive secondary to corticosteroid use.  Labs are otherwise unremarkable.  COVID-19 test is negative.  Patient tested positive for RSV.  Per chart review, it does appear that the patient has a history of wheezing or reactive airway disease associated with viral illness, which I suspect is contributing to her presentation today.  I also consider the following causes of shortness of breath including ACS, PE, bacterial pneumonia, acute heart failure, but I am less suspicious that these are the etiology of the patient's symptoms.  Given that the patient has not had any improvement in her respiratory status, she will require admission for further work-up and evaluation.  Spoke with the hospitalist team and  Dr. Tawny Hopping will accept the patient for admission.  The patient appears reasonably stabilized for admission considering the current resources, flow, and capabilities available in the ED at this time, and I doubt any other [facility] requiring further screening and/or treatment in the ED prior to admission.   Final Clinical Impression(s) / ED Diagnoses Final diagnoses:  RSV infection  Exacerbation of reactive airway disease, unspecified asthma severity,  unspecified whether persistent    Rx / DC Orders ED Discharge Orders    None       Joanne Gavel, PA-C 05/19/20 [id number]    Merryl Hacker, MD 05/20/20 0120

## 2020-05-19 NOTE — ED Notes (Signed)
Patient transported to X-ray 

## 2020-05-19 NOTE — ED Notes (Signed)
Back to room.

## 2020-05-19 NOTE — H&P (Signed)
History and Physical    Monica Estrada [id number] DOB: 02/22/1966 DOA: 05/18/2020  PCP: Burnis Medin, MD Consultants:  Manuella Ghazi - ophthalmologist; Rolla Etienne - OB/GYN Patient coming from:  Home - lives with son (23) and daughter (32); NOK: Niece, Matlyn Scruton, [phone]; Bartholomew Boards [place], [phone]  Chief Complaint: Respiratory distress  HPI: Monica Estrada is a 55 y.o. female with medical history significant of reactive airway disease; HTN; uveitis; and postpartum hypothyroidism presenting with respiratory distress.  She started feeling SOB.  Mild urinary incontinence with cough.  She started using her neb treatments.  She went ot Urgent Care on Monday and she was given Albuterol and a steroid shot and told to come to the ER if not getting better.  But it got worse.  She has h/o bronchitis 1-2 times a year but no known h/o asthma/COPD.  She was never a smoker.  No h/o childhood asthma.  Not on home O2.  No fever.    ED Course:  Carryover, per Dr. Cyd Silence:  55 year old female with past medical history of hypertension and "wheezing" without definitive diagnoses of COPD or asthma in the past. Patient now presenting in respiratory distress with severe wheezing on evaluation. Patient is on room air however. Covid negative. Patient has been given a 1 hour nebulized treatment and is somewhat improved but still extremely short of breath with minimal exertion. ER provider is asking for hospitalization for continued nebulized treatments. Patient has mild leukocytosis which is thought to be secondary to prednisone use initiated by an urgent care clinic 1 to 2 days ago. Additional steroids given here in the emergency room. Sounds like patient likely has undiagnosed asthma or reactive airway disease and this is an exacerbation. Bed request placed for a Med tele bed.  Review of Systems: As per HPI; otherwise review of systems reviewed and negative.   Ambulatory Status:  Ambulates  without assistance  COVID Vaccine Status:   Complete - has not had booster but would like to receive it while here  Past Medical History:  Diagnosis Date  . Abnormal Pap smear   . Colonic edema    mild left- ed eval abd ct  . Gave birth to child recently    2012  . History of hypothyroidism 1999   of post partum  . Hx of abnormal cervical Pap smear   . Hypertension   . Uveitis    stable  . Wheezing 04/01/2015    Past Surgical History:  Procedure Laterality Date  . CHOLECYSTECTOMY     1999  . TUBAL LIGATION  12/31/2010   Procedure: BILATERAL TUBAL LIGATION;  Surgeon: Blane Ohara Meisinger;  Location: [facility];  Service: Gynecology;  Laterality: Bilateral;  Bilateral post partum tubal ligation    Social History   Socioeconomic History  . Marital status: Single    Spouse name: Not on file  . Number of children: Not on file  . Years of education: Not on file  . Highest education level: Not on file  Occupational History  . Occupation: retail  Tobacco Use  . Smoking status: Never Smoker  . Smokeless tobacco: Never Used  Substance and Sexual Activity  . Alcohol use: No  . Drug use: No  . Sexual activity: Not Currently  Other Topics Concern  . Not on file  Social History Narrative   [name]  ABOUT 49 HOURS    Single   HH of 3 son  33 y  And baby girl 2 year  Father  out of country comes back ocass. Some financial support .    Father  In [place].   No pets.    Neg ets .    2 jobs  hh of 2  39 yo and older teen son college                Social Determinants of Corporate investment banker Strain: Not on file  Food Insecurity: Not on file  Transportation Needs: Not on file  Physical Activity: Not on file  Stress: Not on file  Social Connections: Not on file  Intimate Partner Violence: Not on file    No Known Allergies  Family History  Problem Relation Age of Onset  . Hypertension Mother   . Diabetes Mother   . Thyroid disease Mother   . Diabetes Father   .  Hypertension Father   . Lupus Sister   . Rheum arthritis Sister   . Other Sister        lamb disease  . Diabetes Brother   . Diabetes Son   . Stroke Sister     Prior to Admission medications   Medication Sig Start Date End Date Taking? Authorizing Provider  acetaZOLAMIDE (DIAMOX) 250 MG tablet Take 500 mg by mouth in the morning and at bedtime. 05/04/20  Yes [provider]  albuterol (VENTOLIN HFA) 108 (90 Base) MCG/ACT inhaler Inhale 2 puffs into the lungs every 4 (four) hours as needed for wheezing or shortness of breath. 06/30/19  Yes Bing Neighbors, FNP  amLODipine (NORVASC) 5 MG tablet Take 1 tablet (5 mg total) by mouth daily. 03/10/20  Yes Panosh, Neta Mends, MD  atropine 1 % ophthalmic solution Place 1 drop into the right eye 2 (two) times daily.   Yes [provider]  azaTHIOprine (IMURAN) 50 MG tablet Take 100 mg by mouth in the morning and at bedtime. 05/04/20  Yes [provider]  benzonatate (TESSALON) 200 MG capsule Take 1 capsule (200 mg total) by mouth 3 (three) times daily as needed for up to 7 days for cough. 05/17/20 05/24/20 Yes Wieters, Hallie C, PA-C  brimonidine (ALPHAGAN) 0.2 % ophthalmic solution Place 1 drop into both eyes every 8 (eight) hours. 05/13/20  Yes [provider]  dextromethorphan-guaiFENesin (MUCINEX DM) 30-600 MG 12hr tablet Take 1 tablet by mouth 2 (two) times daily. 05/17/20  Yes Wieters, Hallie C, PA-C  dorzolamide-timolol (COSOPT) 22.3-6.8 MG/ML ophthalmic solution Place 1 drop into both eyes in the morning and at bedtime. 05/01/20  Yes [provider]  ferrous sulfate 325 (65 FE) MG tablet Take 650 mg by mouth daily with breakfast.   Yes [provider]  fluticasone (FLOVENT HFA) 110 MCG/ACT inhaler Inhale 2 puffs into the lungs 2 (two) times daily as needed. Patient taking differently: Inhale 2 puffs into the lungs 2 (two) times daily as needed (shortness of breath/wheezing). 06/30/19  Yes Bing Neighbors, FNP  ipratropium-albuterol (DUONEB) 0.5-2.5 (3) MG/3ML SOLN Take 3 mLs by nebulization every 4 (four) hours as needed. Patient taking differently: Take 3 mLs by nebulization every 4 (four) hours as needed (shortness of breath/wheezing). 05/17/20  Yes Wieters, Hallie C, PA-C  latanoprost (XALATAN) 0.005 % ophthalmic solution Place 1 drop into both eyes at bedtime. 03/22/20  Yes [provider]  lisinopril-hydrochlorothiazide (ZESTORETIC) 20-25 MG tablet Take 1 tablet by mouth daily. 03/10/20  Yes Panosh, Neta Mends, MD  nystatin ointment (MYCOSTATIN) Apply 1 application topically in the morning and at bedtime.  05/12/20  Yes [provider]  prednisoLONE acetate (PRED FORTE) 1 % ophthalmic suspension Place 1 drop into the left eye every 2 (two) hours. When awake. 04/11/20  Yes [provider]  predniSONE (DELTASONE) 50 MG tablet Take 1 tablet (50 mg total) by mouth daily with breakfast for 5 days. 05/17/20 05/22/20 Yes Wieters, Hallie C, PA-C  triamcinolone ointment (KENALOG) 0.1 % Apply 1 application topically in the morning and at bedtime. 05/12/20  Yes [provider]    Physical Exam: Vitals:   05/19/20 0605 05/19/20 0819 05/19/20 0900 05/19/20 0915  BP:   (!) 170/88 (!) 164/76  Pulse:   (!) 109 (!) 108  Resp:   17 20  Temp:  98 F (36.7 C)    TempSrc:  Oral    SpO2: 97%  99% 99%  Weight:      Height:         . General:  Appears calm and comfortable and is in NAD . Eyes:  PERRL, EOMI, normal lids, iris . ENT:  grossly normal hearing, lips & tongue, mmm . Neck:  no LAD, masses or thyromegaly . Cardiovascular:  RR with mild persistent tachycardia, no m/r/g. No LE edema.  Marland Kitchen Respiratory:   Diffuse rales with mild wheezing.  Mildly increased respiratory effort, on [place] O2 (2L) . Abdomen:  soft, NT, ND, NABS . Back:   normal alignment, no CVAT . Skin:  no rash or induration seen on limited exam . Musculoskeletal:  grossly normal tone BUE/BLE, good  ROM, no bony abnormality . Psychiatric:  grossly normal mood and affect, speech fluent and appropriate, AOx3 . Neurologic:  CN 2-12 grossly intact, moves all extremities in coordinated fashion    Radiological Exams on Admission: Independently reviewed - see discussion in A/P where applicable  DG Chest 2 View  Result Date: 05/19/2020 CLINICAL DATA:  Shortness of breath. EXAM: CHEST - 2 VIEW COMPARISON:  05/17/2020. FINDINGS: Mediastinum and hilar structures normal. Heart size normal. Low lung volumes. No pleural effusion or pneumothorax. Thoracic spine scoliosis. No acute bony abnormality. IMPRESSION: Low lung volumes.  No acute cardiopulmonary disease identified. Electronically Signed   By: Marcello Moores  Register   On: 05/19/2020 05:35    EKG: Independently reviewed.  Sinus tachycardia with rate 101; prolonged QTc 508; no evidence of acute ischemia   Labs on Admission: I have personally reviewed the available labs and imaging studies at the time of the admission.  Pertinent labs:   CO2 19 Glucose 174 BUN 22/Creatinine 0.85/GFR >60 WBC 13.2 COVID/flu negative RSV POSITIVE   Assessment/Plan Principal Problem:   RSV/bronchiolitis Active Problems:   Hyperlipemia   UVEITIS   BMI 32.0-32.9,adult   Essential hypertension   Acute respiratory failure associated with RSV infection -Patient presented in respiratory distress, improved with continuous neb -O2 sats as low as 91% without h/o chronic lung disease or smoking history -She was found to be RSV + and this is likely the source of her symptoms -She does have a neb machine at home from prior h/o bronchitis x 2, no underlying h/o asthma -She does not have fever or leukocytosis.  -Chest x-ray is not consistent with pneumonia -She was given a continuous neb treatment in the ED with some improvement.   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  -Weight loss should be encouraged -Outpatient PCP/bariatric medicine/bariatric surgery f/u encouraged    Note: This patient has been tested and is negative for the novel coronavirus COVID-19. She has been fully vaccinated against COVID-19.    DVT prophylaxis: Lovenox  Code Status:  Full - confirmed with patient Family Communication: None present Disposition Plan:  The patient is from: home  Anticipated d/c is to: home without [facility] services once her respiratory issues have been resolved.  Anticipated d/c date will depend on clinical response to treatment, but possibly as early as tomorrow if she has excellent response to treatment  Patient is currently: acutely ill Consults called: Nutrition/RT  Admission status: It is my clinical opinion that referral for OBSERVATION is reasonable and necessary in this patient based on the above information provided. The aforementioned taken together are felt to place the patient at high risk for further clinical deterioration. However it is anticipated that the patient may be medically stable for discharge from the hospital within 24 to 48 hours.    Karmen Bongo MD [facility]   How to contact the [facility] Attending or Consulting provider [place] or covering provider during after hours [place], for this patient?  1. Check the care team in [facility] and look for a) attending/consulting [facility] provider listed and b) the [facility] team listed 2. Log into [email or web address] and use [place]'s universal password to access. If you do not have the password, please contact the hospital operator. 3. Locate the [facility] provider you are looking for under [facility] and page to a number that you can be directly reached. 4. If you still have difficulty reaching the provider, please page the [facility] (Director on Call) for the Hospitalists listed on amion for assistance.   05/19/2020, 10:56 AM

## 2020-05-20 DIAGNOSIS — Z823 Family history of stroke: Secondary | ICD-10-CM | POA: Diagnosis not present

## 2020-05-20 DIAGNOSIS — E063 Autoimmune thyroiditis: Secondary | ICD-10-CM | POA: Diagnosis present

## 2020-05-20 DIAGNOSIS — H209 Unspecified iridocyclitis: Secondary | ICD-10-CM | POA: Diagnosis present

## 2020-05-20 DIAGNOSIS — I10 Essential (primary) hypertension: Secondary | ICD-10-CM | POA: Diagnosis present

## 2020-05-20 DIAGNOSIS — Z833 Family history of diabetes mellitus: Secondary | ICD-10-CM | POA: Diagnosis not present

## 2020-05-20 DIAGNOSIS — E669 Obesity, unspecified: Secondary | ICD-10-CM | POA: Diagnosis present

## 2020-05-20 DIAGNOSIS — Z6832 Body mass index (BMI) 32.0-32.9, adult: Secondary | ICD-10-CM | POA: Diagnosis not present

## 2020-05-20 DIAGNOSIS — Y92239 Unspecified place in hospital as the place of occurrence of the external cause: Secondary | ICD-10-CM | POA: Diagnosis present

## 2020-05-20 DIAGNOSIS — Z9221 Personal history of antineoplastic chemotherapy: Secondary | ICD-10-CM | POA: Diagnosis not present

## 2020-05-20 DIAGNOSIS — Z23 Encounter for immunization: Secondary | ICD-10-CM | POA: Diagnosis not present

## 2020-05-20 DIAGNOSIS — Z8261 Family history of arthritis: Secondary | ICD-10-CM | POA: Diagnosis not present

## 2020-05-20 DIAGNOSIS — J9601 Acute respiratory failure with hypoxia: Secondary | ICD-10-CM | POA: Diagnosis present

## 2020-05-20 DIAGNOSIS — J210 Acute bronchiolitis due to respiratory syncytial virus: Secondary | ICD-10-CM | POA: Diagnosis present

## 2020-05-20 DIAGNOSIS — D72829 Elevated white blood cell count, unspecified: Secondary | ICD-10-CM | POA: Diagnosis present

## 2020-05-20 DIAGNOSIS — Z8349 Family history of other endocrine, nutritional and metabolic diseases: Secondary | ICD-10-CM | POA: Diagnosis not present

## 2020-05-20 DIAGNOSIS — T380X5A Adverse effect of glucocorticoids and synthetic analogues, initial encounter: Secondary | ICD-10-CM | POA: Diagnosis present

## 2020-05-20 DIAGNOSIS — Z20822 Contact with and (suspected) exposure to covid-19: Secondary | ICD-10-CM | POA: Diagnosis present

## 2020-05-20 DIAGNOSIS — Z7951 Long term (current) use of inhaled steroids: Secondary | ICD-10-CM | POA: Diagnosis not present

## 2020-05-20 DIAGNOSIS — Z8249 Family history of ischemic heart disease and other diseases of the circulatory system: Secondary | ICD-10-CM | POA: Diagnosis not present

## 2020-05-20 DIAGNOSIS — E876 Hypokalemia: Secondary | ICD-10-CM | POA: Diagnosis present

## 2020-05-20 DIAGNOSIS — Z832 Family history of diseases of the blood and blood-forming organs and certain disorders involving the immune mechanism: Secondary | ICD-10-CM | POA: Diagnosis not present

## 2020-05-20 DIAGNOSIS — Z79899 Other long term (current) drug therapy: Secondary | ICD-10-CM | POA: Diagnosis not present

## 2020-05-20 DIAGNOSIS — E785 Hyperlipidemia, unspecified: Secondary | ICD-10-CM | POA: Diagnosis present

## 2020-05-20 LAB — BASIC METABOLIC PANEL
Anion gap: 10 (ref 5–15)
BUN: 17 mg/dL (ref 6–20)
CO2: 23 mmol/L (ref 22–32)
Calcium: 9.4 mg/dL (ref 8.9–10.3)
Chloride: 104 mmol/L (ref 98–111)
Creatinine, Ser: 0.74 mg/dL (ref 0.44–1.00)
GFR, Estimated: >60 mL/min (ref >60)
Glucose, Bld: 161 mg/dL — ABNORMAL HIGH (ref 70–99)
Potassium: 3.2 mmol/L — ABNORMAL LOW (ref 3.5–5.1)
Sodium: 137 mmol/L (ref 135–145)

## 2020-05-20 LAB — CBC
HCT: 36.8 % (ref 36.0–46.0)
Hemoglobin: 12 g/dL (ref 12.0–15.0)
MCH: 26.1 pg (ref 26.0–34.0)
MCHC: 32.6 g/dL (ref 30.0–36.0)
MCV: 80 fL (ref 80.0–100.0)
Platelets: 249 K/uL (ref 150–400)
RBC: 4.6 MIL/uL (ref 3.87–5.11)
RDW: 13.5 % (ref 11.5–15.5)
WBC: 9.9 K/uL (ref 4.0–10.5)
nRBC: 0 % (ref 0.0–0.2)

## 2020-05-20 MED ORDER — ADULT MULTIVITAMIN W/MINERALS CH
1.0000 | ORAL_TABLET | Freq: Every day | ORAL | Status: DC
  Administered 2020-05-20 – 2020-05-25 (×6): 1 via ORAL
  Filled 2020-05-20 (×6): qty 1

## 2020-05-20 MED ORDER — POTASSIUM CHLORIDE CRYS ER 20 MEQ PO TBCR
40.0000 meq | EXTENDED_RELEASE_TABLET | ORAL | Status: AC
  Administered 2020-05-20 (×2): 40 meq via ORAL
  Filled 2020-05-20 (×2): qty 2

## 2020-05-20 MED ORDER — BUDESONIDE 0.5 MG/2ML IN SUSP
0.5000 mg | Freq: Two times a day (BID) | RESPIRATORY_TRACT | Status: DC
  Administered 2020-05-20 – 2020-05-25 (×10): 0.5 mg via RESPIRATORY_TRACT
  Filled 2020-05-20 (×10): qty 2

## 2020-05-20 NOTE — Progress Notes (Signed)
SATURATION QUALIFICATIONS: (This note is used to comply with regulatory documentation for home oxygen)  Patient Saturations on Room Air at Rest = 95%  Patient Saturations on Room Air while Ambulating = 92%  Patient Saturations on 0 Liters of oxygen while Ambulating = 92%  Please briefly explain why patient needs home oxygen:NA but pt has SOB with wheezing.

## 2020-05-20 NOTE — Progress Notes (Signed)
Initial Nutrition Assessment  DOCUMENTATION CODES:   Not applicable  INTERVENTION:    Magic cup TID with meals, each supplement provides 290 kcal and 9 grams of protein  MVI daily   NUTRITION DIAGNOSIS:   Increased nutrient needs related to acute illness as evidenced by estimated needs.  GOAL:   Patient will meet greater than or equal to 90% of their needs  MONITOR:   PO intake,Supplement acceptance,Weight trends,Labs,I & O's  REASON FOR ASSESSMENT:   Consult Assessment of nutrition requirement/status  ASSESSMENT:   Patient with PMH significant for reactive airway disease, essential HTN, uveitis, and hypothyroidism. Presents this admission with RSV bronchiolitis.  Pt endorses a slight loss in appetite over the last two days. Prior to this time period she consumed three meals daily that consisted of B- eggs, toast L- soup and sandwich D- meat, vegetable, grain. Does not use supplementation. Appetite slow to progress this admission due to breathing issues. Discussed the importance of protein intake for preservation of lean body mass. Does not like supplements, willing to try Magic Cup.   Pt unsure of exact UBW and endorses a slightly weight loss of 2-3 lbs. Records indicate pt weighed 90.9 kg and show a stated weight of 86.2 kg. Will need to obtain recent wt to assess for weight loss.   Medications: prednisone Labs: K 3.2 (L)   NUTRITION - FOCUSED PHYSICAL EXAM:  Flowsheet Row Most Recent Value  Orbital Region No depletion  Upper Arm Region No depletion  Thoracic and Lumbar Region No depletion  Buccal Region No depletion  Temple Region No depletion  Clavicle Bone Region No depletion  Clavicle and Acromion Bone Region No depletion  Scapular Bone Region No depletion  Dorsal Hand No depletion  Patellar Region No depletion  Anterior Thigh Region No depletion  Posterior Calf Region No depletion  Edema (RD Assessment) None  Hair Reviewed  Eyes Reviewed  Mouth  Reviewed  Skin Reviewed  Nails Reviewed     Diet Order:   Diet Order            Diet regular Room service appropriate? Yes; Fluid consistency: Thin  Diet effective now                 EDUCATION NEEDS:   Not appropriate for education at this time  Skin:  Skin Assessment: Reviewed RN Assessment  Last BM:  1/5  Height:   Ht Readings from Last 1 Encounters:  05/18/20 5\' 4"  (1.626 m)    Weight:   Wt Readings from Last 1 Encounters:  05/18/20 86.2 kg    BMI:  Body mass index is 32.61 kg/m.  Estimated Nutritional Needs:   Kcal:  1800-2000 kcal  Protein:  90-105 grams  Fluid:  >/= 1.8 L/day   07/16/20 RD, LDN Clinical Nutrition Pager listed in AMION

## 2020-05-20 NOTE — Progress Notes (Signed)
PROGRESS NOTE    Monica Estrada  [id number] DOB: 06-18-1965 DOA: 05/18/2020 PCP: Burnis Medin, MD    Brief Narrative:  Monica Estrada is a 55 year old female with past medical history significant for reactive airway disease, essential hypertension, uveitis, hypothyroidism who presented to the ED with progressive shortness of breath.  Patient at home was starting to use her neb treatments without significant improvement.  Patient was seen at urgent care on Monday and was given albuterol and a steroid injection and was instructed to present to the ED if not feeling better.  Unfortunately, patient's symptoms continue to progress.  Patient with history of bronchitis 1-2 times a year but no history of asthma or COPD.  Denies tobacco use history.  Not on home O2.  Denies any fever/chills.  In the ED, temperature 97.6, HR 113, RR 35, BP 195/109, SPO2 96% on 2LNC.  Sodium 137, potassium 3.9, chloride 105, CO2 19, glucose 174, BUN 22, creatinine 0.85.  WBC count 13.2, hemoglobin 13.8, platelets 281.  Chest x-ray with low lung volumes, but otherwise no acute cardiopulmonary disease noted.  Covid-19 PCR/influenza A/B PCR negative.  RSV positive.  Patient was given duo nebs x2, 6 mg IV Solu-Medrol with no significant improvement of respiratory status.  Given patient requiring submental oxygen with significant wheezing, hospitalist service consulted for further evaluation and management of acute hypoxic respiratory failure secondary to RSV bronchiolitis.   Assessment & Plan:   Principal Problem:   RSV/bronchiolitis Active Problems:   Hyperlipemia   UVEITIS   BMI 32.0-32.9,adult   Essential hypertension   Acute hypoxic respiratory failure, POA RSV bronchiolitis Patient presenting to ED following failed outpatient treatment with significant progression shortness of breath/wheezing.  Received albuterol and IM steroids outpatient without significant improvement.  Chest x-ray unrevealing. COVID/Flu  negative. RSV positive. Requiring [facility] to maintain adequate oxygenation on admission. --IV solumedrol x 3 doses now transition to prednisone 50 mg p.o. daily --Budesonide neb BID --DuoNeb every 6 hours scheduled --Albuterol neb q2h prn --Continue supplemental oxygen, maintain SPO2 greater than 92%, currently on 2 L nasal cannula --ambulatory O2 evaluation today --Supportive care, mucolytic's  Hypokalemia Potassium 3.2 today, will replete. --Repeat electrolytes in a.m. to include magnesium  Essential hypertension BP 145/70 this morning --Lisinopril 20 mg p.o. daily --HCTZ 25 mg p.o. daily --Amlodipine 5 mg p.o. daily --Acetazolamide 500 mg p.o. 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  Has been treated with chemotherapy/immune suppression since June 2020.  Plan to start Humira, pending insurance authorization. --Azathioprine --Continue home eyedrops  Obesity Body mass index is 32.61 kg/m.  Discussed need for aggressive lifestyle changes/weight loss as this complicates all facets of care.   DVT prophylaxis: Lovenox Code Status: Full code Family Communication: Updated patient extensively at bedside  Disposition Plan:  Status is: Observation  The patient will require care spanning > 2 midnights and should be moved to inpatient because: Persistent severe electrolyte disturbances, Unsafe d/c plan, IV treatments appropriate due to intensity of illness or inability to take PO and Inpatient level of care appropriate due to severity of illness  Dispo: The patient is from: Home              Anticipated d/c is to: Home              Anticipated d/c date is: 2 days  Patient currently is not medically stable to d/c.   Consultants:   none  Procedures:   None  Antimicrobials:   None   Subjective: Patient seen and examined at  bedside, lying in hospital bed.  Nursing present.  Significant audible wheezing upon entering room.  Patient continues with significant shortness of breath, currently on 2 L nasal cannula which she is not on home oxygen.  He is with persistent cough.  Feels weak and fatigued.  No other complaints or concerns at this time.  Denies headache, no visual changes, no chest pain, palpitations, no abdominal pain, no fever/chills/night sweats, no nausea/vomiting/diarrhea.  No acute events overnight per nursing staff.  Objective: Vitals:   05/20/20 0126 05/20/20 0528 05/20/20 0740 05/20/20 0822  BP:  (!) 145/70  (!) 147/77  Pulse:  77 70 64  Resp:  19 18 19   Temp:  98.3 F (36.8 C)  98.3 F (36.8 C)  TempSrc:  Oral  Oral  SpO2: 98% 97% 96% 98%  Weight:      Height:       No intake or output data in the 24 hours ending 05/20/20 1111 Filed Weights   05/18/20 1425  Weight: 86.2 kg    Examination:  General exam: Appears calm and comfortable, ill in appearance Respiratory system: Diffuse wheezing bilaterally in all lung zones, slightly increased respiratory effort without accessory muscle use, on 2 L nasal cannula Cardiovascular system: S1 & S2 heard, RRR. No JVD, murmurs, rubs, gallops or clicks. No pedal edema. Gastrointestinal system: Abdomen is nondistended, soft and nontender. No organomegaly or masses felt. Normal bowel sounds heard. Central nervous system: Alert and oriented. No focal neurological deficits. Extremities: Symmetric 5 x 5 power. Skin: No rashes, lesions or ulcers Psychiatry: Judgement and insight appear normal. Mood & affect appropriate.     Data Reviewed: I have personally reviewed following labs and imaging studies  CBC: Recent Labs  Lab 05/19/20 0515 05/20/20 0326  WBC 13.2* 9.9  NEUTROABS 12.0*  --   HGB 13.8 12.0  HCT 40.9 36.8  MCV 79.9* 80.0  PLT 281 249   Basic Metabolic Panel: Recent Labs  Lab 05/19/20 0515 05/20/20 0326  NA 137 137  K 3.9 3.2*   CL 105 104  CO2 19* 23  GLUCOSE 174* 161*  BUN 22* 17  CREATININE 0.85 0.74  CALCIUM 9.6 9.4   GFR: Estimated Creatinine Clearance: 84.4 mL/min (by C-G formula based on SCr of 0.74 mg/dL). Liver Function Tests: No results for input(s): AST, ALT, ALKPHOS, BILITOT, PROT, ALBUMIN in the last 168 hours. No results for input(s): LIPASE, AMYLASE in the last 168 hours. No results for input(s): AMMONIA in the last 168 hours. Coagulation Profile: No results for input(s): INR, PROTIME in the last 168 hours. Cardiac Enzymes: No results for input(s): CKTOTAL, CKMB, CKMBINDEX, TROPONINI in the last 168 hours. BNP (last 3 results) No results for input(s): PROBNP in the last 8760 hours. HbA1C: No results for input(s): HGBA1C in the last 72 hours. CBG: No results for input(s): GLUCAP in the last 168 hours. Lipid Profile: No results for input(s): CHOL, HDL, LDLCALC, TRIG, CHOLHDL, LDLDIRECT in the last 72 hours. Thyroid Function Tests: No results for input(s): TSH, T4TOTAL, FREET4, T3FREE, THYROIDAB in the last 72 hours. Anemia Panel: No results for input(s): VITAMINB12, FOLATE, FERRITIN, TIBC, IRON, RETICCTPCT in the last 72 hours. Sepsis Labs: No results for input(s): PROCALCITON, LATICACIDVEN in the last 168 hours.  Recent Results (from the past 240 hour(s))  Novel Coronavirus, NAA ([facility])     Status: None   Collection Time: 05/17/20 11:09 AM   Specimen: Nasopharyngeal Swab; Nasopharyngeal(NP) swabs in vial transport medium   Nasopharynge  Result Value Ref Range Status   SARS-CoV-2, NAA Not Detected Not Detected Final    Comment: This nucleic acid amplification test was developed and its performance characteristics determined by [employer]. Nucleic acid amplification tests include RT-PCR and TMA. This test has not been FDA cleared or approved. This test has been authorized by FDA under an Emergency Use Authorization (EUA). This test is only authorized for the duration of  time the declaration that circumstances exist justifying the authorization of the emergency use of in vitro diagnostic tests for detection of SARS-CoV-2 virus and/or diagnosis of COVID-19 infection under section 564(b)(1) of the Act, 21 U.S.C. [id number]) (1), unless the authorization is terminated or revoked sooner. When diagnostic testing is negative, the possibility of a false negative result should be considered in the context of a patient's recent exposures and the presence of clinical signs and symptoms consistent with COVID-19. An individual without symptoms of COVID-19 and who is not shedding SARS-CoV-2 virus wo uld expect to have a negative (not detected) result in this assay.   SARS-COV-2, NAA 2 DAY TAT     Status: None   Collection Time: 05/17/20 11:09 AM   Nasopharynge  Result Value Ref Range Status   SARS-CoV-2, NAA 2 DAY TAT Performed  Final  Resp Panel by RT-PCR (Flu A&B, Covid) Nasopharyngeal Swab     Status: None   Collection Time: 05/19/20  1:34 AM   Specimen: Nasopharyngeal Swab; Nasopharyngeal(NP) swabs in vial transport medium  Result Value Ref Range Status   SARS Coronavirus 2 by RT PCR NEGATIVE NEGATIVE Final    Comment: (NOTE) SARS-CoV-2 target nucleic acids are NOT DETECTED.  The SARS-CoV-2 RNA is generally detectable in upper respiratory specimens during the acute phase of infection. The lowest concentration of SARS-CoV-2 viral copies this assay can detect is 138 copies/mL. A negative result does not preclude SARS-Cov-2 infection and should not be used as the sole basis for treatment or other patient management decisions. A negative result may occur with  improper specimen collection/handling, submission of specimen other than nasopharyngeal swab, presence of viral mutation(s) within the areas targeted by this assay, and inadequate number of viral copies(<138 copies/mL). A negative result must be combined with clinical observations, patient history, and  epidemiological information. The expected result is Negative.  Fact Sheet for Patients:  EntrepreneurPulse.com.au  Fact Sheet for Healthcare Providers:  IncredibleEmployment.be  This test is no t yet approved or cleared by the [place] FDA and  has been authorized for detection and/or diagnosis of SARS-CoV-2 by FDA under an Emergency Use Authorization (EUA). This EUA will remain  in effect (meaning this test can be used) for the duration of the COVID-19 declaration under Section 564(b)(1) of the Act, 21 U.S.C.section 360bbb-3(b)(1), unless the authorization is terminated  or revoked sooner.       Influenza A by PCR NEGATIVE NEGATIVE Final   Influenza B by PCR NEGATIVE NEGATIVE Final    Comment: (NOTE) The Xpert Xpress SARS-CoV-2/FLU/RSV plus assay is intended as an aid in the diagnosis of influenza from Nasopharyngeal swab specimens and should not be used as a sole basis for treatment. Nasal washings and aspirates are unacceptable for Xpert Xpress SARS-CoV-2/FLU/RSV testing.  Fact Sheet for Patients: EntrepreneurPulse.com.au  Fact Sheet for Healthcare Providers: IncredibleEmployment.be  This test is not yet approved  or cleared by the [place] and has been authorized for detection and/or diagnosis of SARS-CoV-2 by FDA under an Emergency Use Authorization (EUA). This EUA will remain in effect (meaning this test can be used) for the duration of the COVID-19 declaration under Section 564(b)(1) of the Act, 21 U.S.C. section 360bbb-3(b)(1), unless the authorization is terminated or revoked.  Performed at [facility], [place] [address], [place], [place] [address]   Resp panel by RT-PCR (RSV, Flu A&B, Covid)     Status: Abnormal   Collection Time: 05/19/20  1:34 AM  Result Value Ref Range Status   SARS Coronavirus 2 by RT PCR NEGATIVE NEGATIVE Final   Influenza A by PCR NEGATIVE NEGATIVE  Final   Influenza B by PCR NEGATIVE NEGATIVE Final   Resp Syncytial Virus by PCR POSITIVE (A) NEGATIVE Final    Comment: Performed at [facility], [place] [address], [place], [place] [address]         Radiology Studies: DG Chest 2 View  Result Date: 05/19/2020 CLINICAL DATA:  Shortness of breath. EXAM: CHEST - 2 VIEW COMPARISON:  05/17/2020. FINDINGS: Mediastinum and hilar structures normal. Heart size normal. Low lung volumes. No pleural effusion or pneumothorax. Thoracic spine scoliosis. No acute bony abnormality. IMPRESSION: Low lung volumes.  No acute cardiopulmonary disease identified. Electronically Signed   By: Marcello Moores  Register   On: 05/19/2020 05:35        Scheduled Meds: . acetaZOLAMIDE  500 mg Oral BID  . amLODipine  5 mg Oral Daily  . atropine  1 drop Right Eye BID  . azaTHIOprine  100 mg Oral BID  . brimonidine  1 drop Both Eyes Q8H  . dextromethorphan-guaiFENesin  1 tablet Oral BID  . dorzolamide-timolol  1 drop Both Eyes BID  . enoxaparin (LOVENOX) injection  40 mg Subcutaneous Q24H  . hydrochlorothiazide  25 mg Oral Daily  . ipratropium-albuterol  3 mL Nebulization Q6H  . latanoprost  1 drop Both Eyes QHS  . lisinopril  20 mg Oral Daily  . potassium chloride  40 mEq Oral Q3H  . prednisoLONE acetate  1 drop Left Eye Q2H while awake  . predniSONE  50 mg Oral Q breakfast  . sodium chloride flush  3 mL Intravenous Q12H   Continuous Infusions: . lactated ringers 75 mL/hr at 05/20/20 0825     LOS: 0 days    Time spent: 39 minutes spent on chart review, discussion with nursing staff, consultants, updating family and interview/physical exam; more than 50% of that time was spent in counseling and/or coordination of care.    Chosen Geske J [place] DO [facility] Available via Epic secure chat 7am-7pm After these hours, please refer to coverage provider listed on [email or web address] 05/20/2020, 11:11 AM

## 2020-05-20 NOTE — Plan of Care (Signed)

## 2020-05-21 LAB — BASIC METABOLIC PANEL
Anion gap: 6 (ref 5–15)
BUN: 21 mg/dL — ABNORMAL HIGH (ref 6–20)
CO2: 23 mmol/L (ref 22–32)
Calcium: 9.5 mg/dL (ref 8.9–10.3)
Chloride: 109 mmol/L (ref 98–111)
Creatinine, Ser: 0.9 mg/dL (ref 0.44–1.00)
GFR, Estimated: >60 mL/min (ref >60)
Glucose, Bld: 166 mg/dL — ABNORMAL HIGH (ref 70–99)
Potassium: 3.2 mmol/L — ABNORMAL LOW (ref 3.5–5.1)
Sodium: 138 mmol/L (ref 135–145)

## 2020-05-21 LAB — CBC
HCT: 37.7 % (ref 36.0–46.0)
Hemoglobin: 12.2 g/dL (ref 12.0–15.0)
MCH: 26 pg (ref 26.0–34.0)
MCHC: 32.4 g/dL (ref 30.0–36.0)
MCV: 80.2 fL (ref 80.0–100.0)
Platelets: 260 10*3/uL (ref 150–400)
RBC: 4.7 MIL/uL (ref 3.87–5.11)
RDW: 13.5 % (ref 11.5–15.5)
WBC: 9.7 10*3/uL (ref 4.0–10.5)
nRBC: 0 % (ref 0.0–0.2)

## 2020-05-21 LAB — MAGNESIUM: Magnesium: 2.4 mg/dL (ref 1.7–2.4)

## 2020-05-21 MED ORDER — POTASSIUM CHLORIDE CRYS ER 20 MEQ PO TBCR
40.0000 meq | EXTENDED_RELEASE_TABLET | ORAL | Status: AC
  Administered 2020-05-21 (×2): 40 meq via ORAL
  Filled 2020-05-21 (×2): qty 2

## 2020-05-21 NOTE — Progress Notes (Signed)
PROGRESS NOTE    Monica Estrada  FMB:[id number] DOB: 12-10-1965 DOA: 05/18/2020 PCP: Burnis Medin, MD    Brief Narrative:  Monica Estrada is a 55 year old female with past medical history significant for reactive airway disease, essential hypertension, uveitis, hypothyroidism who presented to the ED with progressive shortness of breath.  Patient at home was starting to use her neb treatments without significant improvement.  Patient was seen at urgent care on Monday and was given albuterol and a steroid injection and was instructed to present to the ED if not feeling better.  Unfortunately, patient's symptoms continue to progress.  Patient with history of bronchitis 1-2 times a year but no history of asthma or COPD.  Denies tobacco use history.  Not on home O2.  Denies any fever/chills.  In the ED, temperature 97.6, HR 113, RR 35, BP 195/109, SPO2 96% on 2LNC.  Sodium 137, potassium 3.9, chloride 105, CO2 19, glucose 174, BUN 22, creatinine 0.85.  WBC count 13.2, hemoglobin 13.8, platelets 281.  Chest x-ray with low lung volumes, but otherwise no acute cardiopulmonary disease noted.  Covid-19 PCR/influenza A/B PCR negative.  RSV positive.  Patient was given duo nebs x2, 6 mg IV Solu-Medrol with no significant improvement of respiratory status.  Given patient requiring submental oxygen with significant wheezing, hospitalist service consulted for further evaluation and management of acute hypoxic respiratory failure secondary to RSV bronchiolitis.   Assessment & Plan:   Principal Problem:   RSV/bronchiolitis Active Problems:   Hyperlipemia   UVEITIS   BMI 32.0-32.9,adult   Essential hypertension   Acute respiratory failure with hypoxia (HCC)   Acute hypoxic respiratory failure, POA RSV bronchiolitis Patient presenting to ED following failed outpatient treatment with significant progression shortness of breath/wheezing.  Received albuterol and IM steroids outpatient without significant  improvement.  Chest x-ray unrevealing. COVID/Flu negative. RSV positive. Requiring [facility] to maintain adequate oxygenation on admission. --IV solumedrol x 3 doses now transitioned to prednisone 50 mg p.o. daily --Budesonide neb BID --DuoNeb every 6 hours scheduled --Albuterol neb q2h prn --Continue supplemental oxygen, maintain SPO2 greater than 92%, now weaned to room air --Supportive care, mucolytic's  Hypokalemia Potassium 3.2 today magnesium 2.4, will replete potassium. --Repeat electrolytes in a.m. to include magnesium  Essential hypertension BP 124/74 this morning --Lisinopril 20 mg p.o. daily --HCTZ 25 mg p.o. daily --Amlodipine 5 mg p.o. daily --Acetazolamide 500 mg p.o. 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  Has been treated with chemotherapy/immune suppression since June 2020.  Plan to start Humira, pending insurance authorization. --Azathioprine --Continue home eyedrops  Obesity Body mass index is 32.61 kg/m.  Discussed need for aggressive lifestyle changes/weight loss as this complicates all facets of care.   DVT prophylaxis: Lovenox Code Status: Full code Family Communication: Updated patient extensively at bedside  Disposition Plan:  Status is: Observation  The patient will require care spanning > 2 midnights and should be moved to inpatient because: Persistent severe electrolyte disturbances, Unsafe d/c plan, IV treatments appropriate due to intensity of illness or inability to take PO and Inpatient level of care appropriate due to severity of illness  Dispo: The patient is from: Home              Anticipated d/c is to: Home              Anticipated d/c date is: 1  day              Patient currently is not medically stable to d/c.   Consultants:   none  Procedures:   None  Antimicrobials:    None   Subjective: Patient seen and examined at bedside, lying in hospital bed.  Weaned off of supplemental oxygen yesterday.  Continues with significant wheezing and shortness of breath, slightly improved since yesterday.  Also with persistent cough and weakness and fatigue.  No other complaints or concerns at this time.  States neb treatments helping.  Denies headache, no visual changes, no chest pain, no palpitations, no abdominal pain, no fever/chills/night sweats, no nausea/vomiting/diarrhea.  No acute events overnight per nursing staff.   Objective: Vitals:   05/20/20 2155 05/21/20 0205 05/21/20 0635 05/21/20 0817  BP: (!) 141/74  124/74   Pulse: 62 62 66 75  Resp: 16 16 16 18   Temp: (!) 97.5 F (36.4 C)  97.9 F (36.6 C)   TempSrc: Oral  Oral   SpO2: 96% 96% 95% 93%  Weight:      Height:       No intake or output data in the 24 hours ending 05/21/20 1114 Filed Weights   05/18/20 1425  Weight: 86.2 kg    Examination:  General exam: Appears calm and comfortable, ill in appearance Respiratory system: Diffuse wheezing bilaterally in all lung zones, slightly increased respiratory effort without accessory muscle use, on room air with SPO2 95% Cardiovascular system: S1 & S2 heard, RRR. No JVD, murmurs, rubs, gallops or clicks. No pedal edema. Gastrointestinal system: Abdomen is nondistended, soft and nontender. No organomegaly or masses felt. Normal bowel sounds heard. Central nervous system: Alert and oriented. No focal neurological deficits. Extremities: Symmetric 5 x 5 power. Skin: No rashes, lesions or ulcers Psychiatry: Judgement and insight appear normal. Mood & affect appropriate.     Data Reviewed: I have personally reviewed following labs and imaging studies  CBC: Recent Labs  Lab 05/19/20 0515 05/20/20 0326 05/21/20 0109  WBC 13.2* 9.9 9.7  NEUTROABS 12.0*  --   --   HGB 13.8 12.0 12.2  HCT 40.9 36.8 37.7  MCV 79.9* 80.0 80.2  PLT 281 249 [id number]    Basic Metabolic Panel: Recent Labs  Lab 05/19/20 0515 05/20/20 0326 05/21/20 0109  NA 137 137 138  K 3.9 3.2* 3.2*  CL 105 104 109  CO2 19* 23 23  GLUCOSE 174* 161* 166*  BUN 22* 17 21*  CREATININE 0.85 0.74 0.90  CALCIUM 9.6 9.4 9.5  MG  --   --  2.4   GFR: Estimated Creatinine Clearance: 75 mL/min (by C-G formula based on SCr of 0.9 mg/dL). Liver Function Tests: No results for input(s): AST, ALT, ALKPHOS, BILITOT, PROT, ALBUMIN in the last 168 hours. No results for input(s): LIPASE, AMYLASE in the last 168 hours. No results for input(s): AMMONIA in the last 168 hours. Coagulation Profile: No results for input(s): INR, PROTIME in the last 168 hours. Cardiac Enzymes: No results for input(s): CKTOTAL, CKMB, CKMBINDEX, TROPONINI in the last 168 hours. BNP (last 3 results) No results for input(s): PROBNP in the last 8760 hours. HbA1C: No results for input(s): HGBA1C in the last 72 hours. CBG: No results for input(s): GLUCAP in the last 168 hours. Lipid Profile: No results for input(s): CHOL, HDL, LDLCALC, TRIG, CHOLHDL, LDLDIRECT in the last 72 hours. Thyroid Function Tests: No results for input(s): TSH, T4TOTAL, FREET4, T3FREE, THYROIDAB in the last 72 hours. Anemia  Panel: No results for input(s): VITAMINB12, FOLATE, FERRITIN, TIBC, IRON, RETICCTPCT in the last 72 hours. Sepsis Labs: No results for input(s): PROCALCITON, LATICACIDVEN in the last 168 hours.  Recent Results (from the past 240 hour(s))  Novel Coronavirus, NAA ([facility])     Status: None   Collection Time: 05/17/20 11:09 AM   Specimen: Nasopharyngeal Swab; Nasopharyngeal(NP) swabs in vial transport medium   Nasopharynge  Result Value Ref Range Status   SARS-CoV-2, NAA Not Detected Not Detected Final    Comment: This nucleic acid amplification test was developed and its performance characteristics determined by [employer]. Nucleic acid amplification tests include RT-PCR and TMA. This test has  not been FDA cleared or approved. This test has been authorized by FDA under an Emergency Use Authorization (EUA). This test is only authorized for the duration of time the declaration that circumstances exist justifying the authorization of the emergency use of in vitro diagnostic tests for detection of SARS-CoV-2 virus and/or diagnosis of COVID-19 infection under section 564(b)(1) of the Act, 21 U.S.C. [id number]) (1), unless the authorization is terminated or revoked sooner. When diagnostic testing is negative, the possibility of a false negative result should be considered in the context of a patient's recent exposures and the presence of clinical signs and symptoms consistent with COVID-19. An individual without symptoms of COVID-19 and who is not shedding SARS-CoV-2 virus wo uld expect to have a negative (not detected) result in this assay.   SARS-COV-2, NAA 2 DAY TAT     Status: None   Collection Time: 05/17/20 11:09 AM   Nasopharynge  Result Value Ref Range Status   SARS-CoV-2, NAA 2 DAY TAT Performed  Final  Resp Panel by RT-PCR (Flu A&B, Covid) Nasopharyngeal Swab     Status: None   Collection Time: 05/19/20  1:34 AM   Specimen: Nasopharyngeal Swab; Nasopharyngeal(NP) swabs in vial transport medium  Result Value Ref Range Status   SARS Coronavirus 2 by RT PCR NEGATIVE NEGATIVE Final    Comment: (NOTE) SARS-CoV-2 target nucleic acids are NOT DETECTED.  The SARS-CoV-2 RNA is generally detectable in upper respiratory specimens during the acute phase of infection. The lowest concentration of SARS-CoV-2 viral copies this assay can detect is 138 copies/mL. A negative result does not preclude SARS-Cov-2 infection and should not be used as the sole basis for treatment or other patient management decisions. A negative result may occur with  improper specimen collection/handling, submission of specimen other than nasopharyngeal swab, presence of viral mutation(s) within the areas  targeted by this assay, and inadequate number of viral copies(<138 copies/mL). A negative result must be combined with clinical observations, patient history, and epidemiological information. The expected result is Negative.  Fact Sheet for Patients:  EntrepreneurPulse.com.au  Fact Sheet for Healthcare Providers:  IncredibleEmployment.be  This test is no t yet approved or cleared by the [place] FDA and  has been authorized for detection and/or diagnosis of SARS-CoV-2 by FDA under an Emergency Use Authorization (EUA). This EUA will remain  in effect (meaning this test can be used) for the duration of the COVID-19 declaration under Section 564(b)(1) of the Act, 21 U.S.C.section 360bbb-3(b)(1), unless the authorization is terminated  or revoked sooner.       Influenza A by PCR NEGATIVE NEGATIVE Final   Influenza B by PCR NEGATIVE NEGATIVE Final    Comment: (NOTE) The Xpert Xpress SARS-CoV-2/FLU/RSV plus assay is intended as an aid in the diagnosis of influenza from Nasopharyngeal swab specimens and should not be  used as a sole basis for treatment. Nasal washings and aspirates are unacceptable for Xpert Xpress SARS-CoV-2/FLU/RSV testing.  Fact Sheet for Patients: EntrepreneurPulse.com.au  Fact Sheet for Healthcare Providers: IncredibleEmployment.be  This test is not yet approved or cleared by the [place] FDA and has been authorized for detection and/or diagnosis of SARS-CoV-2 by FDA under an Emergency Use Authorization (EUA). This EUA will remain in effect (meaning this test can be used) for the duration of the COVID-19 declaration under Section 564(b)(1) of the Act, 21 U.S.C. section 360bbb-3(b)(1), unless the authorization is terminated or revoked.  Performed at [facility], [place] [address], [place], [place] [address]   Resp panel by RT-PCR (RSV, Flu A&B, Covid)     Status: Abnormal    Collection Time: 05/19/20  1:34 AM  Result Value Ref Range Status   SARS Coronavirus 2 by RT PCR NEGATIVE NEGATIVE Final   Influenza A by PCR NEGATIVE NEGATIVE Final   Influenza B by PCR NEGATIVE NEGATIVE Final   Resp Syncytial Virus by PCR POSITIVE (A) NEGATIVE Final    Comment: Performed at [facility], [place] [address], [place], [place] [address]         Radiology Studies: No results found.      Scheduled Meds: . acetaZOLAMIDE  500 mg Oral BID  . amLODipine  5 mg Oral Daily  . atropine  1 drop Right Eye BID  . azaTHIOprine  100 mg Oral BID  . brimonidine  1 drop Both Eyes Q8H  . budesonide (PULMICORT) nebulizer solution  0.5 mg Nebulization BID  . dextromethorphan-guaiFENesin  1 tablet Oral BID  . dorzolamide-timolol  1 drop Both Eyes BID  . enoxaparin (LOVENOX) injection  40 mg Subcutaneous Q24H  . hydrochlorothiazide  25 mg Oral Daily  . ipratropium-albuterol  3 mL Nebulization Q6H  . latanoprost  1 drop Both Eyes QHS  . lisinopril  20 mg Oral Daily  . multivitamin with minerals  1 tablet Oral Daily  . potassium chloride  40 mEq Oral Q3H  . prednisoLONE acetate  1 drop Left Eye Q2H while awake  . predniSONE  50 mg Oral Q breakfast  . sodium chloride flush  3 mL Intravenous Q12H   Continuous Infusions:    LOS: 1 day    Time spent: 38 minutes spent on chart review, discussion with nursing staff, consultants, updating family and interview/physical exam; more than 50% of that time was spent in counseling and/or coordination of care.    Juliya Magill J [place] DO [facility] Available via Epic secure chat 7am-7pm After these hours, please refer to coverage provider listed on [email or web address] 05/21/2020, 11:14 AM

## 2020-05-22 LAB — BASIC METABOLIC PANEL
Anion gap: 10 (ref 5–15)
BUN: 19 mg/dL (ref 6–20)
CO2: 20 mmol/L — ABNORMAL LOW (ref 22–32)
Calcium: 9.3 mg/dL (ref 8.9–10.3)
Chloride: 108 mmol/L (ref 98–111)
Creatinine, Ser: 0.6 mg/dL (ref 0.44–1.00)
GFR, Estimated: >60 mL/min (ref >60)
Glucose, Bld: 148 mg/dL — ABNORMAL HIGH (ref 70–99)
Potassium: 3.1 mmol/L — ABNORMAL LOW (ref 3.5–5.1)
Sodium: 138 mmol/L (ref 135–145)

## 2020-05-22 LAB — MAGNESIUM: Magnesium: 2.4 mg/dL (ref 1.7–2.4)

## 2020-05-22 MED ORDER — POTASSIUM CHLORIDE CRYS ER 20 MEQ PO TBCR
40.0000 meq | EXTENDED_RELEASE_TABLET | ORAL | Status: AC
  Administered 2020-05-22 (×2): 40 meq via ORAL
  Filled 2020-05-22 (×3): qty 2

## 2020-05-22 MED ORDER — HYDROCOD POLST-CPM POLST ER 10-8 MG/5ML PO SUER
5.0000 mL | Freq: Two times a day (BID) | ORAL | Status: DC | PRN
  Administered 2020-05-22 – 2020-05-24 (×3): 5 mL via ORAL
  Filled 2020-05-22 (×3): qty 5

## 2020-05-22 NOTE — Progress Notes (Signed)
PROGRESS NOTE    Monica Estrada  LOV:[id number] DOB: 1965-10-07 DOA: 05/18/2020 PCP: Burnis Medin, MD    Brief Narrative:  Kamri Gotsch is a 55 year old female with past medical history significant for reactive airway disease, essential hypertension, uveitis, hypothyroidism who presented to the ED with progressive shortness of breath.  Patient at home was starting to use her neb treatments without significant improvement.  Patient was seen at urgent care on Monday and was given albuterol and a steroid injection and was instructed to present to the ED if not feeling better.  Unfortunately, patient's symptoms continue to progress.  Patient with history of bronchitis 1-2 times a year but no history of asthma or COPD.  Denies tobacco use history.  Not on home O2.  Denies any fever/chills.  In the ED, temperature 97.6, HR 113, RR 35, BP 195/109, SPO2 96% on 2LNC.  Sodium 137, potassium 3.9, chloride 105, CO2 19, glucose 174, BUN 22, creatinine 0.85.  WBC count 13.2, hemoglobin 13.8, platelets 281.  Chest x-ray with low lung volumes, but otherwise no acute cardiopulmonary disease noted.  Covid-19 PCR/influenza A/B PCR negative.  RSV positive.  Patient was given duo nebs x2, 6 mg IV Solu-Medrol with no significant improvement of respiratory status.  Given patient requiring submental oxygen with significant wheezing, hospitalist service consulted for further evaluation and management of acute hypoxic respiratory failure secondary to RSV bronchiolitis.   Assessment & Plan:   Principal Problem:   RSV/bronchiolitis Active Problems:   Hyperlipemia   UVEITIS   BMI 32.0-32.9,adult   Essential hypertension   Acute respiratory failure with hypoxia (HCC)   Acute hypoxic respiratory failure, POA RSV bronchiolitis Patient presenting to ED following failed outpatient treatment with significant progression shortness of breath/wheezing.  Received albuterol and IM steroids outpatient without significant  improvement.  Chest x-ray unrevealing. COVID/Flu negative. RSV positive. Required 2LNC to maintain adequate oxygenation on admission. --IV solumedrol x 3 doses now transitioned to prednisone 50 mg p.o. daily --Budesonide neb BID --DuoNeb every 6 hours scheduled --Albuterol neb q2h prn --Continue supplemental oxygen, maintain SPO2 greater than 92%, now weaned to room air --Supportive care, mucolytic's  Hypokalemia Potassium 3.1 today magnesium 2.4, will replete potassium. --Repeat electrolytes in a.m. to include magnesium  Essential hypertension BP 120/82 this morning --Lisinopril 20 mg p.o. daily --HCTZ 25 mg p.o. daily --Amlodipine 5 mg p.o. daily --Acetazolamide 500 mg p.o. twice daily --Hydralazine 5 mg IV every 4 hours as needed SBP greater than 160  Uveitis Follows with [facility]mology at [facility].  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  Has been treated with chemotherapy/immune suppression since June 2020.  Plan to start Humira, pending insurance authorization. --Azathioprine --Continue home eyedrops  Obesity Body mass index is 32.61 kg/m.  Discussed need for aggressive lifestyle changes/weight loss as this complicates all facets of care.   DVT prophylaxis: Lovenox Code Status: Full code Family Communication: Updated patient extensively at bedside  Disposition Plan:  Status is: Observation  The patient will require care spanning > 2 midnights and should be moved to inpatient because: Persistent severe electrolyte disturbances, Unsafe d/c plan, IV treatments appropriate due to intensity of illness or inability to take PO and Inpatient level of care appropriate due to severity of illness  Dispo: The patient is from: Home              Anticipated d/c is to: Home              Anticipated d/c date is: 1  day              Patient currently is not medically stable to d/c.   Consultants:   none  Procedures:   None  Antimicrobials:    None   Subjective: Patient seen and examined at bedside, sitting at edge of bed.  Continues with significant wheezing and shortness of breath and severe cough with minimal exertion.  Overall slightly better since yesterday.  No other complaints or concerns at this time.  Denies headache, no visual changes, no chest pain, no palpitations, no abdominal pain, no fever/chills/night sweats, no nausea/vomiting/diarrhea.  No acute events overnight per nursing staff.   Objective: Vitals:   05/21/20 2119 05/22/20 0531 05/22/20 0749 05/22/20 0836  BP: 119/80 120/82  (!) 136/91  Pulse: 75 67    Resp: 18 16  18   Temp: 98.3 F (36.8 C) 98 F (36.7 C)  98.3 F (36.8 C)  TempSrc: Oral Oral  Oral  SpO2: 98% 98% 98% 96%  Weight:      Height:        Intake/Output Summary (Last 24 hours) at 05/22/2020 1036 Last data filed at 05/22/2020 0532 Gross per 24 hour  Intake 3 ml  Output 700 ml  Net -697 ml   Filed Weights   05/18/20 1425  Weight: 86.2 kg    Examination:  General exam: Appears calm and comfortable, ill in appearance Respiratory system: Diffuse wheezing bilaterally in all lung zones, normal respiratory effort without accessory muscle use, on room air with SPO2 98% Cardiovascular system: S1 & S2 heard, RRR. No JVD, murmurs, rubs, gallops or clicks. No pedal edema. Gastrointestinal system: Abdomen is nondistended, soft and nontender. No organomegaly or masses felt. Normal bowel sounds heard. Central nervous system: Alert and oriented. No focal neurological deficits. Extremities: Symmetric 5 x 5 power. Skin: No rashes, lesions or ulcers Psychiatry: Judgement and insight appear normal. Mood & affect appropriate.     Data Reviewed: I have personally reviewed following labs and imaging studies  CBC: Recent Labs  Lab 05/19/20 0515 05/20/20 0326 05/21/20 0109  WBC 13.2* 9.9 9.7  NEUTROABS 12.0*  --   --   HGB 13.8 12.0 12.2  HCT 40.9 36.8 37.7  MCV 79.9* 80.0 80.2  PLT 281  [id number]   Basic Metabolic Panel: Recent Labs  Lab 05/19/20 0515 05/20/20 0326 05/21/20 0109 05/22/20 0520  NA 137 137 138 138  K 3.9 3.2* 3.2* 3.1*  CL 105 104 109 108  CO2 19* 23 23 20*  GLUCOSE 174* 161* 166* 148*  BUN 22* 17 21* 19  CREATININE 0.85 0.74 0.90 0.60  CALCIUM 9.6 9.4 9.5 9.3  MG  --   --  2.4 2.4   GFR: Estimated Creatinine Clearance: 84.4 mL/min (by C-G formula based on SCr of 0.6 mg/dL). Liver Function Tests: No results for input(s): AST, ALT, ALKPHOS, BILITOT, PROT, ALBUMIN in the last 168 hours. No results for input(s): LIPASE, AMYLASE in the last 168 hours. No results for input(s): AMMONIA in the last 168 hours. Coagulation Profile: No results for input(s): INR, PROTIME in the last 168 hours. Cardiac Enzymes: No results for input(s): CKTOTAL, CKMB, CKMBINDEX, TROPONINI in the last 168 hours. BNP (last 3 results) No results for input(s): PROBNP in the last 8760 hours. HbA1C: No results for input(s): HGBA1C in the last 72 hours. CBG: No results for input(s): GLUCAP in the last 168 hours. Lipid Profile: No results for input(s): CHOL, HDL, LDLCALC, TRIG, CHOLHDL, LDLDIRECT in the  last 72 hours. Thyroid Function Tests: No results for input(s): TSH, T4TOTAL, FREET4, T3FREE, THYROIDAB in the last 72 hours. Anemia Panel: No results for input(s): VITAMINB12, FOLATE, FERRITIN, TIBC, IRON, RETICCTPCT in the last 72 hours. Sepsis Labs: No results for input(s): PROCALCITON, LATICACIDVEN in the last 168 hours.  Recent Results (from the past 240 hour(s))  Novel Coronavirus, NAA ([facility])     Status: None   Collection Time: 05/17/20 11:09 AM   Specimen: Nasopharyngeal Swab; Nasopharyngeal(NP) swabs in vial transport medium   Nasopharynge  Result Value Ref Range Status   SARS-CoV-2, NAA Not Detected Not Detected Final    Comment: This nucleic acid amplification test was developed and its performance characteristics determined by [employer]. Nucleic  acid amplification tests include RT-PCR and TMA. This test has not been FDA cleared or approved. This test has been authorized by FDA under an Emergency Use Authorization (EUA). This test is only authorized for the duration of time the declaration that circumstances exist justifying the authorization of the emergency use of in vitro diagnostic tests for detection of SARS-CoV-2 virus and/or diagnosis of COVID-19 infection under section 564(b)(1) of the Act, 21 U.S.C. [id number]) (1), unless the authorization is terminated or revoked sooner. When diagnostic testing is negative, the possibility of a false negative result should be considered in the context of a patient's recent exposures and the presence of clinical signs and symptoms consistent with COVID-19. An individual without symptoms of COVID-19 and who is not shedding SARS-CoV-2 virus wo uld expect to have a negative (not detected) result in this assay.   SARS-COV-2, NAA 2 DAY TAT     Status: None   Collection Time: 05/17/20 11:09 AM   Nasopharynge  Result Value Ref Range Status   SARS-CoV-2, NAA 2 DAY TAT Performed  Final  Resp Panel by RT-PCR (Flu A&B, Covid) Nasopharyngeal Swab     Status: None   Collection Time: 05/19/20  1:34 AM   Specimen: Nasopharyngeal Swab; Nasopharyngeal(NP) swabs in vial transport medium  Result Value Ref Range Status   SARS Coronavirus 2 by RT PCR NEGATIVE NEGATIVE Final    Comment: (NOTE) SARS-CoV-2 target nucleic acids are NOT DETECTED.  The SARS-CoV-2 RNA is generally detectable in upper respiratory specimens during the acute phase of infection. The lowest concentration of SARS-CoV-2 viral copies this assay can detect is 138 copies/mL. A negative result does not preclude SARS-Cov-2 infection and should not be used as the sole basis for treatment or other patient management decisions. A negative result may occur with  improper specimen collection/handling, submission of specimen other than  nasopharyngeal swab, presence of viral mutation(s) within the areas targeted by this assay, and inadequate number of viral copies(<138 copies/mL). A negative result must be combined with clinical observations, patient history, and epidemiological information. The expected result is Negative.  Fact Sheet for Patients:  EntrepreneurPulse.com.au  Fact Sheet for Healthcare Providers:  IncredibleEmployment.be  This test is no t yet approved or cleared by the [place] FDA and  has been authorized for detection and/or diagnosis of SARS-CoV-2 by FDA under an Emergency Use Authorization (EUA). This EUA will remain  in effect (meaning this test can be used) for the duration of the COVID-19 declaration under Section 564(b)(1) of the Act, 21 U.S.C.section 360bbb-3(b)(1), unless the authorization is terminated  or revoked sooner.       Influenza A by PCR NEGATIVE NEGATIVE Final   Influenza B by PCR NEGATIVE NEGATIVE Final    Comment: (NOTE) The Xpert Xpress  SARS-CoV-2/FLU/RSV plus assay is intended as an aid in the diagnosis of influenza from Nasopharyngeal swab specimens and should not be used as a sole basis for treatment. Nasal washings and aspirates are unacceptable for Xpert Xpress SARS-CoV-2/FLU/RSV testing.  Fact Sheet for Patients: EntrepreneurPulse.com.au  Fact Sheet for Healthcare Providers: IncredibleEmployment.be  This test is not yet approved or cleared by the [place] FDA and has been authorized for detection and/or diagnosis of SARS-CoV-2 by FDA under an Emergency Use Authorization (EUA). This EUA will remain in effect (meaning this test can be used) for the duration of the COVID-19 declaration under Section 564(b)(1) of the Act, 21 U.S.C. section 360bbb-3(b)(1), unless the authorization is terminated or revoked.  Performed at [facility], [place] [address] [place], [place] [address]    Resp panel by RT-PCR (RSV, Flu A&B, Covid)     Status: Abnormal   Collection Time: 05/19/20  1:34 AM  Result Value Ref Range Status   SARS Coronavirus 2 by RT PCR NEGATIVE NEGATIVE Final   Influenza A by PCR NEGATIVE NEGATIVE Final   Influenza B by PCR NEGATIVE NEGATIVE Final   Resp Syncytial Virus by PCR POSITIVE (A) NEGATIVE Final    Comment: Performed at [facility], [place] [address], [place], [place] [address]         Radiology Studies: No results found.      Scheduled Meds: . acetaZOLAMIDE  500 mg Oral BID  . amLODipine  5 mg Oral Daily  . atropine  1 drop Right Eye BID  . azaTHIOprine  100 mg Oral BID  . brimonidine  1 drop Both Eyes Q8H  . budesonide (PULMICORT) nebulizer solution  0.5 mg Nebulization BID  . dextromethorphan-guaiFENesin  1 tablet Oral BID  . dorzolamide-timolol  1 drop Both Eyes BID  . enoxaparin (LOVENOX) injection  40 mg Subcutaneous Q24H  . hydrochlorothiazide  25 mg Oral Daily  . ipratropium-albuterol  3 mL Nebulization Q6H  . latanoprost  1 drop Both Eyes QHS  . lisinopril  20 mg Oral Daily  . multivitamin with minerals  1 tablet Oral Daily  . potassium chloride  40 mEq Oral Q3H  . prednisoLONE acetate  1 drop Left Eye Q2H while awake  . predniSONE  50 mg Oral Q breakfast  . sodium chloride flush  3 mL Intravenous Q12H   Continuous Infusions:    LOS: 2 days    Time spent: 37 minutes spent on chart review, discussion with nursing staff, consultants, updating family and interview/physical exam; more than 50% of that time was spent in counseling and/or coordination of care.    Ghazi Rumpf J [place] DO [facility] Available via Epic secure chat 7am-7pm After these hours, please refer to coverage provider listed on [email or web address] 05/22/2020, 10:36 AM

## 2020-05-23 LAB — BASIC METABOLIC PANEL
Anion gap: 8 (ref 5–15)
BUN: 23 mg/dL — ABNORMAL HIGH (ref 6–20)
CO2: 22 mmol/L (ref 22–32)
Calcium: 9.6 mg/dL (ref 8.9–10.3)
Chloride: 108 mmol/L (ref 98–111)
Creatinine, Ser: 0.76 mg/dL (ref 0.44–1.00)
GFR, Estimated: >60 mL/min (ref >60)
Glucose, Bld: 167 mg/dL — ABNORMAL HIGH (ref 70–99)
Potassium: 3.3 mmol/L — ABNORMAL LOW (ref 3.5–5.1)
Sodium: 138 mmol/L (ref 135–145)

## 2020-05-23 LAB — MAGNESIUM: Magnesium: 2.4 mg/dL (ref 1.7–2.4)

## 2020-05-23 MED ORDER — METHYLPREDNISOLONE SODIUM SUCC 40 MG IJ SOLR
40.0000 mg | Freq: Two times a day (BID) | INTRAMUSCULAR | Status: DC
  Administered 2020-05-23 – 2020-05-25 (×5): 40 mg via INTRAVENOUS
  Filled 2020-05-23 (×5): qty 1

## 2020-05-23 MED ORDER — POTASSIUM CHLORIDE CRYS ER 20 MEQ PO TBCR
40.0000 meq | EXTENDED_RELEASE_TABLET | ORAL | Status: AC
  Administered 2020-05-23 (×2): 40 meq via ORAL
  Filled 2020-05-23 (×2): qty 2

## 2020-05-23 NOTE — Progress Notes (Signed)
SATURATION QUALIFICATIONS: (This note is used to comply with regulatory documentation for home oxygen)  Patient Saturations on Room Air at Rest = 94%  Patient Saturations on Room Air while Ambulating = 95%  Patient Saturations on N/A Liters of oxygen while Ambulating = N/A%  Please briefly explain why patient needs home oxygen:Patient did not required oxygen, but she did c/o having shortness of breath while ambulating

## 2020-05-23 NOTE — Progress Notes (Signed)
PROGRESS NOTE    Rajvi Armentor  KYH:[id number] DOB: December 25, 1965 DOA: 05/18/2020 PCP: Burnis Medin, MD    Brief Narrative:  Monica Estrada is a 55 year old female with past medical history significant for reactive airway disease, essential hypertension, uveitis, hypothyroidism who presented to the ED with progressive shortness of breath.  Patient at home was starting to use her neb treatments without significant improvement.  Patient was seen at urgent care on Monday and was given albuterol and a steroid injection and was instructed to present to the ED if not feeling better.  Unfortunately, patient's symptoms continue to progress.  Patient with history of bronchitis 1-2 times a year but no history of asthma or COPD.  Denies tobacco use history.  Not on home O2.  Denies any fever/chills.  In the ED, temperature 97.6, HR 113, RR 35, BP 195/109, SPO2 96% on 2LNC.  Sodium 137, potassium 3.9, chloride 105, CO2 19, glucose 174, BUN 22, creatinine 0.85.  WBC count 13.2, hemoglobin 13.8, platelets 281.  Chest x-ray with low lung volumes, but otherwise no acute cardiopulmonary disease noted.  Covid-19 PCR/influenza A/B PCR negative.  RSV positive.  Patient was given duo nebs x2, 6 mg IV Solu-Medrol with no significant improvement of respiratory status.  Given patient requiring submental oxygen with significant wheezing, hospitalist service consulted for further evaluation and management of acute hypoxic respiratory failure secondary to RSV bronchiolitis.   Assessment & Plan:   Principal Problem:   RSV/bronchiolitis Active Problems:   Hyperlipemia   UVEITIS   BMI 32.0-32.9,adult   Essential hypertension   Acute respiratory failure with hypoxia (HCC)   Acute hypoxic respiratory failure, POA RSV bronchiolitis Patient presenting to ED following failed outpatient treatment with significant progression shortness of breath/wheezing.  Received albuterol and IM steroids outpatient without significant  improvement.  Chest x-ray unrevealing. COVID/Flu negative. RSV positive. Required 2LNC to maintain adequate oxygenation on admission. --Change prednisone back to Solu-Medrol 40 mg IV every 12 hours --Budesonide neb BID --DuoNeb every 6 hours scheduled --Albuterol neb q2h prn --Continue supplemental oxygen, maintain SPO2 greater than 92%, now weaned to room air --Supportive care, mucolytic's --Repeat ambulatory O2 screening today  Hypokalemia Potassium 3.3 today magnesium 2.4, will replete potassium. --Repeat electrolytes in a.m. to include magnesium  Essential hypertension BP 117/70 this morning --Lisinopril 20 mg p.o. daily --HCTZ 25 mg p.o. daily --Amlodipine 5 mg p.o. daily --Acetazolamide 500 mg p.o. 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  Has been treated with chemotherapy/immune suppression since June 2020.  Plan to start Humira, pending insurance authorization. --Azathioprine --Continue home eyedrops  Obesity Body mass index is 32.61 kg/m.  Discussed need for aggressive lifestyle changes/weight loss as this complicates all facets of care.   DVT prophylaxis: Lovenox Code Status: Full code Family Communication: Updated patient extensively at bedside  Disposition Plan:  Status is: Observation  The patient will require care spanning > 2 midnights and should be moved to inpatient because: Persistent severe electrolyte disturbances, Unsafe d/c plan, IV treatments appropriate due to intensity of illness or inability to take PO and Inpatient level of care appropriate due to severity of illness  Dispo: The patient is from: Home              Anticipated d/c is to: Home              Anticipated d/c  date is: 1 day              Patient currently is not medically stable to d/c.   Consultants:   none  Procedures:   None  Antimicrobials:    None   Subjective: Patient seen and examined at bedside, lying in bed.  Remains on room air, but significant shortness of breath with minimal exertion with worsening of her cough.  During that if she returned home at this time without her continued neb treatments that she would end up returning to the ED due to her significant shortness of breath with any type of movement associated with severe cough. No other complaints or concerns at this time.  Denies headache, no visual changes, no chest pain, no palpitations, no abdominal pain, no fever/chills/night sweats, no nausea/vomiting/diarrhea.  No acute events overnight per nursing staff.   Objective: Vitals:   05/23/20 0305 05/23/20 0509 05/23/20 0806 05/23/20 0930  BP:  117/70  139/86  Pulse:  74  77  Resp:  18    Temp:  98.4 F (36.9 C)  98.3 F (36.8 C)  TempSrc:  Oral  Oral  SpO2: 95% 96% 94% 93%  Weight:      Height:        Intake/Output Summary (Last 24 hours) at 05/23/2020 1039 Last data filed at 05/22/2020 2202 Gross per 24 hour  Intake 3 ml  Output -  Net 3 ml   Filed Weights   05/18/20 1425  Weight: 86.2 kg    Examination:  General exam: Appears calm and comfortable, ill in appearance Respiratory system: Diffuse wheezing bilaterally in all lung zones, normal respiratory effort without accessory muscle use, significant upper respiratory sounds on auscultation, on room air with SPO2 96% Cardiovascular system: S1 & S2 heard, RRR. No JVD, murmurs, rubs, gallops or clicks. No pedal edema. Gastrointestinal system: Abdomen is nondistended, soft and nontender. No organomegaly or masses felt. Normal bowel sounds heard. Central nervous system: Alert and oriented. No focal neurological deficits. Extremities: Symmetric 5 x 5 power. Skin: No rashes, lesions or ulcers Psychiatry: Judgement and insight appear normal. Mood & affect appropriate.     Data Reviewed: I have personally reviewed following labs and imaging  studies  CBC: Recent Labs  Lab 05/19/20 0515 05/20/20 0326 05/21/20 0109  WBC 13.2* 9.9 9.7  NEUTROABS 12.0*  --   --   HGB 13.8 12.0 12.2  HCT 40.9 36.8 37.7  MCV 79.9* 80.0 80.2  PLT 281 249 578   Basic Metabolic Panel: Recent Labs  Lab 05/19/20 0515 05/20/20 0326 05/21/20 0109 05/22/20 0520 05/23/20 0111  NA 137 137 138 138 138  K 3.9 3.2* 3.2* 3.1* 3.3*  CL 105 104 109 108 108  CO2 19* 23 23 20* 22  GLUCOSE 174* 161* 166* 148* 167*  BUN 22* 17 21* 19 23*  CREATININE 0.85 0.74 0.90 0.60 0.76  CALCIUM 9.6 9.4 9.5 9.3 9.6  MG  --   --  2.4 2.4 2.4   GFR: Estimated Creatinine Clearance: 84.4 mL/min (by C-G formula based on SCr of 0.76 mg/dL). Liver Function Tests: No results for input(s): AST, ALT, ALKPHOS, BILITOT, PROT, ALBUMIN in the last 168 hours. No results for input(s): LIPASE, AMYLASE in the last 168 hours. No results for input(s): AMMONIA in the last 168 hours. Coagulation Profile: No results for input(s): INR, PROTIME in the last 168 hours. Cardiac Enzymes: No results for input(s): CKTOTAL, CKMB, CKMBINDEX, TROPONINI in the last 168 hours. BNP (  last 3 results) No results for input(s): PROBNP in the last 8760 hours. HbA1C: No results for input(s): HGBA1C in the last 72 hours. CBG: No results for input(s): GLUCAP in the last 168 hours. Lipid Profile: No results for input(s): CHOL, HDL, LDLCALC, TRIG, CHOLHDL, LDLDIRECT in the last 72 hours. Thyroid Function Tests: No results for input(s): TSH, T4TOTAL, FREET4, T3FREE, THYROIDAB in the last 72 hours. Anemia Panel: No results for input(s): VITAMINB12, FOLATE, FERRITIN, TIBC, IRON, RETICCTPCT in the last 72 hours. Sepsis Labs: No results for input(s): PROCALCITON, LATICACIDVEN in the last 168 hours.  Recent Results (from the past 240 hour(s))  Novel Coronavirus, NAA ([facility])     Status: None   Collection Time: 05/17/20 11:09 AM   Specimen: Nasopharyngeal Swab; Nasopharyngeal(NP) swabs in vial  transport medium   Nasopharynge  Result Value Ref Range Status   SARS-CoV-2, NAA Not Detected Not Detected Final    Comment: This nucleic acid amplification test was developed and its performance characteristics determined by [employer]. Nucleic acid amplification tests include RT-PCR and TMA. This test has not been FDA cleared or approved. This test has been authorized by FDA under an Emergency Use Authorization (EUA). This test is only authorized for the duration of time the declaration that circumstances exist justifying the authorization of the emergency use of in vitro diagnostic tests for detection of SARS-CoV-2 virus and/or diagnosis of COVID-19 infection under section 564(b)(1) of the Act, 21 U.S.C. [id number]) (1), unless the authorization is terminated or revoked sooner. When diagnostic testing is negative, the possibility of a false negative result should be considered in the context of a patient's recent exposures and the presence of clinical signs and symptoms consistent with COVID-19. An individual without symptoms of COVID-19 and who is not shedding SARS-CoV-2 virus wo uld expect to have a negative (not detected) result in this assay.   SARS-COV-2, NAA 2 DAY TAT     Status: None   Collection Time: 05/17/20 11:09 AM   Nasopharynge  Result Value Ref Range Status   SARS-CoV-2, NAA 2 DAY TAT Performed  Final  Resp Panel by RT-PCR (Flu A&B, Covid) Nasopharyngeal Swab     Status: None   Collection Time: 05/19/20  1:34 AM   Specimen: Nasopharyngeal Swab; Nasopharyngeal(NP) swabs in vial transport medium  Result Value Ref Range Status   SARS Coronavirus 2 by RT PCR NEGATIVE NEGATIVE Final    Comment: (NOTE) SARS-CoV-2 target nucleic acids are NOT DETECTED.  The SARS-CoV-2 RNA is generally detectable in upper respiratory specimens during the acute phase of infection. The lowest concentration of SARS-CoV-2 viral copies this assay can detect is 138 copies/mL. A  negative result does not preclude SARS-Cov-2 infection and should not be used as the sole basis for treatment or other patient management decisions. A negative result may occur with  improper specimen collection/handling, submission of specimen other than nasopharyngeal swab, presence of viral mutation(s) within the areas targeted by this assay, and inadequate number of viral copies(<138 copies/mL). A negative result must be combined with clinical observations, patient history, and epidemiological information. The expected result is Negative.  Fact Sheet for Patients:  EntrepreneurPulse.com.au  Fact Sheet for Healthcare Providers:  IncredibleEmployment.be  This test is no t yet approved or cleared by the [place] FDA and  has been authorized for detection and/or diagnosis of SARS-CoV-2 by FDA under an Emergency Use Authorization (EUA). This EUA will remain  in effect (meaning this test can be used) for the duration of the COVID-19  declaration under Section 564(b)(1) of the Act, 21 U.S.C.section 360bbb-3(b)(1), unless the authorization is terminated  or revoked sooner.       Influenza A by PCR NEGATIVE NEGATIVE Final   Influenza B by PCR NEGATIVE NEGATIVE Final    Comment: (NOTE) The Xpert Xpress SARS-CoV-2/FLU/RSV plus assay is intended as an aid in the diagnosis of influenza from Nasopharyngeal swab specimens and should not be used as a sole basis for treatment. Nasal washings and aspirates are unacceptable for Xpert Xpress SARS-CoV-2/FLU/RSV testing.  Fact Sheet for Patients: EntrepreneurPulse.com.au  Fact Sheet for Healthcare Providers: IncredibleEmployment.be  This test is not yet approved or cleared by the [place] FDA and has been authorized for detection and/or diagnosis of SARS-CoV-2 by FDA under an Emergency Use Authorization (EUA). This EUA will remain in effect (meaning this test can  be used) for the duration of the COVID-19 declaration under Section 564(b)(1) of the Act, 21 U.S.C. section 360bbb-3(b)(1), unless the authorization is terminated or revoked.  Performed at [facility], [place] [address], [place], [place] [address]   Resp panel by RT-PCR (RSV, Flu A&B, Covid)     Status: Abnormal   Collection Time: 05/19/20  1:34 AM  Result Value Ref Range Status   SARS Coronavirus 2 by RT PCR NEGATIVE NEGATIVE Final   Influenza A by PCR NEGATIVE NEGATIVE Final   Influenza B by PCR NEGATIVE NEGATIVE Final   Resp Syncytial Virus by PCR POSITIVE (A) NEGATIVE Final    Comment: Performed at [facility], [place] [address] [place], [place] [address]         Radiology Studies: No results found.      Scheduled Meds: . acetaZOLAMIDE  500 mg Oral BID  . amLODipine  5 mg Oral Daily  . atropine  1 drop Right Eye BID  . azaTHIOprine  100 mg Oral BID  . brimonidine  1 drop Both Eyes Q8H  . budesonide (PULMICORT) nebulizer solution  0.5 mg Nebulization BID  . dextromethorphan-guaiFENesin  1 tablet Oral BID  . dorzolamide-timolol  1 drop Both Eyes BID  . enoxaparin (LOVENOX) injection  40 mg Subcutaneous Q24H  . hydrochlorothiazide  25 mg Oral Daily  . ipratropium-albuterol  3 mL Nebulization Q6H  . latanoprost  1 drop Both Eyes QHS  . lisinopril  20 mg Oral Daily  . methylPREDNISolone (SOLU-MEDROL) injection  40 mg Intravenous Q12H  . multivitamin with minerals  1 tablet Oral Daily  . potassium chloride  40 mEq Oral Q3H  . prednisoLONE acetate  1 drop Left Eye Q2H while awake  . sodium chloride flush  3 mL Intravenous Q12H   Continuous Infusions:    LOS: 3 days    Time spent: 37 minutes spent on chart review, discussion with nursing staff, consultants, updating family and interview/physical exam; more than 50% of that time was spent in counseling and/or coordination of care.    Saron Tweed J [place] DO [facility] Available via Epic secure chat  7am-7pm After these hours, please refer to coverage provider listed on [email or web address] 05/23/2020, 10:39 AM

## 2020-05-24 LAB — BASIC METABOLIC PANEL
Anion gap: 8 (ref 5–15)
BUN: 21 mg/dL — ABNORMAL HIGH (ref 6–20)
CO2: 19 mmol/L — ABNORMAL LOW (ref 22–32)
Calcium: 9.4 mg/dL (ref 8.9–10.3)
Chloride: 108 mmol/L (ref 98–111)
Creatinine, Ser: 0.74 mg/dL (ref 0.44–1.00)
GFR, Estimated: >60 mL/min (ref >60)
Glucose, Bld: 209 mg/dL — ABNORMAL HIGH (ref 70–99)
Potassium: 3.6 mmol/L (ref 3.5–5.1)
Sodium: 135 mmol/L (ref 135–145)

## 2020-05-24 LAB — HEMOGLOBIN A1C
Hgb A1c MFr Bld: 5.6 % (ref 4.8–5.6)
Mean Plasma Glucose: 114.02 mg/dL

## 2020-05-24 LAB — GLUCOSE, CAPILLARY
Glucose-Capillary: 238 mg/dL — ABNORMAL HIGH (ref 70–99)
Glucose-Capillary: 270 mg/dL — ABNORMAL HIGH (ref 70–99)
Glucose-Capillary: 234 mg/dL — ABNORMAL HIGH (ref 70–99)

## 2020-05-24 MED ORDER — COVID-19 MRNA VACCINE (PFIZER) 30 MCG/0.3ML IM SUSP
0.3000 mL | Freq: Once | INTRAMUSCULAR | Status: AC
  Administered 2020-05-24: 0.3 mL via INTRAMUSCULAR
  Filled 2020-05-24: qty 0.3

## 2020-05-24 MED ORDER — POTASSIUM CHLORIDE CRYS ER 20 MEQ PO TBCR
40.0000 meq | EXTENDED_RELEASE_TABLET | Freq: Once | ORAL | Status: AC
  Administered 2020-05-24: 40 meq via ORAL
  Filled 2020-05-24: qty 2

## 2020-05-24 MED ORDER — INSULIN ASPART 100 UNIT/ML ~~LOC~~ SOLN
0.0000 [IU] | Freq: Three times a day (TID) | SUBCUTANEOUS | Status: DC
  Administered 2020-05-25: 3 [IU] via SUBCUTANEOUS

## 2020-05-24 NOTE — Progress Notes (Signed)
PROGRESS NOTE    Monica Estrada  VZD:[id number] DOB: [birth year] DOA: 05/18/2020 PCP: Burnis Medin, MD    Brief Narrative:  Monica Estrada is a 55 year old female with past medical history significant for reactive airway disease, essential hypertension, uveitis, hypothyroidism who presented to the ED with progressive shortness of breath.  Patient at home was starting to use her neb treatments without significant improvement.  Patient was seen at urgent care on Monday and was given albuterol and a steroid injection and was instructed to present to the ED if not feeling better.  Unfortunately, patient's symptoms continue to progress.  Patient with history of bronchitis 1-2 times a year but no history of asthma or COPD.  Denies tobacco use history.  Not on home O2.  Denies any fever/chills.  In the ED, temperature 97.6, HR 113, RR 35, BP 195/109, SPO2 96% on 2LNC.  Sodium 137, potassium 3.9, chloride 105, CO2 19, glucose 174, BUN 22, creatinine 0.85.  WBC count 13.2, hemoglobin 13.8, platelets 281.  Chest x-ray with low lung volumes, but otherwise no acute cardiopulmonary disease noted.  Covid-19 PCR/influenza A/B PCR negative.  RSV positive.  Patient was given duo nebs x2, 6 mg IV Solu-Medrol with no significant improvement of respiratory status.  Given patient requiring submental oxygen with significant wheezing, hospitalist service consulted for further evaluation and management of acute hypoxic respiratory failure secondary to RSV bronchiolitis.   Assessment & Plan:   Principal Problem:   RSV/bronchiolitis Active Problems:   Hyperlipemia   UVEITIS   BMI 32.0-32.9,adult   Essential hypertension   Acute respiratory failure with hypoxia (HCC)   Acute hypoxic respiratory failure, POA RSV bronchiolitis Patient presenting to ED following failed outpatient treatment with significant progression shortness of breath/wheezing.  Received albuterol and IM steroids outpatient without significant  improvement.  Chest x-ray unrevealing. COVID/Flu negative. RSV positive. Required 2LNC to maintain adequate oxygenation on admission, now titrated off. --Solu-Medrol 40 mg IV every 12 hours --Budesonide neb BID --DuoNeb every 6 hours scheduled --Albuterol neb q2h prn --Continue supplemental oxygen, maintain SPO2 greater than 92%, now weaned to room air --Supportive care, mucolytic's  Hypokalemia Potassium 3.6 today magnesium 2.4, will replete potassium. --Repeat electrolytes in a.m. to include magnesium  Essential hypertension BP 124/73 this morning --Lisinopril 20 mg p.o. daily --HCTZ 25 mg p.o. daily --Amlodipine 5 mg p.o. daily --Acetazolamide 500 mg p.o. 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  Has been treated with chemotherapy/immune suppression since June 2020.  Plan to start Humira, pending insurance authorization. --Azathioprine --Continue home eyedrops  Obesity Body mass index is 32.61 kg/m.  Discussed need for aggressive lifestyle changes/weight loss as this complicates all facets of care.   DVT prophylaxis: Lovenox Code Status: Full code Family Communication: Updated patient extensively at bedside  Disposition Plan:  Status is: Observation  The patient will require care spanning > 2 midnights and should be moved to inpatient because: Persistent severe electrolyte disturbances, Unsafe d/c plan, IV treatments appropriate due to intensity of illness or inability to take PO and Inpatient level of care appropriate due to severity of illness  Dispo: The patient is from: Home              Anticipated d/c is to: Home              Anticipated d/c date is: 1 day  Patient currently is not medically stable to d/c.   Consultants:   none  Procedures:   None  Antimicrobials:   None   Subjective: Patient seen and  examined at bedside, lying in bed.  Remains on room air, continues with significant dyspnea and coughing with mild exertion.  Repeat ambulatory O2 screening yesterday stable with no significant desaturation.  No other complaints or concerns at this time.  Denies headache, no visual changes, no chest pain, no palpitations, no abdominal pain, no fever/chills/night sweats, no nausea/vomiting/diarrhea.  No acute events overnight per nursing staff.   Objective: Vitals:   05/23/20 2011 05/24/20 0316 05/24/20 0509 05/24/20 0751  BP:   124/73   Pulse:   79   Resp:   20   Temp:   97.7 F (36.5 C)   TempSrc:   Oral   SpO2: 94% 96% 98% 94%  Weight:      Height:        Intake/Output Summary (Last 24 hours) at 05/24/2020 1229 Last data filed at 05/23/2020 2207 Gross per 24 hour  Intake 3 ml  Output --  Net 3 ml   Filed Weights   05/18/20 1425  Weight: 86.2 kg    Examination:  General exam: Appears calm and comfortable, ill in appearance Respiratory system: Diffuse wheezing bilaterally in all lung zones, normal respiratory effort without accessory muscle use, significant upper respiratory sounds on auscultation, on room air with SPO2 96% Cardiovascular system: S1 & S2 heard, RRR. No JVD, murmurs, rubs, gallops or clicks. No pedal edema. Gastrointestinal system: Abdomen is nondistended, soft and nontender. No organomegaly or masses felt. Normal bowel sounds heard. Central nervous system: Alert and oriented. No focal neurological deficits. Extremities: Symmetric 5 x 5 power. Skin: No rashes, lesions or ulcers Psychiatry: Judgement and insight appear normal. Mood & affect appropriate.     Data Reviewed: I have personally reviewed following labs and imaging studies  CBC: Recent Labs  Lab 05/19/20 0515 05/20/20 0326 05/21/20 0109  WBC 13.2* 9.9 9.7  NEUTROABS 12.0*  --   --   HGB 13.8 12.0 12.2  HCT 40.9 36.8 37.7  MCV 79.9* 80.0 80.2  PLT 281 249 [id number]   Basic Metabolic  Panel: Recent Labs  Lab 05/20/20 0326 05/21/20 0109 05/22/20 0520 05/23/20 0111 05/24/20 0357  NA 137 138 138 138 135  K 3.2* 3.2* 3.1* 3.3* 3.6  CL 104 109 108 108 108  CO2 23 23 20* 22 19*  GLUCOSE 161* 166* 148* 167* 209*  BUN 17 21* 19 23* 21*  CREATININE 0.74 0.90 0.60 0.76 0.74  CALCIUM 9.4 9.5 9.3 9.6 9.4  MG  --  2.4 2.4 2.4  --    GFR: Estimated Creatinine Clearance: 84.4 mL/min (by C-G formula based on SCr of 0.74 mg/dL). Liver Function Tests: No results for input(s): AST, ALT, ALKPHOS, BILITOT, PROT, ALBUMIN in the last 168 hours. No results for input(s): LIPASE, AMYLASE in the last 168 hours. No results for input(s): AMMONIA in the last 168 hours. Coagulation Profile: No results for input(s): INR, PROTIME in the last 168 hours. Cardiac Enzymes: No results for input(s): CKTOTAL, CKMB, CKMBINDEX, TROPONINI in the last 168 hours. BNP (last 3 results) No results for input(s): PROBNP in the last 8760 hours. HbA1C: No results for input(s): HGBA1C in the last 72 hours. CBG: No results for input(s): GLUCAP in the last 168 hours. Lipid Profile: No results for input(s): CHOL, HDL, LDLCALC, TRIG, CHOLHDL, LDLDIRECT in the last 72 hours.  Thyroid Function Tests: No results for input(s): TSH, T4TOTAL, FREET4, T3FREE, THYROIDAB in the last 72 hours. Anemia Panel: No results for input(s): VITAMINB12, FOLATE, FERRITIN, TIBC, IRON, RETICCTPCT in the last 72 hours. Sepsis Labs: No results for input(s): PROCALCITON, LATICACIDVEN in the last 168 hours.  Recent Results (from the past 240 hour(s))  Novel Coronavirus, NAA ([facility])     Status: None   Collection Time: 05/17/20 11:09 AM   Specimen: Nasopharyngeal Swab; Nasopharyngeal(NP) swabs in vial transport medium   Nasopharynge  Result Value Ref Range Status   SARS-CoV-2, NAA Not Detected Not Detected Final    Comment: This nucleic acid amplification test was developed and its performance characteristics determined by  [employer]. Nucleic acid amplification tests include RT-PCR and TMA. This test has not been FDA cleared or approved. This test has been authorized by FDA under an Emergency Use Authorization (EUA). This test is only authorized for the duration of time the declaration that circumstances exist justifying the authorization of the emergency use of in vitro diagnostic tests for detection of SARS-CoV-2 virus and/or diagnosis of COVID-19 infection under section 564(b)(1) of the Act, 21 U.S.C. [id number]) (1), unless the authorization is terminated or revoked sooner. When diagnostic testing is negative, the possibility of a false negative result should be considered in the context of a patient's recent exposures and the presence of clinical signs and symptoms consistent with COVID-19. An individual without symptoms of COVID-19 and who is not shedding SARS-CoV-2 virus wo uld expect to have a negative (not detected) result in this assay.   SARS-COV-2, NAA 2 DAY TAT     Status: None   Collection Time: 05/17/20 11:09 AM   Nasopharynge  Result Value Ref Range Status   SARS-CoV-2, NAA 2 DAY TAT Performed  Final  Resp Panel by RT-PCR (Flu A&B, Covid) Nasopharyngeal Swab     Status: None   Collection Time: 05/19/20  1:34 AM   Specimen: Nasopharyngeal Swab; Nasopharyngeal(NP) swabs in vial transport medium  Result Value Ref Range Status   SARS Coronavirus 2 by RT PCR NEGATIVE NEGATIVE Final    Comment: (NOTE) SARS-CoV-2 target nucleic acids are NOT DETECTED.  The SARS-CoV-2 RNA is generally detectable in upper respiratory specimens during the acute phase of infection. The lowest concentration of SARS-CoV-2 viral copies this assay can detect is 138 copies/mL. A negative result does not preclude SARS-Cov-2 infection and should not be used as the sole basis for treatment or other patient management decisions. A negative result may occur with  improper specimen collection/handling,  submission of specimen other than nasopharyngeal swab, presence of viral mutation(s) within the areas targeted by this assay, and inadequate number of viral copies(<138 copies/mL). A negative result must be combined with clinical observations, patient history, and epidemiological information. The expected result is Negative.  Fact Sheet for Patients:  EntrepreneurPulse.com.au  Fact Sheet for Healthcare Providers:  IncredibleEmployment.be  This test is no t yet approved or cleared by the [place] FDA and  has been authorized for detection and/or diagnosis of SARS-CoV-2 by FDA under an Emergency Use Authorization (EUA). This EUA will remain  in effect (meaning this test can be used) for the duration of the COVID-19 declaration under Section 564(b)(1) of the Act, 21 U.S.C.section 360bbb-3(b)(1), unless the authorization is terminated  or revoked sooner.       Influenza A by PCR NEGATIVE NEGATIVE Final   Influenza B by PCR NEGATIVE NEGATIVE Final    Comment: (NOTE) The Xpert Xpress SARS-CoV-2/FLU/RSV plus assay  is intended as an aid in the diagnosis of influenza from Nasopharyngeal swab specimens and should not be used as a sole basis for treatment. Nasal washings and aspirates are unacceptable for Xpert Xpress SARS-CoV-2/FLU/RSV testing.  Fact Sheet for Patients: EntrepreneurPulse.com.au  Fact Sheet for Healthcare Providers: IncredibleEmployment.be  This test is not yet approved or cleared by the [place] FDA and has been authorized for detection and/or diagnosis of SARS-CoV-2 by FDA under an Emergency Use Authorization (EUA). This EUA will remain in effect (meaning this test can be used) for the duration of the COVID-19 declaration under Section 564(b)(1) of the Act, 21 U.S.C. section 360bbb-3(b)(1), unless the authorization is terminated or revoked.  Performed at [facility], [place] [address], [place], [place] [address]   Resp panel by RT-PCR (RSV, Flu A&B, Covid)     Status: Abnormal   Collection Time: 05/19/20  1:34 AM  Result Value Ref Range Status   SARS Coronavirus 2 by RT PCR NEGATIVE NEGATIVE Final   Influenza A by PCR NEGATIVE NEGATIVE Final   Influenza B by PCR NEGATIVE NEGATIVE Final   Resp Syncytial Virus by PCR POSITIVE (A) NEGATIVE Final    Comment: Performed at [facility], [place] [address] [place], [place] [address]         Radiology Studies: No results found.      Scheduled Meds: . acetaZOLAMIDE  500 mg Oral BID  . amLODipine  5 mg Oral Daily  . atropine  1 drop Right Eye BID  . azaTHIOprine  100 mg Oral BID  . brimonidine  1 drop Both Eyes Q8H  . budesonide (PULMICORT) nebulizer solution  0.5 mg Nebulization BID  . COVID-19 mRNA vaccine ([employer])  0.3 mL Intramuscular Once  . dextromethorphan-guaiFENesin  1 tablet Oral BID  . dorzolamide-timolol  1 drop Both Eyes BID  . enoxaparin (LOVENOX) injection  40 mg Subcutaneous Q24H  . hydrochlorothiazide  25 mg Oral Daily  . insulin aspart  0-9 Units Subcutaneous TID WC  . ipratropium-albuterol  3 mL Nebulization Q6H  . latanoprost  1 drop Both Eyes QHS  . lisinopril  20 mg Oral Daily  . methylPREDNISolone (SOLU-MEDROL) injection  40 mg Intravenous Q12H  . multivitamin with minerals  1 tablet Oral Daily  . prednisoLONE acetate  1 drop Left Eye Q2H while awake  . sodium chloride flush  3 mL Intravenous Q12H   Continuous Infusions:    LOS: 4 days    Time spent: 35 minutes spent on chart review, discussion with nursing staff, consultants, updating family and interview/physical exam; more than 50% of that time was spent in counseling and/or coordination of care.    Neely Kammerer J [place] DO [facility] Available via Epic secure chat 7am-7pm After these hours, please refer to coverage provider listed on [email or web address] 05/24/2020, 12:29 PM

## 2020-05-25 LAB — GLUCOSE, CAPILLARY: Glucose-Capillary: 211 mg/dL — ABNORMAL HIGH (ref 70–99)

## 2020-05-25 MED ORDER — PREDNISONE 10 MG PO TABS: ORAL_TABLET | ORAL | 0 refills | Status: AC

## 2020-05-25 MED ORDER — DM-GUAIFENESIN ER 30-600 MG PO TB12: 1.0000 | ORAL_TABLET | Freq: Two times a day (BID) | ORAL | 0 refills | Status: AC

## 2020-05-25 MED ORDER — SPIRIVA HANDIHALER 18 MCG IN CAPS: 18.0000 ug | ORAL_CAPSULE | Freq: Every day | RESPIRATORY_TRACT | 3 refills | Status: AC

## 2020-05-25 MED ORDER — IPRATROPIUM-ALBUTEROL 0.5-2.5 (3) MG/3ML IN SOLN: 3.0000 mL | RESPIRATORY_TRACT | 0 refills | Status: DC | PRN

## 2020-05-25 MED ORDER — FLUTICASONE-SALMETEROL 250-50 MCG/DOSE IN AEPB: 1.0000 | INHALATION_SPRAY | Freq: Two times a day (BID) | RESPIRATORY_TRACT | 3 refills | Status: AC

## 2020-05-25 MED ORDER — HYDROCOD POLST-CPM POLST ER 10-8 MG/5ML PO SUER: 5.0000 mL | Freq: Two times a day (BID) | ORAL | 0 refills | Status: DC | PRN

## 2020-05-25 NOTE — Discharge Summary (Addendum)
Physician Discharge Summary  Cleon Estrada [id number] DOB: 1966/01/20 DOA: 05/18/2020  PCP: Burnis Medin, MD  Admit date: 05/18/2020 Discharge date: 05/25/2020  Admitted From: Home Disposition: Home  Recommendations for Outpatient Follow-up:  1. Follow up with PCP in 1-2 weeks 2. Started on Advair Diskus and Spiriva 3. Prednisone taper 4. Duo nebs as needed for shortness of breath/wheezing 5. Mucinex as needed for congestion 6. Tussionex as needed for cough 7. Repeat BMP to assess potassium level and next PCP visit  Home Health: No Equipment/Devices: Home nebulizer machine  Discharge Condition: Stable CODE STATUS: Full code Diet recommendation: Heart healthy diet  History of present illness:  Monica Estrada is a 55 year old female with past medical history significant for reactive airway disease, essential hypertension, uveitis, hypothyroidism who presented to the ED with progressive shortness of breath.  Patient at home was starting to use her neb treatments without significant improvement.  Patient was seen at urgent care on Monday and was given albuterol and a steroid injection and was instructed to present to the ED if not feeling better.  Unfortunately, patient's symptoms continue to progress.  Patient with history of bronchitis 1-2 times a year but no history of asthma or COPD.  Denies tobacco use history.  Not on home O2.  Denies any fever/chills.  In the ED, temperature 97.6, HR 113, RR 35, BP 195/109, SPO2 96% on 2LNC.  Sodium 137, potassium 3.9, chloride 105, CO2 19, glucose 174, BUN 22, creatinine 0.85.  WBC count 13.2, hemoglobin 13.8, platelets 281.  Chest x-ray with low lung volumes, but otherwise no acute cardiopulmonary disease noted.  Covid-19 PCR/influenza A/B PCR negative.  RSV positive.  Patient was given duo nebs x2, 6 mg IV Solu-Medrol with no significant improvement of respiratory status.  Given patient requiring submental oxygen with significant wheezing,  hospitalist service consulted for further evaluation and management of acute hypoxic respiratory failure secondary to RSV bronchiolitis.  Hospital course:  Acute hypoxic respiratory failure, POA RSV bronchiolitis Patient presenting to ED following failed outpatient treatment with significant progression shortness of breath/wheezing.  Received albuterol and IM steroids outpatient without significant improvement.  Chest x-ray unrevealing. COVID/Flu negative. RSV positive. Required 2LNC to maintain adequate oxygenation on admission, now titrated off.  Patient was continued on IV steroids and will continue prednisone taper at discharge.  While inpatient, patient was supported with budesonide and duo nebs schedule and will continue Advair Diskus and Spiriva on discharge.  Albuterol MDI as needed.  Continue mucolytic's with Mucinex and cough suppressant with Tussionex as needed.  Outpatient follow-up with PCP.  Hypokalemia Repleted during hospitalization.  Recommend repeat BMP in next PCP visit.  Essential hypertension Continue home lisinopril 20 mg p.o. daily, HCTZ 25 mg p.o. daily, Amlodipine 5 mg p.o. daily, Acetazolamide 500 mg p.o. 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  Has been treated with chemotherapy/immune suppression since June 2020.  Plan to start Humira, pending insurance authorization. Continue Azathioprine and home eyedrops  Obesity Body mass index is 32.61 kg/m.  Discussed need for aggressive lifestyle changes/weight loss as this complicates all facets of care.  Discharge Diagnoses:  Principal Problem:   RSV bronchiolitis Active Problems:   Hyperlipemia   UVEITIS   BMI 32.0-32.9,adult   Essential hypertension    Discharge Instructions  Discharge Instructions    Call MD for:  difficulty breathing, headache or visual disturbances   Complete by: As directed    Call MD  for:  extreme fatigue   Complete  by: As directed    Call MD for:  persistant dizziness or light-headedness   Complete by: As directed    Call MD for:  persistant nausea and vomiting   Complete by: As directed    Call MD for:  severe uncontrolled pain   Complete by: As directed    Call MD for:  temperature >100.4   Complete by: As directed    Diet - low sodium heart healthy   Complete by: As directed    Increase activity slowly   Complete by: As directed      Allergies as of 05/25/2020   No Known Allergies     Medication List    STOP taking these medications   benzonatate 200 MG capsule Commonly known as: TESSALON   Flovent HFA 110 MCG/ACT inhaler Generic drug: fluticasone     TAKE these medications   acetaZOLAMIDE 250 MG tablet Commonly known as: DIAMOX Take 500 mg by mouth in the morning and at bedtime.   albuterol 108 (90 Base) MCG/ACT inhaler Commonly known as: VENTOLIN HFA Inhale 2 puffs into the lungs every 4 (four) hours as needed for wheezing or shortness of breath.   amLODipine 5 MG tablet Commonly known as: NORVASC Take 1 tablet (5 mg total) by mouth daily.   atropine 1 % ophthalmic solution Place 1 drop into the right eye 2 (two) times daily.   azaTHIOprine 50 MG tablet Commonly known as: IMURAN Take 100 mg by mouth in the morning and at bedtime.   brimonidine 0.2 % ophthalmic solution Commonly known as: ALPHAGAN Place 1 drop into both eyes every 8 (eight) hours.   chlorpheniramine-HYDROcodone 10-8 MG/5ML Suer Commonly known as: TUSSIONEX Take 5 mLs by mouth every 12 (twelve) hours as needed for cough.   dextromethorphan-guaiFENesin 30-600 MG 12hr tablet Commonly known as: MUCINEX DM Take 1 tablet by mouth 2 (two) times daily.   dorzolamide-timolol 22.3-6.8 MG/ML ophthalmic solution Commonly known as: COSOPT Place 1 drop into both eyes in the morning and at bedtime.   ferrous sulfate 325 (65 FE) MG tablet Take 650 mg by mouth daily with breakfast.   Fluticasone-Salmeterol  250-50 MCG/DOSE Aepb Commonly known as: Advair Diskus Inhale 1 puff into the lungs in the morning and at bedtime.   ipratropium-albuterol 0.5-2.5 (3) MG/3ML Soln Commonly known as: DUONEB Take 3 mLs by nebulization every 4 (four) hours as needed (shortness of breath/wheezing).   latanoprost 0.005 % ophthalmic solution Commonly known as: XALATAN Place 1 drop into both eyes at bedtime.   lisinopril-hydrochlorothiazide 20-25 MG tablet Commonly known as: ZESTORETIC Take 1 tablet by mouth daily.   nystatin ointment Commonly known as: MYCOSTATIN Apply 1 application topically in the morning and at bedtime.   prednisoLONE acetate 1 % ophthalmic suspension Commonly known as: PRED FORTE Place 1 drop into the left eye every 2 (two) hours. When awake.   predniSONE 10 MG tablet Commonly known as: DELTASONE Take 5 tablets (50 mg total) by mouth daily for 3 days, THEN 4 tablets (40 mg total) daily for 3 days, THEN 3 tablets (30 mg total) daily for 3 days, THEN 2 tablets (20 mg total) daily for 3 days, THEN 1 tablet (10 mg total) daily for 3 days. Start taking on: May 26, 2020 What changed:   medication strength  See the new instructions.   Spiriva HandiHaler 18 MCG inhalation capsule Generic drug: tiotropium Place 1 capsule (18 mcg total) into inhaler and inhale  daily.   triamcinolone ointment 0.1 % Commonly known as: KENALOG Apply 1 application topically in the morning and at bedtime.            Durable Medical Equipment  (From admission, onward)         Start     Ordered   05/25/20 1049  For home use only DME Nebulizer machine  Once       Question Answer Comment  Patient needs a nebulizer to treat with the following condition RSV bronchiolitis   Length of Need 6 Months      05/25/20 1049   05/25/20 1049  For home use only DME Nebulizer/meds  Once       Question Answer Comment  Patient needs a nebulizer to treat with the following condition RSV bronchiolitis    Length of Need 6 Months      05/25/20 1049          Follow-up Information    Panosh, Standley Brooking, MD. Schedule an appointment as soon as possible for a visit in 1 week(s).   Specialties: Internal Medicine, Pediatrics Contact information: [place] [address] [phone]              No Known Allergies  Consultations:  None   Procedures/Studies: DG Chest 2 View  Result Date: 05/19/2020 CLINICAL DATA:  Shortness of breath. EXAM: CHEST - 2 VIEW COMPARISON:  05/17/2020. FINDINGS: Mediastinum and hilar structures normal. Heart size normal. Low lung volumes. No pleural effusion or pneumothorax. Thoracic spine scoliosis. No acute bony abnormality. IMPRESSION: Low lung volumes.  No acute cardiopulmonary disease identified. Electronically Signed   By: Marcello Moores  Register   On: 05/19/2020 05:35   DG Chest 2 View  Result Date: 05/17/2020 CLINICAL DATA:  Wheezing, cough, and shortness of breath for 4 days, O2 saturation 92%, history hypertension EXAM: CHEST - 2 VIEW COMPARISON:  03/15/2020 FINDINGS: Normal heart size, mediastinal contours, and pulmonary vascularity. Lungs clear. No infiltrate, pleural effusion, or pneumothorax. Biconvex thoracic scoliosis. IMPRESSION: No acute abnormalities. Electronically Signed   By: Lavonia Dana M.D.   On: 05/17/2020 10:44     Subjective: Patient seen examined bedside, resting comfortably.  Sitting in bedside chair.  Breathing much improved and continues off of supplemental oxygen.  Continues with some mild wheezing.  Ready for discharge home.  Denies headache, no fever/chills/night sweats, no nausea/vomiting/diarrhea, no chest pain, palpitations, no abdominal pain, no weakness, no fatigue, no paresthesias.  No acute events overnight per nursing staff.  Discharge Exam: Vitals:   05/25/20 0711 05/25/20 0719  BP:    Pulse:  84  Resp: 18 17  Temp:  (!) 97.3 F (36.3 C)  SpO2:  98%   Vitals:   05/25/20 0253 05/25/20 0551  05/25/20 0711 05/25/20 0719  BP:  115/65    Pulse:  80  84  Resp:  15 18 17   Temp:  97.8 F (36.6 C)  (!) 97.3 F (36.3 C)  TempSrc:  Oral    SpO2: 98% 95%  98%  Weight:      Height:        General: Pt is alert, awake, not in acute distress Cardiovascular: RRR, S1/S2 +, no rubs, no gallops Respiratory: Diffuse late expiratory wheezing bilaterally, normal respiratory effort, no accessory muscle use, actually well on room air Abdominal: Soft, NT, ND, bowel sounds + Extremities: no edema, no cyanosis    The results of significant diagnostics from this hospitalization (including imaging, microbiology, ancillary and  laboratory) are listed below for reference.     Microbiology: Recent Results (from the past 240 hour(s))  Novel Coronavirus, NAA ([facility])     Status: None   Collection Time: 05/17/20 11:09 AM   Specimen: Nasopharyngeal Swab; Nasopharyngeal(NP) swabs in vial transport medium   Nasopharynge  Result Value Ref Range Status   SARS-CoV-2, NAA Not Detected Not Detected Final    Comment: This nucleic acid amplification test was developed and its performance characteristics determined by [employer], [employer] and [employer]. Nucleic acid amplification tests include RT-PCR and TMA. This test has not been FDA cleared or approved. This test has been authorized by FDA under an Emergency Use Authorization (EUA). This test is only authorized for the duration of time the declaration that circumstances exist justifying the authorization of the emergency use of in vitro diagnostic tests for detection of SARS-CoV-2 virus and/or diagnosis of COVID-19 infection under section 564(b)(1) of the Act, 21 U.S.C. [id number]) (1), unless the authorization is terminated or revoked sooner. When diagnostic testing is negative, the possibility of a false negative result should be considered in the context of a patient's recent exposures and the presence of clinical signs and symptoms consistent with COVID-19.  An individual without symptoms of COVID-19 and who is not shedding SARS-CoV-2 virus wo uld expect to have a negative (not detected) result in this assay.   SARS-COV-2, NAA 2 DAY TAT     Status: None   Collection Time: 05/17/20 11:09 AM   Nasopharynge  Result Value Ref Range Status   SARS-CoV-2, NAA 2 DAY TAT Performed  Final  Resp Panel by RT-PCR (Flu A&B, Covid) Nasopharyngeal Swab     Status: None   Collection Time: 05/19/20  1:34 AM   Specimen: Nasopharyngeal Swab; Nasopharyngeal(NP) swabs in vial transport medium  Result Value Ref Range Status   SARS Coronavirus 2 by RT PCR NEGATIVE NEGATIVE Final    Comment: (NOTE) SARS-CoV-2 target nucleic acids are NOT DETECTED.  The SARS-CoV-2 RNA is generally detectable in upper respiratory specimens during the acute phase of infection. The lowest concentration of SARS-CoV-2 viral copies this assay can detect is 138 copies/mL. A negative result does not preclude SARS-Cov-2 infection and should not be used as the sole basis for treatment or other patient management decisions. A negative result may occur with  improper specimen collection/handling, submission of specimen other than nasopharyngeal swab, presence of viral mutation(s) within the areas targeted by this assay, and inadequate number of viral copies(<138 copies/mL). A negative result must be combined with clinical observations, patient history, and epidemiological information. The expected result is Negative.  Fact Sheet for Patients:  EntrepreneurPulse.com.au  Fact Sheet for Healthcare Providers:  IncredibleEmployment.be  This test is no t yet approved or cleared by the [place] FDA and  has been authorized for detection and/or diagnosis of SARS-CoV-2 by FDA under an Emergency Use Authorization (EUA). This EUA will remain  in effect (meaning this test can be used) for the duration of the COVID-19 declaration under Section 564(b)(1) of  the Act, 21 U.S.C.section 360bbb-3(b)(1), unless the authorization is terminated  or revoked sooner.       Influenza A by PCR NEGATIVE NEGATIVE Final   Influenza B by PCR NEGATIVE NEGATIVE Final    Comment: (NOTE) The Xpert Xpress SARS-CoV-2/FLU/RSV plus assay is intended as an aid in the diagnosis of influenza from Nasopharyngeal swab specimens and should not be used as a sole basis for treatment. Nasal washings and aspirates are unacceptable for Xpert Xpress SARS-CoV-2/FLU/RSV testing.  Fact Sheet for Patients: EntrepreneurPulse.com.au  Fact Sheet for Healthcare Providers: IncredibleEmployment.be  This test is not yet approved or cleared by the [place] FDA and has been authorized for detection and/or diagnosis of SARS-CoV-2 by FDA under an Emergency Use Authorization (EUA). This EUA will remain in effect (meaning this test can be used) for the duration of the COVID-19 declaration under Section 564(b)(1) of the Act, 21 U.S.C. section 360bbb-3(b)(1), unless the authorization is terminated or revoked.  Performed at [facility], [place] [address], [place], [place] [address]   Resp panel by RT-PCR (RSV, Flu A&B, Covid)     Status: Abnormal   Collection Time: 05/19/20  1:34 AM  Result Value Ref Range Status   SARS Coronavirus 2 by RT PCR NEGATIVE NEGATIVE Final   Influenza A by PCR NEGATIVE NEGATIVE Final   Influenza B by PCR NEGATIVE NEGATIVE Final   Resp Syncytial Virus by PCR POSITIVE (A) NEGATIVE Final    Comment: Performed at [facility], [place] [address] [place], [place] [address]     Labs: BNP (last 3 results) No results for input(s): BNP in the last 8760 hours. Basic Metabolic Panel: Recent Labs  Lab 05/20/20 0326 05/21/20 0109 05/22/20 0520 05/23/20 0111 05/24/20 0357  NA 137 138 138 138 135  K 3.2* 3.2* 3.1* 3.3* 3.6  CL 104 109 108 108 108  CO2 23 23 20* 22 19*  GLUCOSE 161* 166* 148* 167* 209*  BUN 17  21* 19 23* 21*  CREATININE 0.74 0.90 0.60 0.76 0.74  CALCIUM 9.4 9.5 9.3 9.6 9.4  MG  --  2.4 2.4 2.4  --    Liver Function Tests: No results for input(s): AST, ALT, ALKPHOS, BILITOT, PROT, ALBUMIN in the last 168 hours. No results for input(s): LIPASE, AMYLASE in the last 168 hours. No results for input(s): AMMONIA in the last 168 hours. CBC: Recent Labs  Lab 05/19/20 0515 05/20/20 0326 05/21/20 0109  WBC 13.2* 9.9 9.7  NEUTROABS 12.0*  --   --   HGB 13.8 12.0 12.2  HCT 40.9 36.8 37.7  MCV 79.9* 80.0 80.2  PLT 281 249 260   Cardiac Enzymes: No results for input(s): CKTOTAL, CKMB, CKMBINDEX, TROPONINI in the last 168 hours. BNP: Invalid input(s): POCBNP CBG: Recent Labs  Lab 05/24/20 1255 05/24/20 1655 05/24/20 2155 05/25/20 0728  GLUCAP 238* 270* 234* 211*   D-Dimer No results for input(s): DDIMER in the last 72 hours. Hgb A1c Recent Labs    05/24/20 1805  HGBA1C 5.6   Lipid Profile No results for input(s): CHOL, HDL, LDLCALC, TRIG, CHOLHDL, LDLDIRECT in the last 72 hours. Thyroid function studies No results for input(s): TSH, T4TOTAL, T3FREE, THYROIDAB in the last 72 hours.  Invalid input(s): FREET3 Anemia work up No results for input(s): VITAMINB12, FOLATE, FERRITIN, TIBC, IRON, RETICCTPCT in the last 72 hours. Urinalysis    Component Value Date/Time   COLORURINE YELLOW 06/28/2012 1726   APPEARANCEUR CLEAR 06/28/2012 1726   LABSPEC 1.014 06/28/2012 1726   PHURINE 6.0 06/28/2012 1726   GLUCOSEU NEGATIVE 06/28/2012 1726   HGBUR NEGATIVE 06/28/2012 1726   HGBUR negative 10/28/2009 1000   BILIRUBINUR NEGATIVE 06/28/2012 1726   BILIRUBINUR n 11/29/2011 1121   KETONESUR NEGATIVE 06/28/2012 1726   PROTEINUR NEGATIVE 06/28/2012 1726   UROBILINOGEN 0.2 06/28/2012 1726   NITRITE NEGATIVE 06/28/2012 1726   LEUKOCYTESUR NEGATIVE 06/28/2012 1726   Sepsis Labs Invalid input(s): PROCALCITONIN,  WBC,  LACTICIDVEN Microbiology Recent Results (from the  past  240 hour(s))  Novel Coronavirus, NAA ([facility])     Status: None   Collection Time: 05/17/20 11:09 AM   Specimen: Nasopharyngeal Swab; Nasopharyngeal(NP) swabs in vial transport medium   Nasopharynge  Result Value Ref Range Status   SARS-CoV-2, NAA Not Detected Not Detected Final    Comment: This nucleic acid amplification test was developed and its performance characteristics determined by [employer], [employer] and [employer]. Nucleic acid amplification tests include RT-PCR and TMA. This test has not been FDA cleared or approved. This test has been authorized by FDA under an Emergency Use Authorization (EUA). This test is only authorized for the duration of time the declaration that circumstances exist justifying the authorization of the emergency use of in vitro diagnostic tests for detection of SARS-CoV-2 virus and/or diagnosis of COVID-19 infection under section 564(b)(1) of the Act, 21 U.S.C. [id number]) (1), unless the authorization is terminated or revoked sooner. When diagnostic testing is negative, the possibility of a false negative result should be considered in the context of a patient's recent exposures and the presence of clinical signs and symptoms consistent with COVID-19. An individual without symptoms of COVID-19 and who is not shedding SARS-CoV-2 virus wo uld expect to have a negative (not detected) result in this assay.   SARS-COV-2, NAA 2 DAY TAT     Status: None   Collection Time: 05/17/20 11:09 AM   Nasopharynge  Result Value Ref Range Status   SARS-CoV-2, NAA 2 DAY TAT Performed  Final  Resp Panel by RT-PCR (Flu A&B, Covid) Nasopharyngeal Swab     Status: None   Collection Time: 05/19/20  1:34 AM   Specimen: Nasopharyngeal Swab; Nasopharyngeal(NP) swabs in vial transport medium  Result Value Ref Range Status   SARS Coronavirus 2 by RT PCR NEGATIVE NEGATIVE Final    Comment: (NOTE) SARS-CoV-2 target nucleic acids are NOT DETECTED.  The SARS-CoV-2 RNA is generally  detectable in upper respiratory specimens during the acute phase of infection. The lowest concentration of SARS-CoV-2 viral copies this assay can detect is 138 copies/mL. A negative result does not preclude SARS-Cov-2 infection and should not be used as the sole basis for treatment or other patient management decisions. A negative result may occur with  improper specimen collection/handling, submission of specimen other than nasopharyngeal swab, presence of viral mutation(s) within the areas targeted by this assay, and inadequate number of viral copies(<138 copies/mL). A negative result must be combined with clinical observations, patient history, and epidemiological information. The expected result is Negative.  Fact Sheet for Patients:  EntrepreneurPulse.com.au  Fact Sheet for Healthcare Providers:  IncredibleEmployment.be  This test is no t yet approved or cleared by the [place] FDA and  has been authorized for detection and/or diagnosis of SARS-CoV-2 by FDA under an Emergency Use Authorization (EUA). This EUA will remain  in effect (meaning this test can be used) for the duration of the COVID-19 declaration under Section 564(b)(1) of the Act, 21 U.S.C.section 360bbb-3(b)(1), unless the authorization is terminated  or revoked sooner.       Influenza A by PCR NEGATIVE NEGATIVE Final   Influenza B by PCR NEGATIVE NEGATIVE Final    Comment: (NOTE) The Xpert Xpress SARS-CoV-2/FLU/RSV plus assay is intended as an aid in the diagnosis of influenza from Nasopharyngeal swab specimens and should not be used as a sole basis for treatment. Nasal washings and aspirates are unacceptable for Xpert Xpress SARS-CoV-2/FLU/RSV testing.  Fact Sheet for Patients: EntrepreneurPulse.com.au  Fact Sheet for Healthcare Providers: IncredibleEmployment.be  This test  is not yet approved or cleared by the [place]  and has been authorized for detection and/or diagnosis of SARS-CoV-2 by FDA under an Emergency Use Authorization (EUA). This EUA will remain in effect (meaning this test can be used) for the duration of the COVID-19 declaration under Section 564(b)(1) of the Act, 21 U.S.C. section 360bbb-3(b)(1), unless the authorization is terminated or revoked.  Performed at [facility], [place] [address] [place], [place] [address]   Resp panel by RT-PCR (RSV, Flu A&B, Covid)     Status: Abnormal   Collection Time: 05/19/20  1:34 AM  Result Value Ref Range Status   SARS Coronavirus 2 by RT PCR NEGATIVE NEGATIVE Final   Influenza A by PCR NEGATIVE NEGATIVE Final   Influenza B by PCR NEGATIVE NEGATIVE Final   Resp Syncytial Virus by PCR POSITIVE (A) NEGATIVE Final    Comment: Performed at [facility], [place] [address] [place], [place] [address]     Time coordinating discharge: Over 30 minutes  SIGNED:   Sharmayne Jablon J [place] DO  [facility] 05/25/2020, 10:51 AM

## 2020-05-25 NOTE — Discharge Instructions (Signed)
Respiratory Syncytial Virus Infection, Adult Respiratory syncytial virus (RSV) infection is an infection caused by RSV, a common virus. This virus is similar to viruses that cause the common cold and the flu. RSV infection can affect the nose, throat, windpipe, and lungs (respiratory system). When the infection is severe, it can cause:  Bronchiolitis. This condition causes inflammation of the air passages in the lungs (bronchioles).  Pneumonia. This condition causes inflammation of the air sacs in the lungs. RSV infection spreads from person to person (is contagious) through droplets from coughs and sneezes (respiratory secretions). This condition is rarely serious when it occurs in adults. What are the causes? This condition is caused by contact with RSV. This can happen by:  Breathing respiratory secretions from someone who has the infection.  Touching something that has been exposed to the virus (is contaminated) and then touching your mouth, nose, or eyes.  Coming in close contact with someone who has this infection. This may happen if you: ? Hug or kiss. ? Shake or hold hands. ? Eat or drink using the same dishes or utensils. What increases the risk? The following factors may make you more likely to develop this condition:  Being 2 years of age or older.  Having certain health conditions, including: ? A long-term (chronic) lung condition, such as chronic obstructive pulmonary disease (COPD). ? An immune system that is weak. This is your body's defense system. ? Down syndrome. ? Heart disease.  Working in a hospital or other health care facility.  Living in a long-term health care facility. RSV infections are most common from the months of November to April, but they can happen any time of year. What are the signs or symptoms? Symptoms of this condition include:  Having a runny nose.  Coughing. You may have a cough that brings up mucus (productive  cough).  Sneezing.  Having a fever.  Wanting to eat less than usual.  Breathing loudly (wheezing).  Having shortness of breath.  Having fluid build up in the lungs (respiratory distress). How is this diagnosed? This condition may be diagnosed based on:  Your symptoms.  Your medical history.  A physical exam.  A chest X-ray to rule out pneumonia.  Blood tests or tests of mucus from your lungs (sputum). These tests may be done for older adults.  A test of a sample of your respiratory secretions. How is this treated? In most cases, the RSV infection will go away after 1-2 weeks of caring for yourself at home.  Sometimes, RSV infection is severe and can cause bronchiolitis or pneumonia. If you develop one or both of these conditions, you may need to be treated in the hospital. You may be given:  Oxygen therapy.  Antiviral medicine.  Medicines to open your bronchioles (bronchodilators). Follow these instructions at home: Medicines  Take over-the-counter and prescription medicines only as told by your health care provider.  If you were prescribed an antiviral medicine, take it as told by your health care provider. Do not stop using the antiviral even if you start to feel better. Lifestyle  Eat a healthy diet.  Do not drink alcohol.  Do not use any products that contain nicotine or tobacco, such as cigarettes, e-cigarettes, and chewing tobacco. If you need help quitting, ask your health care provider.  Rest at home until your symptoms go away.  Return to your normal activities as told by your health care provider. Ask your health care provider what activities are safe for you.  General instructions  Drink enough fluid to keep your urine pale yellow.  Gargle with a salt-water mixture 3-4 times a day or as needed. To make a salt-water mixture, completely dissolve -1 tsp (3-6 g) of salt in 1 cup (237 mL) of warm water.  Keep all follow-up visits as told by your  health care provider. This is important.   How is this prevented? To prevent catching and spreading RSV:  Wash your hands often with soap and water for at least 20 seconds. If soap and water are not available, use hand sanitizer. Do not touch your face without first cleaning your hands.  Stay home if you have symptoms of the common cold or the flu.  Cover your nose and mouth when you cough or sneeze.  Avoid large groups of people.  Keep a safe distance of about 6 feet (1.8 m) from people who are coughing or sneezing.   Where to find more information  Centers for Disease Control and Prevention: http://www.wolf.info/ Contact a health care provider if:  Your symptoms get worse or have not changed after 2 weeks.  You have: ? A fever. ? Hot flashes, sweating, or chills that keep happening. ? A cough that brings up much more mucus than usual. ? A cough that brings up blood.  You feel: ? Very tired (lethargic). ? Confused. Get help right away if:  You have increased or severe trouble breathing.  You lose consciousness. These symptoms may represent a serious problem that is an emergency. Do not wait to see if the symptoms will go away. Get medical help right away. Call your local emergency services (911 in the [place] Do not drive yourself to the hospital. Summary  Respiratory syncytial virus (RSV) infection is an infection caused by RSV, a common virus. RSV infection can affect the nose, throat, windpipe, and lungs (respiratory system).  When the infection is severe, it can cause bronchiolitis or pneumonia.  Take over-the-counter and prescription medicines only as told by your health care provider.  Contact a health care provider if your symptoms get worse or have not changed after 2 weeks. This information is not intended to replace advice given to you by your health care provider. Make sure you discuss any questions you have with your health care provider. Document Revised: 02/19/2019  Document Reviewed: 02/19/2019 Elsevier Patient Education  [place].

## 2020-05-25 NOTE — Progress Notes (Signed)
CSW informed pt needs nebulizer.  CSW spoke with pt who does not have current DME company, agreeable to having nebulizer through [employer].  Pt does have ride home with son.  CSW spoke with Lacretia at [facility] and they will deliver to pt room. Lurline Idol, MSW, LCSW 1/11/202211:04 AM

## 2020-05-25 NOTE — Plan of Care (Signed)

## 2020-05-27 ENCOUNTER — Telehealth: Payer: Self-pay | Admitting: Internal Medicine

## 2020-05-27 NOTE — Telephone Encounter (Signed)
Transition Care Management Unsuccessful Follow-up Telephone Call  Date of discharge and from where:  [facility] on 05[facility]  Attempts:  1st Attempt  Reason for unsuccessful TCM follow-up call:  Left voice message

## 2020-06-08 DIAGNOSIS — H2513 Age-related nuclear cataract, bilateral: Secondary | ICD-10-CM | POA: Diagnosis not present

## 2020-06-08 DIAGNOSIS — H44113 Panuveitis, bilateral: Secondary | ICD-10-CM | POA: Diagnosis not present

## 2020-06-08 DIAGNOSIS — H35351 Cystoid macular degeneration, right eye: Secondary | ICD-10-CM | POA: Diagnosis not present

## 2020-06-08 DIAGNOSIS — H21541 Posterior synechiae (iris), right eye: Secondary | ICD-10-CM | POA: Diagnosis not present

## 2020-06-30 DIAGNOSIS — H44113 Panuveitis, bilateral: Secondary | ICD-10-CM | POA: Diagnosis not present

## 2020-06-30 DIAGNOSIS — H2513 Age-related nuclear cataract, bilateral: Secondary | ICD-10-CM | POA: Diagnosis not present

## 2020-08-03 DIAGNOSIS — H35351 Cystoid macular degeneration, right eye: Secondary | ICD-10-CM | POA: Diagnosis not present

## 2020-08-03 DIAGNOSIS — H2513 Age-related nuclear cataract, bilateral: Secondary | ICD-10-CM | POA: Diagnosis not present

## 2020-08-03 DIAGNOSIS — H21541 Posterior synechiae (iris), right eye: Secondary | ICD-10-CM | POA: Diagnosis not present

## 2020-08-03 DIAGNOSIS — H40032 Anatomical narrow angle, left eye: Secondary | ICD-10-CM | POA: Diagnosis not present

## 2020-08-03 DIAGNOSIS — H44113 Panuveitis, bilateral: Secondary | ICD-10-CM | POA: Diagnosis not present

## 2020-08-10 DIAGNOSIS — Z79899 Other long term (current) drug therapy: Secondary | ICD-10-CM | POA: Diagnosis not present

## 2020-08-10 DIAGNOSIS — H35351 Cystoid macular degeneration, right eye: Secondary | ICD-10-CM | POA: Diagnosis not present

## 2020-08-10 DIAGNOSIS — I10 Essential (primary) hypertension: Secondary | ICD-10-CM | POA: Diagnosis not present

## 2020-08-10 DIAGNOSIS — H4020X0 Unspecified primary angle-closure glaucoma, stage unspecified: Secondary | ICD-10-CM | POA: Diagnosis not present

## 2020-08-10 DIAGNOSIS — H2142 Pupillary membranes, left eye: Secondary | ICD-10-CM | POA: Diagnosis not present

## 2020-08-10 DIAGNOSIS — H21541 Posterior synechiae (iris), right eye: Secondary | ICD-10-CM | POA: Diagnosis not present

## 2020-08-10 DIAGNOSIS — H2513 Age-related nuclear cataract, bilateral: Secondary | ICD-10-CM | POA: Diagnosis not present

## 2020-08-10 DIAGNOSIS — H40032 Anatomical narrow angle, left eye: Secondary | ICD-10-CM | POA: Diagnosis not present

## 2020-08-10 DIAGNOSIS — H44113 Panuveitis, bilateral: Secondary | ICD-10-CM | POA: Diagnosis not present

## 2020-08-10 DIAGNOSIS — H35033 Hypertensive retinopathy, bilateral: Secondary | ICD-10-CM | POA: Diagnosis not present

## 2020-08-10 DIAGNOSIS — H209 Unspecified iridocyclitis: Secondary | ICD-10-CM | POA: Insufficient documentation

## 2020-08-23 DIAGNOSIS — H4042X3 Glaucoma secondary to eye inflammation, left eye, severe stage: Secondary | ICD-10-CM | POA: Diagnosis not present

## 2020-08-23 DIAGNOSIS — H4042X4 Glaucoma secondary to eye inflammation, left eye, indeterminate stage: Secondary | ICD-10-CM | POA: Diagnosis not present

## 2020-08-23 DIAGNOSIS — H2142 Pupillary membranes, left eye: Secondary | ICD-10-CM | POA: Diagnosis not present

## 2020-08-23 DIAGNOSIS — H209 Unspecified iridocyclitis: Secondary | ICD-10-CM | POA: Diagnosis not present

## 2020-08-24 DIAGNOSIS — Z48810 Encounter for surgical aftercare following surgery on the sense organs: Secondary | ICD-10-CM | POA: Diagnosis not present

## 2020-08-24 DIAGNOSIS — H44133 Sympathetic uveitis, bilateral: Secondary | ICD-10-CM | POA: Diagnosis not present

## 2020-08-24 DIAGNOSIS — H44113 Panuveitis, bilateral: Secondary | ICD-10-CM | POA: Diagnosis not present

## 2020-08-24 DIAGNOSIS — H2513 Age-related nuclear cataract, bilateral: Secondary | ICD-10-CM | POA: Diagnosis not present

## 2020-08-24 DIAGNOSIS — H2143 Pupillary membranes, bilateral: Secondary | ICD-10-CM | POA: Diagnosis not present

## 2020-08-24 DIAGNOSIS — H209 Unspecified iridocyclitis: Secondary | ICD-10-CM | POA: Diagnosis not present

## 2020-08-24 DIAGNOSIS — Z79899 Other long term (current) drug therapy: Secondary | ICD-10-CM | POA: Diagnosis not present

## 2020-08-24 DIAGNOSIS — H4043X4 Glaucoma secondary to eye inflammation, bilateral, indeterminate stage: Secondary | ICD-10-CM | POA: Diagnosis not present

## 2020-09-28 DIAGNOSIS — H44113 Panuveitis, bilateral: Secondary | ICD-10-CM | POA: Diagnosis not present

## 2020-09-28 DIAGNOSIS — H21541 Posterior synechiae (iris), right eye: Secondary | ICD-10-CM | POA: Diagnosis not present

## 2020-09-28 DIAGNOSIS — H35351 Cystoid macular degeneration, right eye: Secondary | ICD-10-CM | POA: Diagnosis not present

## 2020-09-28 DIAGNOSIS — H2513 Age-related nuclear cataract, bilateral: Secondary | ICD-10-CM | POA: Diagnosis not present

## 2020-10-01 DIAGNOSIS — H44113 Panuveitis, bilateral: Secondary | ICD-10-CM | POA: Diagnosis not present

## 2020-10-01 DIAGNOSIS — Z79899 Other long term (current) drug therapy: Secondary | ICD-10-CM | POA: Diagnosis not present

## 2020-11-01 ENCOUNTER — Other Ambulatory Visit: Payer: Self-pay

## 2020-11-01 NOTE — Progress Notes (Signed)
Chief Complaint  Patient presents with   Annual Exam   Medication Management   Hypertension     HPI: Patient  Monica Estrada  55 y.o. comes in today for [place] visit and follow-up. 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  She has been out of work since the fall we will be planning on going back in October.  He has had cataract surgery and what sounds like a stent put in her eye.  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  Family history of diabetes and apparent type II.  She was on Humira at one time but it was stopped because of question swelling.    Hypertension is on amlodipine 5 lisinopril HCTZ 20/25.  Blood pressures generally been well.  She occasionally gets wheezing when she has a lot of dust around was hospitalized with RSV severe bronchitis possible asthma in the winter of this year.  States that her eating is fine now but takes occasional albuterol.  She is not under respiratory care.  But was given inhalers with refills by the hospital team.  She is better. She sees GYN and is up-to-date apparently on an every 5-year Pap schedule.  Health Maintenance  Topic Date Due   Pneumococcal Vaccine 61-12 Years old (1 - PCV) Never done   Hepatitis C Screening  Never done   Zoster Vaccines- Shingrix (1 of 2) Never done   COLONOSCOPY (Pts 45-61yrs Insurance coverage will need to be confirmed)  12/07/2019   PAP SMEAR-Modifier  05/04/2020   COVID-19 Vaccine (4 - Booster for [place] series) 08/22/2020   INFLUENZA VACCINE  12/13/2020   TETANUS/TDAP  12/31/2020   MAMMOGRAM  03/25/2021   HIV Screening  Completed   HPV VACCINES  Aged Out   Health Maintenance Review LIFESTYLE:  Exercise: Not a  lot Tobacco/ETS:n Alcohol: n Sugar beverages: Sleep: About 8 hours Drug use: no HH of 2 Work: Out of work until the fall medical reasons  ROS:  To negative past GEN/ HEENT: No fever, significant weight changes sweats headaches CV/ PULM; No chest pain shortness of breath cough, syncope,edema  change in exercise tolerance. GI /GU: No adominal pain, vomiting, change in bowel habits. No blood in the stool. No significant GU symptoms. SKIN/HEME: ,no acute skin rashes suspicious lesions or bleeding. No lymphadenopathy, nodules, masses.  NEURO/ PSYCH:  No neurologic signs such as weakness numbness. No depression anxiety. IMM/ Allergy: No unusual infections.  Allergy .   REST of 12 system review negative except as per HPI   Past Medical History:  Diagnosis Date   Abnormal Pap smear    Colonic edema    mild left- ed eval abd ct   Gave birth to child recently    2012   History of hypothyroidism 1999   of post partum   Hx of abnormal cervical Pap smear    Hypertension    RSV (respiratory syncytial virus infection)    with wheezing and asthmatic sx   Uveitis    stable   Wheezing 04/01/2015    Past Surgical History:  Procedure Laterality Date   CHOLECYSTECTOMY     1999   TUBAL LIGATION  12/31/2010   Procedure: BILATERAL TUBAL LIGATION;  Surgeon: Blane Ohara Meisinger;  Location: [facility]  ORS;  Service: Gynecology;  Laterality: Bilateral;  Bilateral post partum tubal ligation    Family History  Problem Relation Age of Onset   Hypertension Mother    Diabetes Mother    Thyroid disease Mother    Diabetes Father    Hypertension Father    Lupus Sister    Rheum arthritis Sister    Other Sister        lamb disease   Diabetes Brother    Diabetes Son    Stroke Sister     Social History   Socioeconomic History   Marital status: Single    Spouse name: Not on file   Number of children: Not on file   Years of education: Not on file   Highest education level: Not on file  Occupational  History   Occupation: retail  Tobacco Use   Smoking status: Never   Smokeless tobacco: Never  Substance and Sexual Activity   Alcohol use: No   Drug use: No   Sexual activity: Not Currently  Other Topics Concern   Not on file  Social History Narrative   [name]  ABOUT 35 HOURS    Single   HH of 3 son  64 y  And baby girl 2 year  Father out of country comes back [place]. Some financial support .    Father  In [place].   No pets.    Neg ets .    2 jobs  hh of 2  34 yo and older teen son college                Social Determinants of Radio broadcast assistant Strain: Not on file  Food Insecurity: Not on file  Transportation Needs: Not on file  Physical Activity: Not on file  Stress: Not on file  Social Connections: Not on file    Outpatient Medications Prior to Visit  Medication Sig Dispense Refill   acetaZOLAMIDE (DIAMOX) 250 MG tablet Take 500 mg by mouth in the morning and at bedtime.     albuterol (VENTOLIN HFA) 108 (90 Base) MCG/ACT inhaler Inhale 2 puffs into the lungs every 4 (four) hours as needed for wheezing or shortness of breath. 18 g 1   amLODipine (NORVASC) 5 MG tablet Take 1 tablet (5 mg total) by mouth daily. 90 tablet 3   atropine 1 % ophthalmic solution Place 1 drop into the right eye 2 (two) times daily.     azaTHIOprine (IMURAN) 50 MG tablet Take 100 mg by mouth in the morning and at bedtime.     brimonidine (ALPHAGAN) 0.2 % ophthalmic solution Place 1 drop into both eyes every 8 (eight) hours.     dorzolamide-timolol (COSOPT) 22.3-6.8 MG/ML ophthalmic solution Place 1 drop into both eyes in the morning and at bedtime.     ferrous sulfate 325 (65 FE) MG tablet Take 650 mg by mouth daily with breakfast.     Fluticasone-Salmeterol (ADVAIR DISKUS) 250-50 MCG/DOSE AEPB Inhale 1 puff into the lungs in the morning and at bedtime. 180 each 3   ipratropium-albuterol (DUONEB) 0.5-2.5 (3) MG/3ML SOLN Take 3 mLs by nebulization every 4 (four) hours as needed (shortness of  breath/wheezing). 540 mL 0   latanoprost (XALATAN) 0.005 % ophthalmic solution Place 1 drop into both eyes at bedtime.     lisinopril-hydrochlorothiazide (ZESTORETIC) 20-25 MG tablet Take 1 tablet by mouth daily. 90 tablet 3   nystatin ointment (MYCOSTATIN) Apply 1 application topically in the morning and at bedtime.  prednisoLONE acetate (PRED FORTE) 1 % ophthalmic suspension Place 1 drop into the left eye every 2 (two) hours. When awake.     tiotropium (SPIRIVA HANDIHALER) 18 MCG inhalation capsule Place 1 capsule (18 mcg total) into inhaler and inhale daily. 90 capsule 3   triamcinolone ointment (KENALOG) 0.1 % Apply 1 application topically in the morning and at bedtime.     chlorpheniramine-HYDROcodone (TUSSIONEX) 10-8 MG/5ML SUER Take 5 mLs by mouth every 12 (twelve) hours as needed for cough. 140 mL 0   No facility-administered medications prior to visit.     EXAM:  BP 132/90 (BP Location: Left Arm, Patient Position: Sitting, Cuff Size: Normal)   Pulse 69   Temp 98.3 F (36.8 C) (Oral)   Ht 5' 3.5" (1.613 m)   Wt 200 lb 6.4 oz (90.9 kg)   SpO2 96%   BMI 34.94 kg/m   Body mass index is 34.94 kg/m. Wt Readings from Last 3 Encounters:  11/02/20 200 lb 6.4 oz (90.9 kg)  05/18/20 190 lb (86.2 kg)  05/17/20 190 lb (86.2 kg)    Physical Exam: Vital signs reviewed SJG:GEZM is a well-developed well-nourished alert cooperative    who appearsr stated age in no acute distress.  HEENT: normocephalic atraumatic , Eyes: PERRL eyes are slightly crusted +1 red nares: paten,t no deformity discharge or tenderness., Ears: no deformity EAC's clear TMs with normal landmarks. Mouth: Masked NECK: supple without masses, thyromegaly or bruits. CHEST/PULM:  Clear to auscultation and percussion breath sounds equal no wheeze , rales or rhonchi. No chest wall deformities or tenderness. Breast: normal by inspection . No dimpling, discharge, masses, tenderness or discharge . CV: PMI is nondisplaced,  S1 S2 no gallops, murmurs, rubs. Peripheral pulses are full without delay.No JVD .  ABDOMEN: Bowel sounds normal nontender  No guard or rebound, no hepato splenomegal no CVA tenderness.   Extremtities:  No clubbing cyanosis or edema, no acute joint swelling or redness no focal atrophy left ankle might be puffy laterally no bony tenderness NEURO:  Oriented x3, cranial nerves 3-12 appear to be intact, no obvious focal weakness,gait within normal limits no abnormal reflexes or asymmetrical SKIN: No acute rashes normal turgor, color, no bruising or petechiae. PSYCH: Oriented, good eye contact, no obvious depression anxiety, cognition and judgment appear normal. LN: no cervical axillary inguinal adenopathy  Lab Results  Component Value Date   WBC 9.7 05/21/2020   HGB 12.2 05/21/2020   HCT 37.7 05/21/2020   PLT 260 05/21/2020   GLUCOSE 209 (H) 05/24/2020   CHOL 199 09/24/2017   TRIG 116.0 09/24/2017   HDL 50.50 09/24/2017   LDLDIRECT 144.5 10/31/2010   LDLCALC 126 (H) 09/24/2017   ALT 13 09/24/2017   AST 14 09/24/2017   NA 135 05/24/2020   K 3.6 05/24/2020   CL 108 05/24/2020   CREATININE 0.74 05/24/2020   BUN 21 (H) 05/24/2020   CO2 19 (L) 05/24/2020   TSH 4.10 09/24/2017   HGBA1C 5.6 05/24/2020    BP Readings from Last 3 Encounters:  11/02/20 132/90  05/25/20 (!) 102/58  05/17/20 (!) 162/104    Lab plan eviewed with patient also reviewed care everywhere.  ASSESSMENT AND PLAN:  Discussed the following assessment and plan:    ICD-10-CM   1. Visit for preventive health examination  Z00.00 CBC with Differential/Platelet    Basic metabolic panel    Hemoglobin A1c    TSH    Lipid panel    T4, free  Vitamin B12    Vitamin B12    T4, free    Lipid panel    TSH    Hemoglobin R1V    Basic metabolic panel    CBC with Differential/Platelet    2. Essential hypertension  I10 CBC with Differential/Platelet    Basic metabolic panel    Hemoglobin A1c    TSH    Lipid panel     T4, free    Vitamin B12    Vitamin B12    T4, free    Lipid panel    TSH    Hemoglobin Q0G    Basic metabolic panel    CBC with Differential/Platelet    3. 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 Hyperglycemia  R73.9 CBC with Differential/Platelet    Basic metabolic panel    Hemoglobin A1c    TSH    Lipid panel    T4, free    Vitamin B12    Vitamin B12    T4, free    Lipid panel    TSH    Hemoglobin J0D    Basic metabolic panel    CBC with Differential/Platelet    6. Uveitis  H20.9 CBC with Differential/Platelet    Basic metabolic panel    Hemoglobin A1c    TSH    Lipid panel    T4, free    Vitamin B12    Vitamin B12    T4, free    Lipid panel    TSH    Hemoglobin T2I    Basic metabolic panel    CBC with Differential/Platelet    7. Leukopenia, unspecified type  D72.819 Vitamin B12    Vitamin B12    8. Colon cancer screening  Z12.11 Ambulatory referral to Gastroenterology    Blood sugar was over 200 when she was on prednisone under care record reviewed from [facility] eye.  She may be prediabetic versus diabetic check labs today No diagnosis of systemic rheumatologic disease but consider and follow as indicated. Of note she had l leukopenia on her last blood count but uncertain what meds she was on at the time. Will repeat today with a B12 level and check her glucose status. Make sure blood pressure at goal at  home Continue medications.  At this time   Return for depending on results6-12 months.  Patient Care Team: Nilza Eaker, Standley Brooking, MD as PCP - General Newton Pigg, MD (Obstetrics and Gynecology) Feliz Beam, MD as Referring Physician (Ophthalmology) Patient Instructions  Please make sure your blood pressure goal is 130/80 or below Continue current medicines. Labs today to assess your blood sugar  you could have prediabetes or early diabetes Definitely avoid sugar drinks and extra sweets and simple carbohydrates If needed we can add medication  I will review your chart eventually and see if there are other loose ends that need to be addressed.  Will refer for routine colonoscopy as you are due. We will plan follow-up depending on lab work and results if we have to follow your sugar I usually see people every 3 to 6 months.  Preventing Type 2 Diabetes Mellitus Type 2 diabetes, also called type 2 diabetes mellitus, is a long-term (chronic) disease that affects sugar (glucose) levels in your blood. Normally, a hormone called insulin allows glucose to enter cells in your body. The cells use glucose for energy. With type 2 diabetes, you will have one or both of these problems: Your pancreas does not make enough insulin. Cells in your body do not respond properly to insulin that your body makes (insulin resistance). Insulin resistance or lack of insulin causes extra glucose to build up in the blood instead of going into cells. As a result, high blood glucose (hyperglycemia) develops. That can cause many complications. Being overweight or obese and having an inactive (sedentary) lifestyle can increase your risk for diabetes. Type 2 diabetes can be delayedor prevented by making certain nutrition and lifestyle changes. How can this condition affect me? If you do not take steps to prevent diabetes, your blood glucose levels may keep increasing over time. Too much glucose in your blood for a long time  candamage your blood vessels, heart, kidneys, nerves, and eyes. Type 2 diabetes can lead to chronic health problems and complications, such as: Heart disease. Stroke. Blindness. Kidney disease. Depression. Poor circulation in your feet and legs. In severe cases, a foot or leg may need to be surgically removed (amputated). What can increase my risk? You may be more likely to develop type 2 diabetes if you: Have type 2 diabetes in your family. Are overweight or obese. Have a sedentary lifestyle. Have insulin resistance or a history of prediabetes. Have a history of pregnancy-related (gestational) diabetes or polycystic ovary syndrome (PCOS). What actions can I take to prevent this? It can be difficult to recognize signs of type 2 diabetes. Taking action to prevent the disease before you develop symptoms is the best way to avoid possible damage to your body. Making certain nutrition and lifestyle changesmay prevent or delay the disease and related health problems. Nutrition  Eat healthy meals and snacks regularly. Do not skip meals. Fruit or a handful of nuts is a healthy snack between meals. Drink water throughout the day. Avoid drinks that contain added sugar, such as soda or sweetened tea. Drink enough fluid to keep your urine pale yellow. Follow instructions from your health care provider about eating or drinking restrictions. Limit the amount of food you eat by: Controlling how much you eat at a time (portion size). Checking food labels for the serving sizes of food. Using a kitchen scale to weigh amounts of food. Saut or steam food instead of frying it. Cook with water or broth instead of oils or butter. Limit saturated fat and salt (sodium) in your diet. Have no more than 1 tsp (2,400 mg) of sodium a day. If you have heart disease or high blood pressure, use less than ? tsp (1,500 mg) of sodium a day.  Lifestyle  Lose weight if needed and as told. Your health care provider can  determine how much weight loss is best for you and  can help you lose weight safely. If you are overweight or obese, you may be told to lose at least 5?7% of your body weight. Manage blood pressure, cholesterol, and stress. Your health care provider will help determine the best treatment for you. Do not use any products that contain nicotine or tobacco, such as cigarettes, e-cigarettes, and chewing tobacco. If you need help quitting, ask your health care provider.  Activity  Do physical activity that makes your heart beat faster and makes you sweat (moderate intensity). Do this for at least 30 minutes on at least 5 days of the week, or as much as told by your health care provider. Ask your health care provider what activities are safe for you. A mix of activities may be best, such as walking, swimming, cycling, and strength training. Try to add physical activity into your day. For example: Park your car farther away than usual so that you walk more. Take a walk during your lunch break. Use stairs instead of elevators or escalators. Walk or bike to work instead of driving.  Alcohol use If you drink alcohol: Limit how much you use to: 0?1 drink a day for women who are not pregnant. 0?2 drinks a day for men. Be aware of how much alcohol is in your drink. In the [place] one drink equals one 12 oz bottle of beer (355 mL), one 5 oz glass of wine (148 mL), or one 1 oz glass of hard liquor (44 mL). General information Talk with your health care provider about your risk factors and how you can reduce your risk for diabetes. Have your blood glucose tested regularly, as told by your health care provider. Get screening tests as told by your health care provider. You may have these regularly, especially if you have certain risk factors for type 2 diabetes. Make an appointment with a registered dietitian. This diet and nutrition specialist can help you make a healthy eating plan and help you understand  portion sizes and food labels. Where to find support Ask your health care provider to recommend a registered dietitian, a certified diabetes care and education specialist, or a weight loss program. Look for local or online weight loss groups. Join a gym, fitness club, or outdoor activity group, such as a walking club. Where to find more information To learn more about diabetes and diabetes prevention, visit: American Diabetes Association (ADA): www.diabetes.[employer] of Diabetes and Digestive and Kidney Diseases: [employer].dk To learn more about healthy eating, visit: U.S. Department of Agriculture Scientist, research (physical sciences)): FormerBoss.no Office of Disease Prevention and Health Promotion (ODPHP): [place] Summary You can delay or prevent type 2 diabetes by eating healthy foods, losing weight if needed, and increasing your physical activity. Talk with your health care provider about your risk factors for type 2 diabetes and how you can reduce your risk. It can be difficult to recognize the signs of type 2 diabetes. The best way to avoid possible damage to your body is to take action to prevent the disease before you develop symptoms. Get screening tests as told by your health care provider. This information is not intended to replace advice given to you by your health care provider. Make sure you discuss any questions you have with your healthcare provider. Document Revised: 04/14/2020 Document Reviewed: 11/26/2019 Elsevier Patient Education  2022 [place] Laconda Basich M.D.

## 2020-11-02 ENCOUNTER — Encounter: Payer: Self-pay | Admitting: Internal Medicine

## 2020-11-02 ENCOUNTER — Ambulatory Visit (INDEPENDENT_AMBULATORY_CARE_PROVIDER_SITE_OTHER): Payer: BC Managed Care – PPO | Admitting: Internal Medicine

## 2020-11-02 VITALS — BP 132/90 | HR 69 | Temp 98.3°F | Ht 63.5 in | Wt 200.4 lb

## 2020-11-02 DIAGNOSIS — E063 Autoimmune thyroiditis: Secondary | ICD-10-CM

## 2020-11-02 DIAGNOSIS — Z1211 Encounter for screening for malignant neoplasm of colon: Secondary | ICD-10-CM | POA: Diagnosis not present

## 2020-11-02 DIAGNOSIS — I10 Essential (primary) hypertension: Secondary | ICD-10-CM

## 2020-11-02 DIAGNOSIS — R739 Hyperglycemia, unspecified: Secondary | ICD-10-CM | POA: Diagnosis not present

## 2020-11-02 DIAGNOSIS — Z79899 Other long term (current) drug therapy: Secondary | ICD-10-CM | POA: Diagnosis not present

## 2020-11-02 DIAGNOSIS — H209 Unspecified iridocyclitis: Secondary | ICD-10-CM

## 2020-11-02 DIAGNOSIS — Z0000 Encounter for general adult medical examination without abnormal findings: Secondary | ICD-10-CM | POA: Diagnosis not present

## 2020-11-02 DIAGNOSIS — D72819 Decreased white blood cell count, unspecified: Secondary | ICD-10-CM

## 2020-11-02 NOTE — Patient Instructions (Signed)
Please make sure your blood pressure goal is 130/80 or below Continue current medicines. Labs today to assess your blood sugar you could have prediabetes or early diabetes Definitely avoid sugar drinks and extra sweets and simple carbohydrates If needed we can add medication  I will review your chart eventually and see if there are other loose ends that need to be addressed.  Will refer for routine colonoscopy as you are due. We will plan follow-up depending on lab work and results if we have to follow your sugar I usually see people every 3 to 6 months.  Preventing Type 2 Diabetes Mellitus Type 2 diabetes, also called type 2 diabetes mellitus, is a long-term (chronic) disease that affects sugar (glucose) levels in your blood. Normally, a hormone called insulin allows glucose to enter cells in your body. The cells use glucose for energy. With type 2 diabetes, you will have one or both of these problems: Your pancreas does not make enough insulin. Cells in your body do not respond properly to insulin that your body makes (insulin resistance). Insulin resistance or lack of insulin causes extra glucose to build up in the blood instead of going into cells. As a result, high blood glucose (hyperglycemia) develops. That can cause many complications. Being overweight or obese and having an inactive (sedentary) lifestyle can increase your risk for diabetes. Type 2 diabetes can be delayedor prevented by making certain nutrition and lifestyle changes. How can this condition affect me? If you do not take steps to prevent diabetes, your blood glucose levels may keep increasing over time. Too much glucose in your blood for a long time candamage your blood vessels, heart, kidneys, nerves, and eyes. Type 2 diabetes can lead to chronic health problems and complications, such as: Heart disease. Stroke. Blindness. Kidney disease. Depression. Poor circulation in your feet and legs. In severe cases, a foot or  leg may need to be surgically removed (amputated). What can increase my risk? You may be more likely to develop type 2 diabetes if you: Have type 2 diabetes in your family. Are overweight or obese. Have a sedentary lifestyle. Have insulin resistance or a history of prediabetes. Have a history of pregnancy-related (gestational) diabetes or polycystic ovary syndrome (PCOS). What actions can I take to prevent this? It can be difficult to recognize signs of type 2 diabetes. Taking action to prevent the disease before you develop symptoms is the best way to avoid possible damage to your body. Making certain nutrition and lifestyle changesmay prevent or delay the disease and related health problems. Nutrition  Eat healthy meals and snacks regularly. Do not skip meals. Fruit or a handful of nuts is a healthy snack between meals. Drink water throughout the day. Avoid drinks that contain added sugar, such as soda or sweetened tea. Drink enough fluid to keep your urine pale yellow. Follow instructions from your health care provider about eating or drinking restrictions. Limit the amount of food you eat by: Controlling how much you eat at a time (portion size). Checking food labels for the serving sizes of food. Using a kitchen scale to weigh amounts of food. Saut or steam food instead of frying it. Cook with water or broth instead of oils or butter. Limit saturated fat and salt (sodium) in your diet. Have no more than 1 tsp (2,400 mg) of sodium a day. If you have heart disease or high blood pressure, use less than ? tsp (1,500 mg) of sodium a day.  Lifestyle  Lose weight  if needed and as told. Your health care provider can determine how much weight loss is best for you and can help you lose weight safely. If you are overweight or obese, you may be told to lose at least 5?7% of your body weight. Manage blood pressure, cholesterol, and stress. Your health care provider will help determine the best  treatment for you. Do not use any products that contain nicotine or tobacco, such as cigarettes, e-cigarettes, and chewing tobacco. If you need help quitting, ask your health care provider.  Activity  Do physical activity that makes your heart beat faster and makes you sweat (moderate intensity). Do this for at least 30 minutes on at least 5 days of the week, or as much as told by your health care provider. Ask your health care provider what activities are safe for you. A mix of activities may be best, such as walking, swimming, cycling, and strength training. Try to add physical activity into your day. For example: Park your car farther away than usual so that you walk more. Take a walk during your lunch break. Use stairs instead of elevators or escalators. Walk or bike to work instead of driving.  Alcohol use If you drink alcohol: Limit how much you use to: 0?1 drink a day for women who are not pregnant. 0?2 drinks a day for men. Be aware of how much alcohol is in your drink. In the [place] one drink equals one 12 oz bottle of beer (355 mL), one 5 oz glass of wine (148 mL), or one 1 oz glass of hard liquor (44 mL). General information Talk with your health care provider about your risk factors and how you can reduce your risk for diabetes. Have your blood glucose tested regularly, as told by your health care provider. Get screening tests as told by your health care provider. You may have these regularly, especially if you have certain risk factors for type 2 diabetes. Make an appointment with a registered dietitian. This diet and nutrition specialist can help you make a healthy eating plan and help you understand portion sizes and food labels. Where to find support Ask your health care provider to recommend a registered dietitian, a certified diabetes care and education specialist, or a weight loss program. Look for local or online weight loss groups. Join a gym, fitness club, or outdoor  activity group, such as a walking club. Where to find more information To learn more about diabetes and diabetes prevention, visit: American Diabetes Association (ADA): www.diabetes.[employer] of Diabetes and Digestive and Kidney Diseases: [employer] To learn more about healthy eating, visit: U.S. Department of Agriculture Scientist, research (physical sciences)): [employer].no Office of Disease Prevention and Health Promotion (ODPHP): [place] Summary You can delay or prevent type 2 diabetes by eating healthy foods, losing weight if needed, and increasing your physical activity. Talk with your health care provider about your risk factors for type 2 diabetes and how you can reduce your risk. It can be difficult to recognize the signs of type 2 diabetes. The best way to avoid possible damage to your body is to take action to prevent the disease before you develop symptoms. Get screening tests as told by your health care provider. This information is not intended to replace advice given to you by your health care provider. Make sure you discuss any questions you have with your healthcare provider. Document Revised: 04/14/2020 Document Reviewed: 11/26/2019 Elsevier Patient Education  [place].

## 2020-11-03 LAB — CBC WITH DIFFERENTIAL/PLATELET
Basophils Absolute: 0 10*3/uL (ref 0.0–0.1)
Basophils Relative: 0.8 % (ref 0.0–3.0)
Eosinophils Absolute: 0.3 10*3/uL (ref 0.0–0.7)
Eosinophils Relative: 7.6 % — ABNORMAL HIGH (ref 0.0–5.0)
HCT: 41 % (ref 36.0–46.0)
Hemoglobin: 13.9 g/dL (ref 12.0–15.0)
Lymphocytes Relative: 53.4 % — ABNORMAL HIGH (ref 12.0–46.0)
Lymphs Abs: 2 10*3/uL (ref 0.7–4.0)
MCHC: 33.9 g/dL (ref 30.0–36.0)
MCV: 89.8 fl (ref 78.0–100.0)
Monocytes Absolute: 0.3 10*3/uL (ref 0.1–1.0)
Monocytes Relative: 7 % (ref 3.0–12.0)
Neutro Abs: 1.2 10*3/uL — ABNORMAL LOW (ref 1.4–7.7)
Neutrophils Relative %: 31.2 % — ABNORMAL LOW (ref 43.0–77.0)
Platelets: 170 10*3/uL (ref 150.0–400.0)
RBC: 4.57 Mil/uL (ref 3.87–5.11)
RDW: 13.8 % (ref 11.5–15.5)
WBC: 3.8 10*3/uL — ABNORMAL LOW (ref 4.0–10.5)

## 2020-11-03 LAB — BASIC METABOLIC PANEL
BUN: 15 mg/dL (ref 6–23)
CO2: 30 mEq/L (ref 19–32)
Calcium: 10.2 mg/dL (ref 8.4–10.5)
Chloride: 102 mEq/L (ref 96–112)
Creatinine, Ser: 0.76 mg/dL (ref 0.40–1.20)
GFR: 87.96 mL/min (ref >60.00)
Glucose, Bld: 84 mg/dL (ref 70–99)
Potassium: 3.3 mEq/L — ABNORMAL LOW (ref 3.5–5.1)
Sodium: 139 mEq/L (ref 135–145)

## 2020-11-03 LAB — T4, FREE: Free T4: 0.7 ng/dL (ref 0.60–1.60)

## 2020-11-03 LAB — TSH: TSH: 6.67 u[IU]/mL — ABNORMAL HIGH (ref 0.35–4.50)

## 2020-11-03 LAB — LIPID PANEL
Cholesterol: 220 mg/dL — ABNORMAL HIGH (ref 0–200)
HDL: 46.5 mg/dL (ref >39.00)
LDL Cholesterol: 152 mg/dL — ABNORMAL HIGH (ref 0–99)
NonHDL: 173.59
Total CHOL/HDL Ratio: 5
Triglycerides: 106 mg/dL (ref 0.0–149.0)
VLDL: 21.2 mg/dL (ref 0.0–40.0)

## 2020-11-03 LAB — VITAMIN B12: Vitamin B-12: 1026 pg/mL — ABNORMAL HIGH (ref 211–911)

## 2020-11-03 LAB — HEMOGLOBIN A1C: Hgb A1c MFr Bld: 7.4 % — ABNORMAL HIGH (ref 4.6–6.5)

## 2020-11-07 NOTE — Progress Notes (Signed)
Thyroid slightly off  , HG a1c  is  ib diabetic range ,  b12 level very high.  Potassium sightly low.     Make appt  ( virtual or in person ok)  to discuss follow up management , may need to add medications,

## 2020-11-16 ENCOUNTER — Other Ambulatory Visit: Payer: Self-pay

## 2020-11-17 ENCOUNTER — Encounter: Payer: Self-pay | Admitting: Internal Medicine

## 2020-11-17 ENCOUNTER — Ambulatory Visit (INDEPENDENT_AMBULATORY_CARE_PROVIDER_SITE_OTHER): Payer: BC Managed Care – PPO | Admitting: Internal Medicine

## 2020-11-17 VITALS — BP 146/86 | HR 62 | Temp 97.5°F | Ht 63.5 in | Wt 200.6 lb

## 2020-11-17 DIAGNOSIS — E119 Type 2 diabetes mellitus without complications: Secondary | ICD-10-CM | POA: Diagnosis not present

## 2020-11-17 DIAGNOSIS — E876 Hypokalemia: Secondary | ICD-10-CM

## 2020-11-17 DIAGNOSIS — I10 Essential (primary) hypertension: Secondary | ICD-10-CM

## 2020-11-17 DIAGNOSIS — Z79899 Other long term (current) drug therapy: Secondary | ICD-10-CM

## 2020-11-17 DIAGNOSIS — J45909 Unspecified asthma, uncomplicated: Secondary | ICD-10-CM

## 2020-11-17 DIAGNOSIS — E063 Autoimmune thyroiditis: Secondary | ICD-10-CM

## 2020-11-17 LAB — POCT GLUCOSE (DEVICE FOR HOME USE): POC Glucose: 123 mg/dl — AB (ref 70–99)

## 2020-11-17 MED ORDER — METFORMIN HCL ER 500 MG PO TB24: 500.0000 mg | ORAL_TABLET | Freq: Every day | ORAL | 5 refills | Status: DC

## 2020-11-17 MED ORDER — PREDNISONE 20 MG PO TABS: 20.0000 mg | ORAL_TABLET | Freq: Two times a day (BID) | ORAL | 0 refills | Status: DC

## 2020-11-17 NOTE — Progress Notes (Signed)
Chief Complaint  Patient presents with   Results    HPI: Monica Estrada 55 y.o. come in for Fu abnormal labs after CPX  BG  elevated  a1c   fam hx of dm   has taken action since seeing results and  Fried food and  dec protion sized   no sugar drinks working on activity and tracking . No numbness prefers no meds  Thyroid:  ongoing prefers no meds if not really needed. Low potassium no sx   hx of same    no cramps or sx  Bp around 140 range taking meds  Wheezing s/p rsv and bronchitis  taking advair as needed and the albuterol AC not working to day to get fixed  asks about getting  steroid corse in case  needed   ok today  but some wheeze .    ROS: See pertinent positives and negatives per HPI.  Past Medical History:  Diagnosis Date   Abnormal Pap smear    Colonic edema    mild left- ed eval abd ct   Gave birth to child recently    2012   History of hypothyroidism 1999   of post partum   Hx of abnormal cervical Pap smear    Hypertension    RSV (respiratory syncytial virus infection)    with wheezing and asthmatic sx   Uveitis    stable   Wheezing 04/01/2015    Family History  Problem Relation Age of Onset   Hypertension Mother    Diabetes Mother    Thyroid disease Mother    Diabetes Father    Hypertension Father    Lupus Sister    Rheum arthritis Sister    Other Sister        lamb disease   Diabetes Brother    Diabetes Son    Stroke Sister     Social History   Socioeconomic History   Marital status: Single    Spouse name: Not on file   Number of children: Not on file   Years of education: Not on file   Highest education level: Not on file  Occupational History   Occupation: retail  Tobacco Use   Smoking status: Never   Smokeless tobacco: Never  Substance and Sexual Activity   Alcohol use: No   Drug use: No   Sexual activity: Not Currently  Other Topics Concern   Not on file  Social History Narrative   [name]  ABOUT 63 HOURS    Single   HH of  3 son  48 y  And baby girl 2 year  Father out of country comes back [place]. Some financial support .    Father  In [place].   No pets.    Neg ets .    2 jobs  hh of 2  58 yo and older teen son college                Social Determinants of Radio broadcast assistant Strain: Not on file  Food Insecurity: Not on file  Transportation Needs: Not on file  Physical Activity: Not on file  Stress: Not on file  Social Connections: Not on file    Outpatient Medications Prior to Visit  Medication Sig Dispense Refill   acetaZOLAMIDE (DIAMOX) 250 MG tablet Take 500 mg by mouth in the morning and at bedtime.     albuterol (VENTOLIN HFA) 108 (90 Base) MCG/ACT inhaler Inhale 2 puffs into the lungs  every 4 (four) hours as needed for wheezing or shortness of breath. 18 g 1   amLODipine (NORVASC) 5 MG tablet Take 1 tablet (5 mg total) by mouth daily. 90 tablet 3   atropine 1 % ophthalmic solution Place 1 drop into the right eye 2 (two) times daily.     azaTHIOprine (IMURAN) 50 MG tablet Take 100 mg by mouth in the morning and at bedtime.     brimonidine (ALPHAGAN) 0.2 % ophthalmic solution Place 1 drop into both eyes every 8 (eight) hours.     dorzolamide-timolol (COSOPT) 22.3-6.8 MG/ML ophthalmic solution Place 1 drop into both eyes in the morning and at bedtime.     ferrous sulfate 325 (65 FE) MG tablet Take 650 mg by mouth daily with breakfast.     Fluticasone-Salmeterol (ADVAIR DISKUS) 250-50 MCG/DOSE AEPB Inhale 1 puff into the lungs in the morning and at bedtime. 180 each 3   ipratropium-albuterol (DUONEB) 0.5-2.5 (3) MG/3ML SOLN Take 3 mLs by nebulization every 4 (four) hours as needed (shortness of breath/wheezing). 540 mL 0   latanoprost (XALATAN) 0.005 % ophthalmic solution Place 1 drop into both eyes at bedtime.     lisinopril-hydrochlorothiazide (ZESTORETIC) 20-25 MG tablet Take 1 tablet by mouth daily. 90 tablet 3   nystatin ointment (MYCOSTATIN) Apply 1 application topically in the morning and  at bedtime.     prednisoLONE acetate (PRED FORTE) 1 % ophthalmic suspension Place 1 drop into the left eye every 2 (two) hours. When awake.     tiotropium (SPIRIVA HANDIHALER) 18 MCG inhalation capsule Place 1 capsule (18 mcg total) into inhaler and inhale daily. 90 capsule 3   triamcinolone ointment (KENALOG) 0.1 % Apply 1 application topically in the morning and at bedtime.     No facility-administered medications prior to visit.     EXAM:  BP (!) 146/86 (BP Location: Left Arm, Patient Position: Sitting, Cuff Size: Normal)   Pulse 62   Temp (!) 97.5 F (36.4 C) (Temporal)   Ht 5' 3.5" (1.613 m)   Wt 200 lb 9.6 oz (91 kg)   SpO2 93%   BMI 34.98 kg/m   Body mass index is 34.98 kg/m.  GENERAL: vitals reviewed and listed above, alert, oriented, appears well hydrated and in no acute distress HEENT: atraumatic, conjunctiva  clear, no obvious abnormalities on inspection of external nose and ears OP :masked  NECK: no obvious masses on inspection palpation  LUNGS: bilateral wheeze  good air flow  CV: HRRR, no clubbing cyanosis or  peripheral edema nl cap refill  MS: moves all extremities without noticeable focal  abnormality PSYCH: pleasant and cooperative, no obvious depression or anxiety Lab Results  Component Value Date   WBC 3.8 (L) 11/02/2020   HGB 13.9 11/02/2020   HCT 41.0 11/02/2020   PLT 170.0 11/02/2020   GLUCOSE 84 11/02/2020   CHOL 220 (H) 11/02/2020   TRIG 106.0 11/02/2020   HDL 46.50 11/02/2020   LDLDIRECT 144.5 10/31/2010   LDLCALC 152 (H) 11/02/2020   ALT 13 09/24/2017   AST 14 09/24/2017   NA 139 11/02/2020   K 3.3 (L) 11/02/2020   CL 102 11/02/2020   CREATININE 0.76 11/02/2020   BUN 15 11/02/2020   CO2 30 11/02/2020   TSH 6.67 (H) 11/02/2020   HGBA1C 7.4 (H) 11/02/2020   BP Readings from Last 3 Encounters:  11/17/20 (!) 146/86  11/02/20 132/90  05/25/20 (!) 102/58    ASSESSMENT AND PLAN:  Discussed the following assessment and  plan:  New  onset type 2 diabetes mellitus ([place]) - Plan: POCT Glucose (Device for Home Use)  Medication management  Essential hypertension  Hypokalemia  AUTOIMMUNE THYROIDITIS  Asthmatic bronchitis without complication, unspecified asthma severity, unspecified whether persistent Lab review  see below for plan  Patient reluctant to add more medications at this time prescription written for metformin 500 XR prefer she start this while she works on her lifestyle but she may decide to do intensive lifestyle first.  Either way Will come back in 3 to 4 months with fasting lab and follow-up We can continue to hold off for TSH autoimmune thyroiditis if she wishes She will add high potassium foods and decrease the sodium in her diet. Blood pressure control is important to control we will follow-up. She can let us know which is the preferred glucometer and we can send in strips and supplies. She requests a course of prednisone in case needed for her wheezing today should take her Advair every day and can add to prednisone 5 days if needed follow-up with pulmonary or [place] this will increase her blood pressure sugar if she chooses to take it. -Patient advised to return or notify health care team  if  new concerns arise.  Patient Instructions  Take your Advair every day as a controller medicine and albuterol as needed  We can wait on adding medicine such as metformin for blood sugar control but you can add it at any time in addition to your intensive lifestyle intervention. I agree that tracking what you take in am blood sugars will be helpful. Ask your insurance company which machine and strips they will cover and let us know we can prescribe.  Instead of adding potassium pill increase the potassium in your diet and lower the sodium.  Thyroid is off again but acceptable close follow-up may have to add medication.  Blood sugar goal is fasting below 120 and 150-160 and below after eating.  We usually add a  statin medication in people with diabetes even if controlled to reduce the risk of heart attack and stroke. It is important to have your blood pressure controlled 130/80 range.  Plan fasting lab work in 3 to 4 months and then follow-up visit.   Standley Brooking. Valkyrie Guardiola M.D.

## 2020-11-17 NOTE — Patient Instructions (Signed)
Take your Advair every day as a controller medicine and albuterol as needed  We can wait on adding medicine such as metformin for blood sugar control but you can add it at any time in addition to your intensive lifestyle intervention. I agree that tracking what you take in am blood sugars will be helpful. Ask your insurance company which machine and strips they will cover and let us know we can prescribe.  Instead of adding potassium pill increase the potassium in your diet and lower the sodium.  Thyroid is off again but acceptable close follow-up may have to add medication.  Blood sugar goal is fasting below 120 and 150-160 and below after eating.  We usually add a statin medication in people with diabetes even if controlled to reduce the risk of heart attack and stroke. It is important to have your blood pressure controlled 130/80 range.  Plan fasting lab work in 3 to 4 months and then follow-up visit.

## 2020-12-15 DIAGNOSIS — H21541 Posterior synechiae (iris), right eye: Secondary | ICD-10-CM | POA: Diagnosis not present

## 2020-12-15 DIAGNOSIS — H44113 Panuveitis, bilateral: Secondary | ICD-10-CM | POA: Diagnosis not present

## 2020-12-15 DIAGNOSIS — H2513 Age-related nuclear cataract, bilateral: Secondary | ICD-10-CM | POA: Diagnosis not present

## 2020-12-15 DIAGNOSIS — H35351 Cystoid macular degeneration, right eye: Secondary | ICD-10-CM | POA: Diagnosis not present

## 2020-12-30 IMAGING — MG DIGITAL SCREENING BILAT W/ CAD
4 series · 4 of 4 positions shown · non-contrast
Comparison: Previous exam(s).

CLINICAL DATA: Screening.

EXAM:
DIGITAL SCREENING BILATERAL MAMMOGRAM WITH CAD

[L MLO]
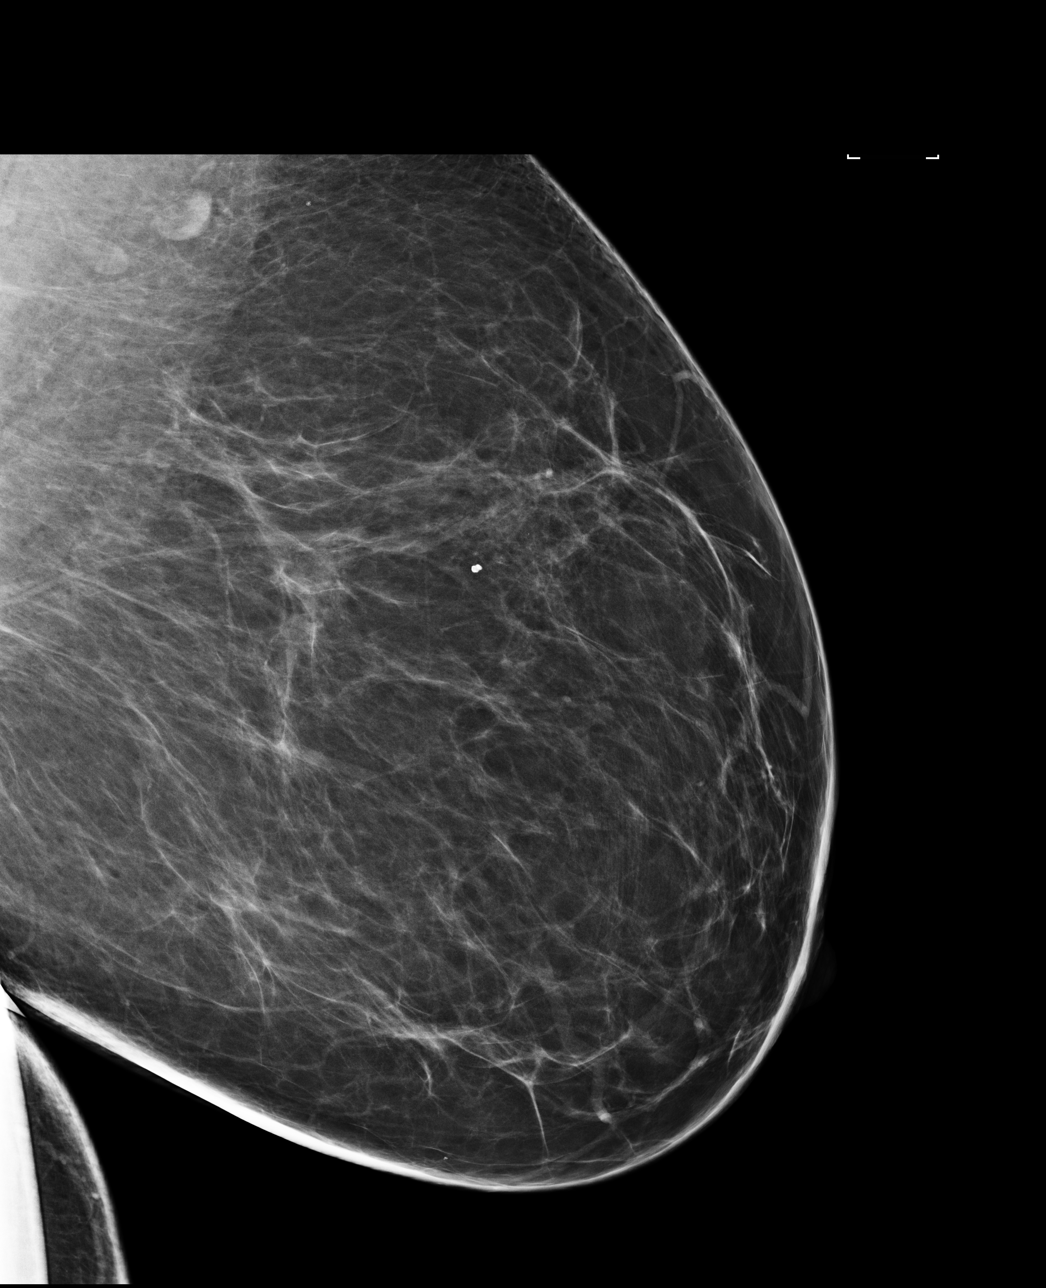

[R CC]
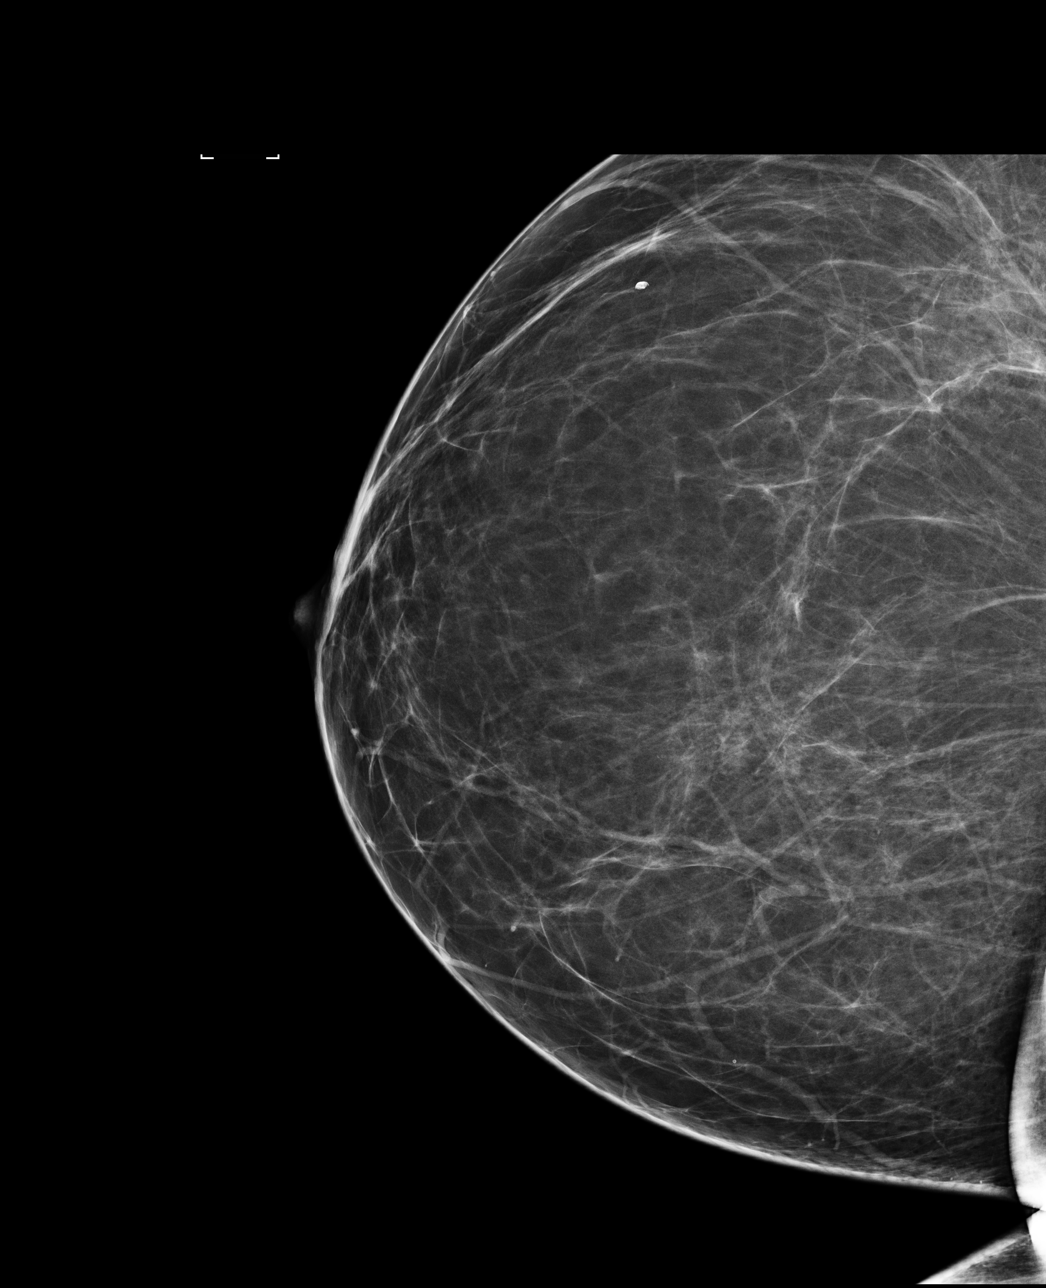

[R MLO]
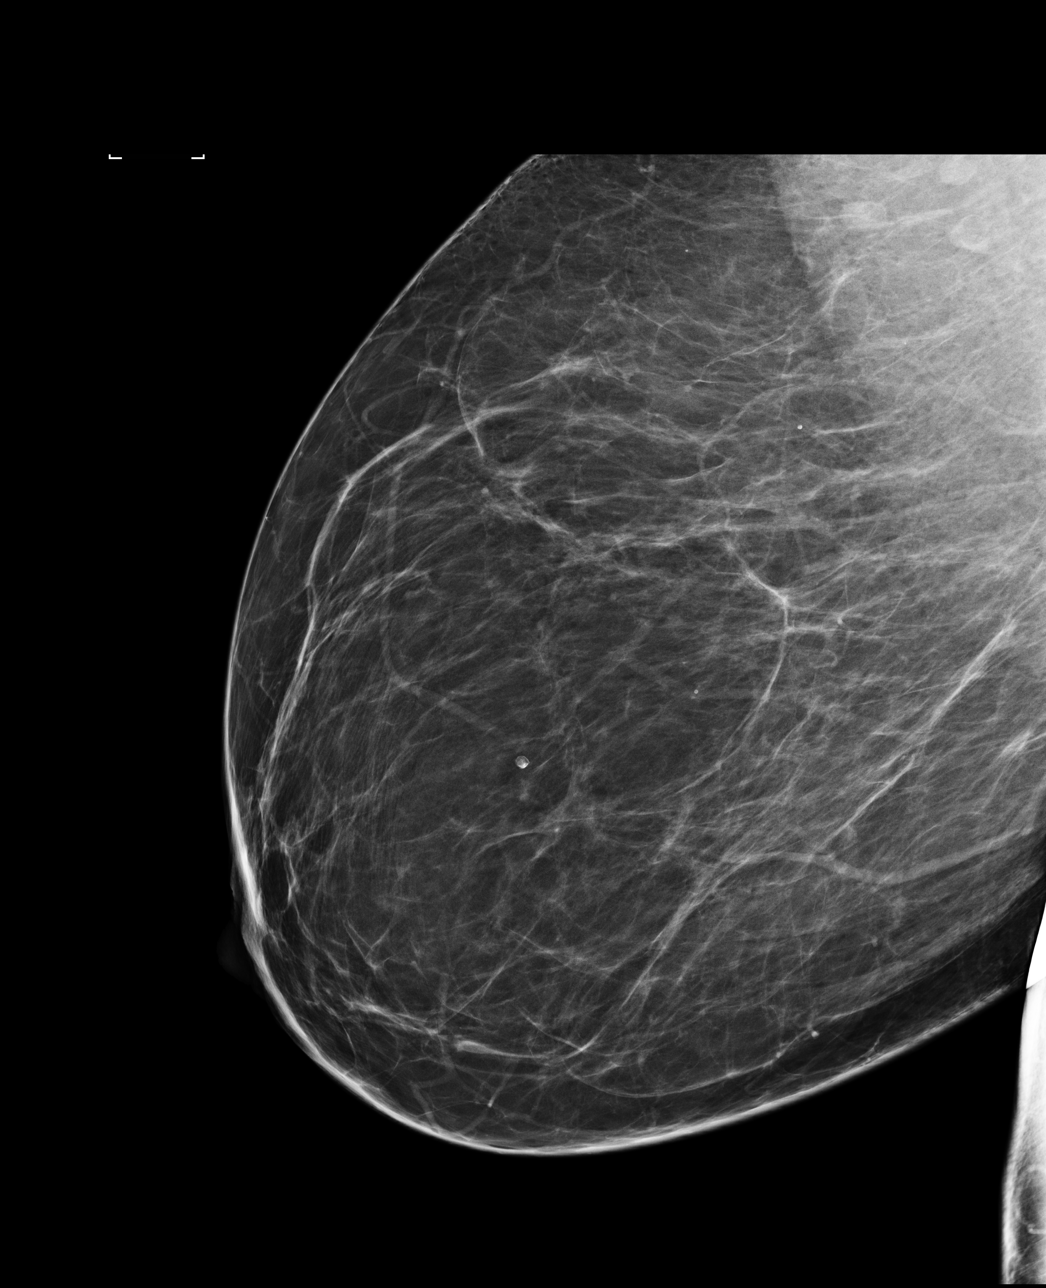

[L CC]
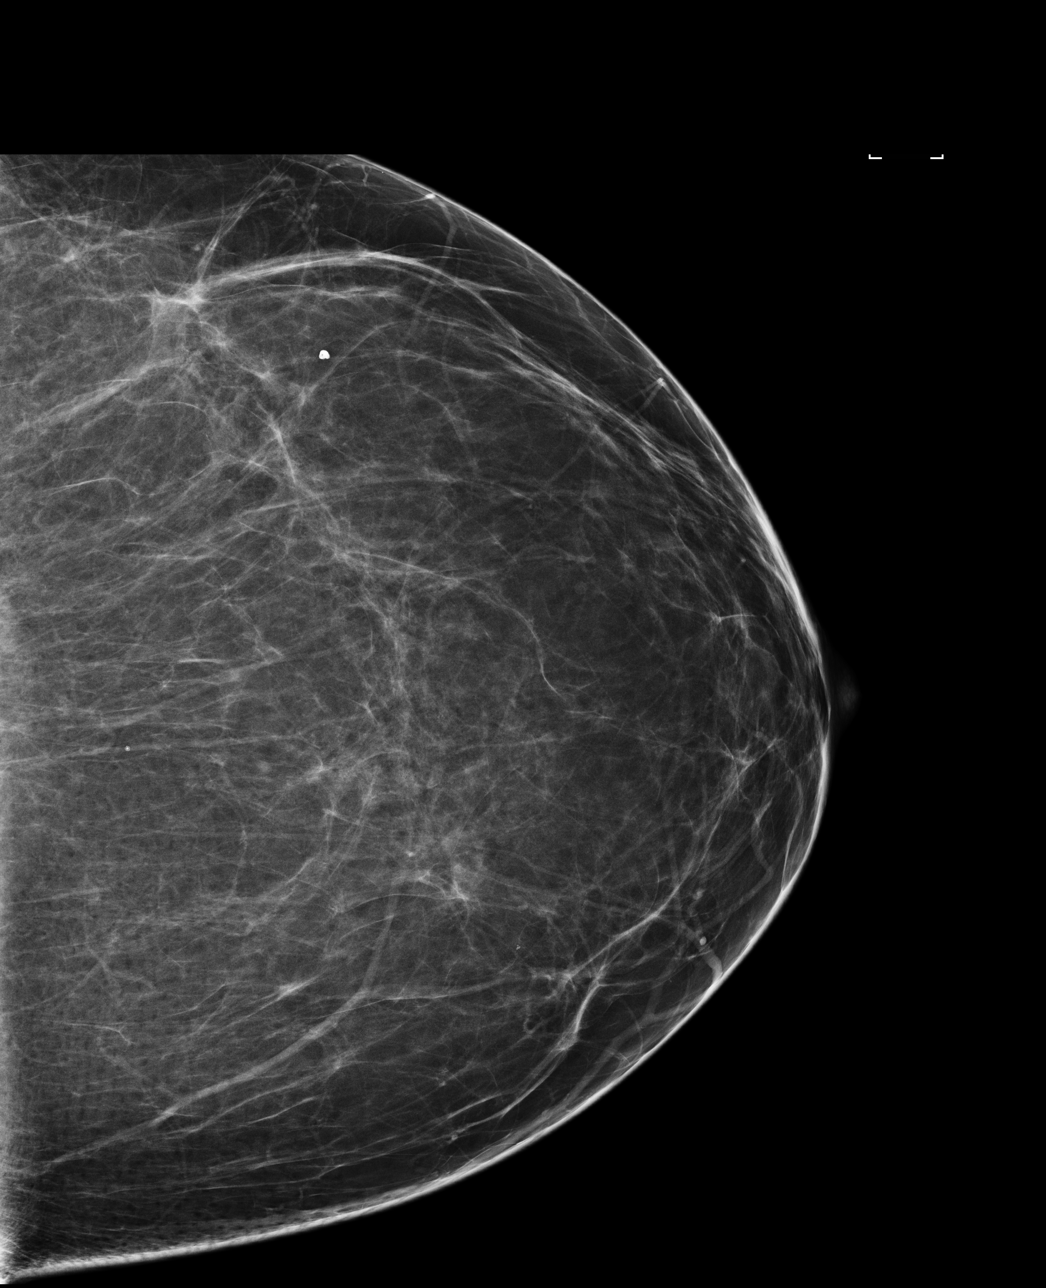

[4 of 4 positions shown; findings below may reference images not displayed]

ACR Breast Density Category b: There are scattered areas of
fibroglandular density.
FINDINGS: There are no findings suspicious for malignancy. Images were
processed with CAD.
IMPRESSION: No mammographic evidence of malignancy. A result letter of this
screening mammogram will be mailed directly to the patient.

RECOMMENDATION:
Screening mammogram in one year. (Code:AS-G-LCT)

BI-RADS CATEGORY  1: Negative.

## 2021-01-18 DIAGNOSIS — H44113 Panuveitis, bilateral: Secondary | ICD-10-CM | POA: Diagnosis not present

## 2021-01-18 DIAGNOSIS — H3581 Retinal edema: Secondary | ICD-10-CM | POA: Diagnosis not present

## 2021-01-18 DIAGNOSIS — H2513 Age-related nuclear cataract, bilateral: Secondary | ICD-10-CM | POA: Diagnosis not present

## 2021-01-18 DIAGNOSIS — H21541 Posterior synechiae (iris), right eye: Secondary | ICD-10-CM | POA: Diagnosis not present

## 2021-01-18 DIAGNOSIS — H30033 Focal chorioretinal inflammation, peripheral, bilateral: Secondary | ICD-10-CM | POA: Diagnosis not present

## 2021-01-18 DIAGNOSIS — H35351 Cystoid macular degeneration, right eye: Secondary | ICD-10-CM | POA: Diagnosis not present

## 2021-01-28 ENCOUNTER — Other Ambulatory Visit: Payer: Self-pay | Admitting: Internal Medicine

## 2021-02-16 DIAGNOSIS — H4020X0 Unspecified primary angle-closure glaucoma, stage unspecified: Secondary | ICD-10-CM | POA: Diagnosis not present

## 2021-02-16 DIAGNOSIS — H44113 Panuveitis, bilateral: Secondary | ICD-10-CM | POA: Diagnosis not present

## 2021-02-16 DIAGNOSIS — H2513 Age-related nuclear cataract, bilateral: Secondary | ICD-10-CM | POA: Diagnosis not present

## 2021-02-17 ENCOUNTER — Encounter: Payer: Self-pay | Admitting: Gastroenterology

## 2021-03-08 ENCOUNTER — Other Ambulatory Visit: Payer: Self-pay

## 2021-03-08 ENCOUNTER — Other Ambulatory Visit (INDEPENDENT_AMBULATORY_CARE_PROVIDER_SITE_OTHER): Payer: BC Managed Care – PPO

## 2021-03-08 DIAGNOSIS — I10 Essential (primary) hypertension: Secondary | ICD-10-CM

## 2021-03-08 DIAGNOSIS — E063 Autoimmune thyroiditis: Secondary | ICD-10-CM | POA: Diagnosis not present

## 2021-03-08 DIAGNOSIS — E876 Hypokalemia: Secondary | ICD-10-CM

## 2021-03-08 DIAGNOSIS — Z79899 Other long term (current) drug therapy: Secondary | ICD-10-CM

## 2021-03-08 DIAGNOSIS — E119 Type 2 diabetes mellitus without complications: Secondary | ICD-10-CM

## 2021-03-08 LAB — LIPID PANEL
Cholesterol: 205 mg/dL — ABNORMAL HIGH (ref 0–200)
HDL: 43.6 mg/dL (ref >39.00)
LDL Cholesterol: 146 mg/dL — ABNORMAL HIGH (ref 0–99)
NonHDL: 161.23
Total CHOL/HDL Ratio: 5
Triglycerides: 74 mg/dL (ref 0.0–149.0)
VLDL: 14.8 mg/dL (ref 0.0–40.0)

## 2021-03-08 LAB — BASIC METABOLIC PANEL
BUN: 13 mg/dL (ref 6–23)
CO2: 30 mEq/L (ref 19–32)
Calcium: 10.5 mg/dL (ref 8.4–10.5)
Chloride: 100 mEq/L (ref 96–112)
Creatinine, Ser: 0.74 mg/dL (ref 0.40–1.20)
GFR: 90.6 mL/min (ref >60.00)
Glucose, Bld: 111 mg/dL — ABNORMAL HIGH (ref 70–99)
Potassium: 3.5 mEq/L (ref 3.5–5.1)
Sodium: 141 mEq/L (ref 135–145)

## 2021-03-08 LAB — HEMOGLOBIN A1C: Hgb A1c MFr Bld: 6 % (ref 4.6–6.5)

## 2021-03-08 LAB — TSH: TSH: 4.7 u[IU]/mL (ref 0.35–5.50)

## 2021-03-08 LAB — MAGNESIUM: Magnesium: 1.8 mg/dL (ref 1.5–2.5)

## 2021-03-08 LAB — T4, FREE: Free T4: 0.67 ng/dL (ref 0.60–1.60)
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
## 2021-03-15 ENCOUNTER — Other Ambulatory Visit: Payer: Self-pay

## 2021-03-15 ENCOUNTER — Encounter: Payer: Self-pay | Admitting: Internal Medicine

## 2021-03-15 ENCOUNTER — Ambulatory Visit (INDEPENDENT_AMBULATORY_CARE_PROVIDER_SITE_OTHER): Payer: BC Managed Care – PPO | Admitting: Internal Medicine

## 2021-03-15 VITALS — BP 160/98 | HR 84 | Temp 97.9°F | Ht 63.5 in | Wt 186.4 lb

## 2021-03-15 DIAGNOSIS — Z23 Encounter for immunization: Secondary | ICD-10-CM | POA: Diagnosis not present

## 2021-03-15 DIAGNOSIS — E063 Autoimmune thyroiditis: Secondary | ICD-10-CM

## 2021-03-15 DIAGNOSIS — F439 Reaction to severe stress, unspecified: Secondary | ICD-10-CM

## 2021-03-15 DIAGNOSIS — E1169 Type 2 diabetes mellitus with other specified complication: Secondary | ICD-10-CM | POA: Diagnosis not present

## 2021-03-15 DIAGNOSIS — I10 Essential (primary) hypertension: Secondary | ICD-10-CM | POA: Diagnosis not present

## 2021-03-15 DIAGNOSIS — E785 Hyperlipidemia, unspecified: Secondary | ICD-10-CM

## 2021-03-15 DIAGNOSIS — Z79899 Other long term (current) drug therapy: Secondary | ICD-10-CM

## 2021-03-15 MED ORDER — ROSUVASTATIN CALCIUM 10 MG PO TABS
10.0000 mg | ORAL_TABLET | Freq: Every day | ORAL | 1 refills | Status: DC
Start: 2021-03-15 — End: 2021-07-20

## 2021-03-15 MED ORDER — AMLODIPINE BESYLATE 10 MG PO TABS: 10.0000 mg | ORAL_TABLET | Freq: Every day | ORAL | 1 refills | Status: DC

## 2021-03-15 NOTE — Progress Notes (Signed)
Reviewed at ov  Add cholesterol med . Blood sugar much better  ( not taking metformin

## 2021-03-15 NOTE — Patient Instructions (Signed)
Good top see you today .  Bp monitoring  increase to 10 mg amlodipine  each day  and send in readings  3 days twice a day or more  in about a month.   Continue lifestyle intervention healthy eating and exercise . Blood sugar is much better!.  Add  trial of cholesterol medication   can begin as  taking 3 days per week and increase to daily as tolerated.    Plan lab fasting in 4 months and then ROV again.   Let us know if we can help in interim .

## 2021-03-16 ENCOUNTER — Other Ambulatory Visit: Payer: Self-pay

## 2021-03-16 ENCOUNTER — Encounter: Payer: Self-pay | Admitting: Gastroenterology

## 2021-03-16 ENCOUNTER — Ambulatory Visit (AMBULATORY_SURGERY_CENTER): Payer: Self-pay | Admitting: *Deleted

## 2021-03-16 VITALS — Ht 64.0 in | Wt 186.0 lb

## 2021-03-16 DIAGNOSIS — Z1211 Encounter for screening for malignant neoplasm of colon: Secondary | ICD-10-CM

## 2021-03-16 MED ORDER — PLENVU 140 G PO SOLR
1.0000 | Freq: Once | ORAL | 0 refills | Status: AC
Start: 2021-03-16 — End: 2021-03-16

## 2021-03-16 NOTE — Progress Notes (Signed)
Pre visit completed in-person. Coupon provided.   No egg or soy allergy known to patient  No issues known to pt with past sedation with any surgeries or procedures Patient denies ever being told they had issues or difficulty with intubation  No FH of Malignant Hyperthermia Pt is not on diet pills Pt is not on  home 02  Pt is not on blood thinners  Pt denies issues with constipation  No A fib or A flutter  Pt is fully vaccinated  for Covid Code to Pharmacy and  NO PA's for preps discussed with pt In PV today  Discussed with pt there will be an out-of-pocket cost for prep and that varies from $0 to 70 +  dollars - pt verbalized understanding   Due to the COVID-19 pandemic we are asking patients to follow certain guidelines in PV and the [place]   Pt aware of COVID protocols and [facility] guidelines

## 2021-03-30 ENCOUNTER — Encounter: Payer: Self-pay | Admitting: Gastroenterology

## 2021-03-30 ENCOUNTER — Other Ambulatory Visit: Payer: Self-pay

## 2021-03-30 ENCOUNTER — Ambulatory Visit (AMBULATORY_SURGERY_CENTER): Payer: BC Managed Care – PPO | Admitting: Gastroenterology

## 2021-03-30 ENCOUNTER — Other Ambulatory Visit: Payer: Self-pay | Admitting: Gastroenterology

## 2021-03-30 VITALS — BP 156/86 | HR 72 | Temp 97.8°F | Resp 15 | Ht 63.5 in | Wt 186.0 lb

## 2021-03-30 DIAGNOSIS — D125 Benign neoplasm of sigmoid colon: Secondary | ICD-10-CM

## 2021-03-30 DIAGNOSIS — Z1211 Encounter for screening for malignant neoplasm of colon: Secondary | ICD-10-CM

## 2021-03-30 DIAGNOSIS — D123 Benign neoplasm of transverse colon: Secondary | ICD-10-CM

## 2021-03-30 MED ORDER — SODIUM CHLORIDE 0.9 % IV SOLN: 500.0000 mL | Freq: Once | INTRAVENOUS | Status: DC

## 2021-03-30 NOTE — Progress Notes (Signed)
[facility] History and Physical   Primary Care Physician:  Burnis Medin, MD   Reason for Procedure:  Colorectal cancer screening  Plan:    Screening colonoscopy with possible interventions as needed     HPI: Monica Estrada is a very pleasant 55 y.o. female here for screening colonoscopy. Denies any nausea, vomiting, abdominal pain, melena or bright red blood per rectum  The risks and benefits as well as alternatives of endoscopic procedure(s) have been discussed and reviewed. All questions answered. The patient agrees to proceed.    Past Medical History:  Diagnosis Date   Abnormal Pap smear    Asthma    Colonic edema    mild left- ed eval abd ct   Gave birth to child recently    2012   History of hypothyroidism 1999   of post partum   Hx of abnormal cervical Pap smear    Hypertension    RSV (respiratory syncytial virus infection)    with wheezing and asthmatic sx   Uveitis    stable   Wheezing 04/01/2015    Past Surgical History:  Procedure Laterality Date   CHOLECYSTECTOMY     1999   COLONOSCOPY     TUBAL LIGATION  12/31/2010   Procedure: BILATERAL TUBAL LIGATION;  Surgeon: Blane Ohara Meisinger;  Location: [facility];  Service: Gynecology;  Laterality: Bilateral;  Bilateral post partum tubal ligation    Prior to Admission medications   Medication Sig Start Date End Date Taking? Authorizing Provider  acetaZOLAMIDE (DIAMOX) 250 MG tablet Take 500 mg by mouth in the morning and at bedtime. 05/04/20  Yes [provider]  Adalimumab 40 MG/0.4ML PNKT Inject into the skin. 01/18/21  Yes [provider]  amLODipine (NORVASC) 10 MG tablet Take 1 tablet (10 mg total) by mouth daily. 03/15/21  Yes Panosh, Standley Brooking, MD  aspirin 81 MG EC tablet Take by mouth.   Yes [provider]  atropine 1 % ophthalmic solution Place 1 drop into the right eye 2 (two) times daily.   Yes [provider]  azaTHIOprine (IMURAN) 50 MG tablet Take 100  mg by mouth in the morning and at bedtime. 05/04/20  Yes [provider]  brimonidine (ALPHAGAN) 0.2 % ophthalmic solution Place 1 drop into both eyes every 8 (eight) hours. 05/13/20  Yes [provider]  dorzolamide-timolol (COSOPT) 22.3-6.8 MG/ML ophthalmic solution Place 1 drop into both eyes in the morning and at bedtime. 05/01/20  Yes [provider]  ferrous sulfate 325 (65 FE) MG tablet Take 650 mg by mouth daily with breakfast.   Yes [provider]  folic acid (FOLVITE) 1 MG tablet Take 1 tablet by mouth daily. 11/19/18  Yes [provider]  latanoprost (XALATAN) 0.005 % ophthalmic solution Place 1 drop into both eyes at bedtime. 03/22/20  Yes [provider]  lisinopril-hydrochlorothiazide (ZESTORETIC) 20-25 MG tablet TAKE 1 TABLET BY MOUTH EVERY DAY 01/28/21  Yes Panosh, Standley Brooking, MD  prednisoLONE acetate (PRED FORTE) 1 % ophthalmic suspension Place 1 drop into the left eye every 2 (two) hours. When awake. 04/11/20  Yes [provider]  tacrolimus (PROGRAF) 1 MG capsule Take by mouth. 01/18/21  Yes [provider]  valACYclovir (VALTREX) 1000 MG tablet Take 1,000 mg by mouth 3 (three) times daily. 03/11/21  Yes [provider]  albuterol (VENTOLIN HFA) 108 (90 Base) MCG/ACT inhaler Inhale 2 puffs into the lungs every 4 (four) hours as needed for wheezing or  shortness of breath. 06/30/19   Scot Jun, FNP  Fluticasone-Salmeterol (ADVAIR DISKUS) 250-50 MCG/DOSE AEPB Inhale 1 puff into the lungs in the morning and at bedtime. 05/25/20 05/25/21  [place] Donnamarie Poag, DO  ibuprofen (ADVIL) 600 MG tablet Take by mouth. 07/02/15   [provider]  ipratropium-albuterol (DUONEB) 0.5-2.5 (3) MG/3ML SOLN Take 3 mLs by nebulization every 4 (four) hours as needed (shortness of breath/wheezing). 05/25/20   [place] Donnamarie Poag, DO  nystatin ointment (MYCOSTATIN) Apply 1 application topically in the morning and at bedtime.  05/12/20   [provider]  predniSONE (DELTASONE) 20 MG tablet Take 1 tablet (20 mg total) by mouth 2 (two) times daily with a meal. If needed for  wheezing flair 11/17/20   Panosh, Standley Brooking, MD  rosuvastatin (CRESTOR) 10 MG tablet Take 1 tablet (10 mg total) by mouth daily. As directed Patient not taking: No sig reported 03/15/21   Panosh, Standley Brooking, MD  tiotropium (SPIRIVA HANDIHALER) 18 MCG inhalation capsule Place 1 capsule (18 mcg total) into inhaler and inhale daily. 05/25/20 05/25/21  [place] Donnamarie Poag, DO  triamcinolone ointment (KENALOG) 0.1 % Apply 1 application topically in the morning and at bedtime. Patient not taking: Reported on 03/30/2021 05/12/20   [provider]    Current Outpatient Medications  Medication Sig Dispense Refill   acetaZOLAMIDE (DIAMOX) 250 MG tablet Take 500 mg by mouth in the morning and at bedtime.     Adalimumab 40 MG/0.4ML PNKT Inject into the skin.     amLODipine (NORVASC) 10 MG tablet Take 1 tablet (10 mg total) by mouth daily. 90 tablet 1   aspirin 81 MG EC tablet Take by mouth.     atropine 1 % ophthalmic solution Place 1 drop into the right eye 2 (two) times daily.     azaTHIOprine (IMURAN) 50 MG tablet Take 100 mg by mouth in the morning and at bedtime.     brimonidine (ALPHAGAN) 0.2 % ophthalmic solution Place 1 drop into both eyes every 8 (eight) hours.     dorzolamide-timolol (COSOPT) 22.3-6.8 MG/ML ophthalmic solution Place 1 drop into both eyes in the morning and at bedtime.     ferrous sulfate 325 (65 FE) MG tablet Take 650 mg by mouth daily with breakfast.     folic acid (FOLVITE) 1 MG tablet Take 1 tablet by mouth daily.     latanoprost (XALATAN) 0.005 % ophthalmic solution Place 1 drop into both eyes at bedtime.     lisinopril-hydrochlorothiazide (ZESTORETIC) 20-25 MG tablet TAKE 1 TABLET BY MOUTH EVERY DAY 90 tablet 1   prednisoLONE acetate (PRED FORTE) 1 % ophthalmic suspension Place 1 drop into the left eye every 2 (two) hours.  When awake.     tacrolimus (PROGRAF) 1 MG capsule Take by mouth.     valACYclovir (VALTREX) 1000 MG tablet Take 1,000 mg by mouth 3 (three) times daily.     albuterol (VENTOLIN HFA) 108 (90 Base) MCG/ACT inhaler Inhale 2 puffs into the lungs every 4 (four) hours as needed for wheezing or shortness of breath. 18 g 1   Fluticasone-Salmeterol (ADVAIR DISKUS) 250-50 MCG/DOSE AEPB Inhale 1 puff into the lungs in the morning and at bedtime. 180 each 3   ibuprofen (ADVIL) 600 MG tablet Take by mouth.     ipratropium-albuterol (DUONEB) 0.5-2.5 (3) MG/3ML SOLN Take 3 mLs by nebulization every 4 (four) hours as needed (shortness of breath/wheezing). 540 mL 0   nystatin ointment (MYCOSTATIN) Apply 1 application topically  in the morning and at bedtime.     predniSONE (DELTASONE) 20 MG tablet Take 1 tablet (20 mg total) by mouth 2 (two) times daily with a meal. If needed for  wheezing flair 10 tablet 0   rosuvastatin (CRESTOR) 10 MG tablet Take 1 tablet (10 mg total) by mouth daily. As directed (Patient not taking: No sig reported) 90 tablet 1   tiotropium (SPIRIVA HANDIHALER) 18 MCG inhalation capsule Place 1 capsule (18 mcg total) into inhaler and inhale daily. 90 capsule 3   triamcinolone ointment (KENALOG) 0.1 % Apply 1 application topically in the morning and at bedtime. (Patient not taking: Reported on 03/30/2021)     Current Facility-Administered Medications  Medication Dose Route Frequency Provider Last Rate Last Admin   0.9 %  sodium chloride infusion  500 mL Intravenous Once Mauri Pole, MD        Allergies as of 03/30/2021   (No Known Allergies)    Family History  Problem Relation Age of Onset   Hypertension Mother    Diabetes Mother    Thyroid disease Mother    Diabetes Father    Hypertension Father    Lupus Sister    Rheum arthritis Sister    Other Sister        lamb disease   Stroke Sister    Diabetes Brother    Diabetes Son    Colon cancer Neg Hx    Colon polyps Neg Hx     Esophageal cancer Neg Hx    Rectal cancer Neg Hx    Stomach cancer Neg Hx     Social History   Socioeconomic History   Marital status: Single    Spouse name: Not on file   Number of children: Not on file   Years of education: Not on file   Highest education level: Not on file  Occupational History   Occupation: retail  Tobacco Use   Smoking status: Never   Smokeless tobacco: Never  Vaping Use   Vaping Use: Never used  Substance and Sexual Activity   Alcohol use: No   Drug use: No   Sexual activity: Not Currently  Other Topics Concern   Not on file  Social History Narrative   [name]  ABOUT 58 HOURS    Single   HH of 3 son  22 y  And baby girl 2 year  Father out of country comes back ocass. Some financial support .    Father  In [place].   No pets.    Neg ets .    2 jobs  hh of 2  7 yo and older teen son college                Social Determinants of Radio broadcast assistant Strain: Not on file  Food Insecurity: Not on file  Transportation Needs: Not on file  Physical Activity: Not on file  Stress: Not on file  Social Connections: Not on file  Intimate Partner Violence: Not on file    Review of Systems:  All other review of systems negative except as mentioned in the HPI.  Physical Exam: Vital signs in last 24 hours: BP (!) 156/101   Pulse 87   Temp 97.8 F (36.6 C)   Ht 5' 3.5" (1.613 m)   Wt 186 lb (84.4 kg)   SpO2 95%   BMI 32.43 kg/m     General:   Alert, NAD Lungs:  Clear .   Heart:  Regular rate and rhythm Abdomen:  Soft, nontender and nondistended. Neuro/Psych:  Alert and cooperative. Normal mood and affect. A and O x 3  Reviewed labs, radiology imaging, old records and pertinent past GI work up  Patient is appropriate for planned procedure(s) and anesthesia in an ambulatory setting   K. Denzil Magnuson , MD [phone]

## 2021-03-30 NOTE — Progress Notes (Signed)
Called to room to assist during endoscopic procedure.  Patient ID and intended procedure confirmed with present staff. Received instructions for my participation in the procedure from the performing physician.  

## 2021-03-30 NOTE — Op Note (Signed)
Monica Estrada Patient Name: Monica Estrada Procedure Date: 03/30/2021 10:27 AM MRN: [id number] Endoscopist: Mauri Pole , MD Age: 55 Referring MD:  Date of Birth: 1965-10-29 Gender: Female Account #: [id number] Procedure:                Colonoscopy Indications:              Screening for colorectal malignant neoplasm Medicines:                Monitored Anesthesia Care Procedure:                Pre-Anesthesia Assessment:                           - Prior to the procedure, a History and Physical                            was performed, and patient medications and                            allergies were reviewed. The patient's tolerance of                            previous anesthesia was also reviewed. The risks                            and benefits of the procedure and the sedation                            options and risks were discussed with the patient.                            All questions were answered, and informed consent                            was obtained. Prior Anticoagulants: The patient has                            taken no previous anticoagulant or antiplatelet                            agents. ASA Grade Assessment: II - A patient with                            mild systemic disease. After reviewing the risks                            and benefits, the patient was deemed in                            satisfactory condition to undergo the procedure.                           After obtaining informed consent, the colonoscope  was passed under direct vision. Throughout the                            procedure, the patient's blood pressure, pulse, and                            oxygen saturations were monitored continuously. The                            Olympus PCF-H190DL ([id number]) Colonoscope was                            introduced through the anus and advanced to the the                            cecum,  identified by appendiceal orifice and                            ileocecal valve. The colonoscopy was performed                            without difficulty. The patient tolerated the                            procedure well. The quality of the bowel                            preparation was excellent. The ileocecal valve,                            appendiceal orifice, and rectum were photographed. Scope In: 10:32:27 AM Scope Out: 10:53:37 AM Scope Withdrawal Time: 0 hours 15 minutes 56 seconds  Total Procedure Duration: 0 hours 21 minutes 10 seconds  Findings:                 The perianal and digital rectal examinations were                            normal.                           Two sessile polyps were found in the sigmoid colon                            and transverse colon. The polyps were 3 to 4 mm in                            size. These polyps were removed with a cold snare.                            Resection and retrieval were complete.                           A few small-mouthed diverticula were found in the  sigmoid colon.                           Non-bleeding external and internal hemorrhoids were                            found during retroflexion. The hemorrhoids were                            small. Complications:            No immediate complications. Estimated Blood Loss:     Estimated blood loss was minimal. Impression:               - Two 3 to 4 mm polyps in the sigmoid colon and in                            the transverse colon, removed with a cold snare.                            Resected and retrieved.                           - Diverticulosis in the sigmoid colon.                           - Non-bleeding external and internal hemorrhoids. Recommendation:           - Patient has a contact number available for                            emergencies. The signs and symptoms of potential                            delayed  complications were discussed with the                            patient. Return to normal activities tomorrow.                            Written discharge instructions were provided to the                            patient.                           - Resume previous diet.                           - Continue present medications.                           - Await pathology results.                           - Repeat colonoscopy in 5-10 years for surveillance  based on pathology results. Mauri Pole, MD 03/30/2021 10:58:27 AM This report has been signed electronically.

## 2021-03-30 NOTE — Progress Notes (Signed)
Pt's states no medical or surgical changes since previsit or office visit.   VS taken by DT 

## 2021-03-30 NOTE — Progress Notes (Signed)
Report given to PACU, vss 

## 2021-03-30 NOTE — Patient Instructions (Signed)
Resume previous diet and medications. Awaiting pathology results. Repeat Colonoscopy in 5-10 years for surveillance based on pathology results.  YOU HAD AN ENDOSCOPIC PROCEDURE TODAY AT [facility]:   Refer to the procedure report that was given to you for any specific questions about what was found during the examination.  If the procedure report does not answer your questions, please call your gastroenterologist to clarify.  If you requested that your care partner not be given the details of your procedure findings, then the procedure report has been included in a sealed envelope for you to review at your convenience later.  YOU SHOULD EXPECT: Some feelings of bloating in the abdomen. Passage of more gas than usual.  Walking can help get rid of the air that was put into your GI tract during the procedure and reduce the bloating. If you had a lower endoscopy (such as a colonoscopy or flexible sigmoidoscopy) you may notice spotting of blood in your stool or on the toilet paper. If you underwent a bowel prep for your procedure, you may not have a normal bowel movement for a few days.  Please Note:  You might notice some irritation and congestion in your nose or some drainage.  This is from the oxygen used during your procedure.  There is no need for concern and it should clear up in a day or so.  SYMPTOMS TO REPORT IMMEDIATELY:  Following lower endoscopy (colonoscopy or flexible sigmoidoscopy):  Excessive amounts of blood in the stool  Significant tenderness or worsening of abdominal pains  Swelling of the abdomen that is new, acute  Fever of 100F or higher  For urgent or emergent issues, a gastroenterologist can be reached at any hour by calling [phone]. Do not use MyChart messaging for urgent concerns.    DIET:  We do recommend a small meal at first, but then you may proceed to your regular diet.  Drink plenty of fluids but you should avoid alcoholic beverages for 24  hours.  ACTIVITY:  You should plan to take it easy for the rest of today and you should NOT DRIVE or use heavy machinery until tomorrow (because of the sedation medicines used during the test).    FOLLOW UP: Our staff will call the number listed on your records 48-72 hours following your procedure to check on you and address any questions or concerns that you may have regarding the information given to you following your procedure. If we do not reach you, we will leave a message.  We will attempt to reach you two times.  During this call, we will ask if you have developed any symptoms of COVID 19. If you develop any symptoms (ie: fever, flu-like symptoms, shortness of breath, cough etc.) before then, please call [phone].  If you test positive for Covid 19 in the 2 weeks post procedure, please call and report this information to [place].    If any biopsies were taken you will be contacted by phone or by letter within the next 1-3 weeks.  Please call us at [phone] if you have not heard about the biopsies in 3 weeks.    SIGNATURES/CONFIDENTIALITY: You and/or your care partner have signed paperwork which will be entered into your electronic medical record.  These signatures attest to the fact that that the information above on your After Visit Summary has been reviewed and is understood.  Full responsibility of the confidentiality of this discharge information lies with you and/or your care-partner.

## 2021-04-01 ENCOUNTER — Telehealth: Payer: Self-pay

## 2021-04-01 NOTE — Telephone Encounter (Signed)
  Follow up Call-  Call back number 03/30/2021  Post procedure Call Back phone  # [phone]  Permission to leave phone message Yes  Some recent data might be hidden     Patient questions:  Do you have a fever, pain , or abdominal swelling? No. Pain Score  0 *  Have you tolerated food without any problems? Yes.    Have you been able to return to your normal activities? Yes.    Do you have any questions about your discharge instructions: Diet   No. Medications  No. Follow up visit  No.  Do you have questions or concerns about your Care? No.  Actions: * If pain score is 4 or above: No action needed, pain <4.  Have you developed a fever since your procedure? no  2.   Have you had an respiratory symptoms (SOB or cough) since your procedure? no  3.   Have you tested positive for COVID 19 since your procedure no  4.   Have you had any family members/close contacts diagnosed with the COVID 19 since your procedure?  no   If yes to any of these questions please route to Joylene John, RN and Joella Prince, RN

## 2021-04-12 DIAGNOSIS — H35351 Cystoid macular degeneration, right eye: Secondary | ICD-10-CM | POA: Diagnosis not present

## 2021-04-12 DIAGNOSIS — H44113 Panuveitis, bilateral: Secondary | ICD-10-CM | POA: Diagnosis not present

## 2021-04-12 DIAGNOSIS — H2513 Age-related nuclear cataract, bilateral: Secondary | ICD-10-CM | POA: Diagnosis not present

## 2021-04-12 DIAGNOSIS — H21541 Posterior synechiae (iris), right eye: Secondary | ICD-10-CM | POA: Diagnosis not present

## 2021-04-15 ENCOUNTER — Encounter: Payer: Self-pay | Admitting: Gastroenterology

## 2021-04-15 DIAGNOSIS — Z79899 Other long term (current) drug therapy: Secondary | ICD-10-CM | POA: Diagnosis not present

## 2021-04-15 DIAGNOSIS — H40032 Anatomical narrow angle, left eye: Secondary | ICD-10-CM | POA: Diagnosis not present

## 2021-04-15 DIAGNOSIS — H44113 Panuveitis, bilateral: Secondary | ICD-10-CM | POA: Diagnosis not present

## 2021-04-15 DIAGNOSIS — H2512 Age-related nuclear cataract, left eye: Secondary | ICD-10-CM | POA: Diagnosis not present

## 2021-04-21 DIAGNOSIS — H2512 Age-related nuclear cataract, left eye: Secondary | ICD-10-CM | POA: Diagnosis not present

## 2021-04-21 DIAGNOSIS — H21512 Anterior synechiae (iris), left eye: Secondary | ICD-10-CM | POA: Diagnosis not present

## 2021-04-21 DIAGNOSIS — H268 Other specified cataract: Secondary | ICD-10-CM | POA: Diagnosis not present

## 2021-04-21 DIAGNOSIS — H21502 Unspecified adhesions of iris, left eye: Secondary | ICD-10-CM | POA: Diagnosis not present

## 2021-06-15 ENCOUNTER — Other Ambulatory Visit: Payer: BC Managed Care – PPO

## 2021-07-13 ENCOUNTER — Other Ambulatory Visit: Payer: BC Managed Care – PPO

## 2021-07-18 ENCOUNTER — Other Ambulatory Visit (INDEPENDENT_AMBULATORY_CARE_PROVIDER_SITE_OTHER): Payer: Medicaid Other

## 2021-07-18 DIAGNOSIS — I10 Essential (primary) hypertension: Secondary | ICD-10-CM

## 2021-07-18 DIAGNOSIS — Z79899 Other long term (current) drug therapy: Secondary | ICD-10-CM | POA: Diagnosis not present

## 2021-07-18 DIAGNOSIS — E785 Hyperlipidemia, unspecified: Secondary | ICD-10-CM | POA: Diagnosis not present

## 2021-07-18 DIAGNOSIS — E1169 Type 2 diabetes mellitus with other specified complication: Secondary | ICD-10-CM | POA: Diagnosis not present

## 2021-07-18 LAB — LIPID PANEL
Cholesterol: 151 mg/dL (ref 0–200)
HDL: 42.8 mg/dL (ref >39.00)
LDL Cholesterol: 90 mg/dL (ref 0–99)
NonHDL: 108.26
Total CHOL/HDL Ratio: 4
Triglycerides: 92 mg/dL (ref 0.0–149.0)
VLDL: 18.4 mg/dL (ref 0.0–40.0)

## 2021-07-18 LAB — TSH: TSH: 4.88 u[IU]/mL (ref 0.35–5.50)

## 2021-07-18 LAB — HEMOGLOBIN A1C: Hgb A1c MFr Bld: 7.7 % — ABNORMAL HIGH (ref 4.6–6.5)

## 2021-07-20 ENCOUNTER — Encounter: Payer: Self-pay | Admitting: Internal Medicine

## 2021-07-20 ENCOUNTER — Ambulatory Visit (INDEPENDENT_AMBULATORY_CARE_PROVIDER_SITE_OTHER): Payer: Medicaid Other | Admitting: Internal Medicine

## 2021-07-20 VITALS — BP 124/86 | HR 78 | Temp 98.7°F | Ht 63.5 in | Wt 178.2 lb

## 2021-07-20 DIAGNOSIS — Z79899 Other long term (current) drug therapy: Secondary | ICD-10-CM

## 2021-07-20 DIAGNOSIS — I10 Essential (primary) hypertension: Secondary | ICD-10-CM

## 2021-07-20 DIAGNOSIS — E1165 Type 2 diabetes mellitus with hyperglycemia: Secondary | ICD-10-CM

## 2021-07-20 DIAGNOSIS — E785 Hyperlipidemia, unspecified: Secondary | ICD-10-CM | POA: Diagnosis not present

## 2021-07-20 DIAGNOSIS — E063 Autoimmune thyroiditis: Secondary | ICD-10-CM

## 2021-07-20 DIAGNOSIS — E669 Obesity, unspecified: Secondary | ICD-10-CM

## 2021-07-20 NOTE — Progress Notes (Signed)
Chief Complaint  Patient presents with   Follow-up    HPI: Monica Estrada 56 y.o. come in for Fu  Bp blood sugar and HLD  Has been working on lifestyle and lost some weight  Bp down to 120/80  and 124/84 .  Not checking sugar decided no med at this time "takes so much med anyway" would like to dol si change.  Never took the statin wanted to see  how lsi effected lipids   Under active rx autoimmune ey disease imuran and  humira  last cbc low wbc anc around 1000 Vision is better than previous 20 50 ROS: See pertinent positives and negatives per HPI.  Past Medical History:  Diagnosis Date   Abnormal Pap smear    Asthma    Colonic edema    mild left- ed eval abd ct   Gave birth to child recently    2012   History of hypothyroidism 1999   of post partum   Hx of abnormal cervical Pap smear    Hypertension    RSV (respiratory syncytial virus infection)    with wheezing and asthmatic sx   Uveitis    stable   Wheezing 04/01/2015    Family History  Problem Relation Age of Onset   Hypertension Mother    Diabetes Mother    Thyroid disease Mother    Diabetes Father    Hypertension Father    Lupus Sister    Rheum arthritis Sister    Other Sister        lamb disease   Stroke Sister    Diabetes Brother    Diabetes Son    Colon cancer Neg Hx    Colon polyps Neg Hx    Esophageal cancer Neg Hx    Rectal cancer Neg Hx    Stomach cancer Neg Hx     Social History   Socioeconomic History   Marital status: Single    Spouse name: Not on file   Number of children: Not on file   Years of education: Not on file   Highest education level: Not on file  Occupational History   Occupation: retail  Tobacco Use   Smoking status: Never   Smokeless tobacco: Never  Vaping Use   Vaping Use: Never used  Substance and Sexual Activity   Alcohol use: No   Drug use: No   Sexual activity: Not Currently  Other Topics Concern   Not on file  Social History Narrative   [name]   ABOUT 10 HOURS    Single   HH of 3 son  70 y  And baby girl 2 year  Father out of country comes back ocass. Some financial support .    Father  In [place].   No pets.    Neg ets .    2 jobs  hh of 2  32 yo and older teen son college                Social Determinants of Radio broadcast assistant Strain: Not on file  Food Insecurity: Not on file  Transportation Needs: Not on file  Physical Activity: Not on file  Stress: Not on file  Social Connections: Not on file    Outpatient Medications Prior to Visit  Medication Sig Dispense Refill   acetaZOLAMIDE (DIAMOX) 250 MG tablet Take 500 mg by mouth in the morning and at bedtime.     Adalimumab 40 MG/0.4ML PNKT Inject into the skin.  albuterol (VENTOLIN HFA) 108 (90 Base) MCG/ACT inhaler Inhale 2 puffs into the lungs every 4 (four) hours as needed for wheezing or shortness of breath. 18 g 1   amLODipine (NORVASC) 10 MG tablet Take 1 tablet (10 mg total) by mouth daily. 90 tablet 1   aspirin 81 MG EC tablet Take by mouth.     atropine 1 % ophthalmic solution Place 1 drop into the right eye 2 (two) times daily.     azaTHIOprine (IMURAN) 50 MG tablet Take 100 mg by mouth in the morning and at bedtime.     brimonidine (ALPHAGAN) 0.2 % ophthalmic solution Place 1 drop into both eyes every 8 (eight) hours.     dorzolamide-timolol (COSOPT) 22.3-6.8 MG/ML ophthalmic solution Place 1 drop into both eyes in the morning and at bedtime.     ferrous sulfate 325 (65 FE) MG tablet Take 650 mg by mouth daily with breakfast.     folic acid (FOLVITE) 1 MG tablet Take 1 tablet by mouth daily.     ibuprofen (ADVIL) 600 MG tablet Take by mouth.     ipratropium-albuterol (DUONEB) 0.5-2.5 (3) MG/3ML SOLN Take 3 mLs by nebulization every 4 (four) hours as needed (shortness of breath/wheezing). 540 mL 0   latanoprost (XALATAN) 0.005 % ophthalmic solution Place 1 drop into both eyes at bedtime.     lisinopril-hydrochlorothiazide (ZESTORETIC) 20-25 MG tablet  TAKE 1 TABLET BY MOUTH EVERY DAY 90 tablet 1   nystatin ointment (MYCOSTATIN) Apply 1 application topically in the morning and at bedtime.     prednisoLONE acetate (PRED FORTE) 1 % ophthalmic suspension Place 1 drop into the left eye every 2 (two) hours. When awake.     predniSONE (DELTASONE) 20 MG tablet Take 1 tablet (20 mg total) by mouth 2 (two) times daily with a meal. If needed for  wheezing flair 10 tablet 0   tacrolimus (PROGRAF) 1 MG capsule Take by mouth.     triamcinolone ointment (KENALOG) 0.1 % Apply 1 application. topically in the morning and at bedtime.     valACYclovir (VALTREX) 1000 MG tablet Take 1,000 mg by mouth 3 (three) times daily.     rosuvastatin (CRESTOR) 10 MG tablet Take 1 tablet (10 mg total) by mouth daily. As directed 90 tablet 1   Fluticasone-Salmeterol (ADVAIR DISKUS) 250-50 MCG/DOSE AEPB Inhale 1 puff into the lungs in the morning and at bedtime. 180 each 3   rosuvastatin (CRESTOR) 10 MG tablet Take 1 tablet (10 mg total) by mouth once a week. As directed 90 tablet 1   tiotropium (SPIRIVA HANDIHALER) 18 MCG inhalation capsule Place 1 capsule (18 mcg total) into inhaler and inhale daily. 90 capsule 3   No facility-administered medications prior to visit.     EXAM:  BP 124/86 (BP Location: Left Arm, Patient Position: Sitting, Cuff Size: Normal)    Pulse 78    Temp 98.7 F (37.1 C) (Oral)    Ht 5' 3.5" (1.613 m)    Wt 178 lb 3.2 oz (80.8 kg)    SpO2 98%    BMI 31.07 kg/m   Body mass index is 31.07 kg/m.  GENERAL: vitals reviewed and listed above, alert, oriented, appears well hydrated and in no acute distress HEENT: atraumatic, conjunctiva  clear, no obvious abnormalities on inspection of external nose and ears  in dark glasses OP : masked  NECK: no obvious masses on inspection palpation  MS: moves all extremities without noticeable focal  abnormality PSYCH: pleasant and cooperative,  no obvious depression or anxiety Lab Results  Component Value Date    WBC 3.8 (L) 11/02/2020   HGB 13.9 11/02/2020   HCT 41.0 11/02/2020   PLT 170.0 11/02/2020   GLUCOSE 111 (H) 03/08/2021   CHOL 151 07/18/2021   TRIG 92.0 07/18/2021   HDL 42.80 07/18/2021   LDLDIRECT 144.5 10/31/2010   LDLCALC 90 07/18/2021   ALT 13 09/24/2017   AST 14 09/24/2017   NA 141 03/08/2021   K 3.5 03/08/2021   CL 100 03/08/2021   CREATININE 0.74 03/08/2021   BUN 13 03/08/2021   CO2 30 03/08/2021   TSH 4.88 07/18/2021   HGBA1C 7.7 (H) 07/18/2021   BP Readings from Last 3 Encounters:  07/20/21 124/86  03/30/21 (!) 156/86  03/15/21 (!) 160/98   Wt Readings from Last 3 Encounters:  07/20/21 178 lb 3.2 oz (80.8 kg)  03/30/21 186 lb (84.4 kg)  03/16/21 186 lb (84.4 kg)  Reviewed  labs   ASSESSMENT AND PLAN:  Discussed the following assessment and plan:  Essential hypertension - appears controlled continue - Plan: Lipid panel, Hemoglobin A1c  Medication management - Plan: Lipid panel, Hemoglobin A1c  Hyperlipidemia, unspecified hyperlipidemia type - ldl  90 improved  but goal 70 below and will try statin med once a week agrees to this - Plan: Lipid panel, Hemoglobin A1c  Type 2 diabetes mellitus with hyperglycemia, without long-term current use of insulin (HCC) - up to 7.7 reluctant to add medication yet  agreed to close fu 3-4 mos  and add med if not successful . a1c in 2-3 mos and then visit - Plan: Lipid panel, Hemoglobin A1c  Obesity, Class I, BMI 30-34.9 - lose 10 mor# to get bmi under 30  AUTOIMMUNE THYROIDITIS - follow  -Patient advised to return or notify health care team  if  new concerns arise.  Counseled and plans record review 32 minutes  Patient Instructions  Glad your BP is better .  And cholesterol   still try taking the rosuvastatin once a week.    Your sugar  is up however   advise in  addition medication but  could try  dietary still Contact us if we can rx  monitoring machine and strips to check BG.   Plan 3-4 month lab again and follow  up.   Lab Results  Component Value Date   WBC 3.8 (L) 11/02/2020   HGB 13.9 11/02/2020   HCT 41.0 11/02/2020   PLT 170.0 11/02/2020   GLUCOSE 111 (H) 03/08/2021   CHOL 151 07/18/2021   TRIG 92.0 07/18/2021   HDL 42.80 07/18/2021   LDLDIRECT 144.5 10/31/2010   LDLCALC 90 07/18/2021   ALT 13 09/24/2017   AST 14 09/24/2017   NA 141 03/08/2021   K 3.5 03/08/2021   CL 100 03/08/2021   CREATININE 0.74 03/08/2021   BUN 13 03/08/2021   CO2 30 03/08/2021   TSH 4.88 07/18/2021   HGBA1C 7.7 (H) 07/18/2021      Abdoulie Tierce K. Kosisochukwu Goldberg M.D.

## 2021-07-20 NOTE — Patient Instructions (Addendum)
Glad your BP is better .  ?And cholesterol   still try taking the rosuvastatin once a week.   ? ?Your sugar  is up however   advise in  addition medication but  could try  dietary still ?Contact us if we can rx  monitoring machine and strips to check BG.  ? ?Plan 3-4 month lab again and follow up.  ? ?Lab Results  ?Component Value Date  ? WBC 3.8 (L) 11/02/2020  ? HGB 13.9 11/02/2020  ? HCT 41.0 11/02/2020  ? PLT 170.0 11/02/2020  ? GLUCOSE 111 (H) 03/08/2021  ? CHOL 151 07/18/2021  ? TRIG 92.0 07/18/2021  ? HDL 42.80 07/18/2021  ? LDLDIRECT 144.5 10/31/2010  ? Center 90 07/18/2021  ? ALT 13 09/24/2017  ? AST 14 09/24/2017  ? NA 141 03/08/2021  ? K 3.5 03/08/2021  ? CL 100 03/08/2021  ? CREATININE 0.74 03/08/2021  ? BUN 13 03/08/2021  ? CO2 30 03/08/2021  ? TSH 4.88 07/18/2021  ? HGBA1C 7.7 (H) 07/18/2021  ? ? ? ? ?

## 2021-08-24 ENCOUNTER — Other Ambulatory Visit: Payer: Self-pay | Admitting: Internal Medicine

## 2021-09-05 ENCOUNTER — Other Ambulatory Visit: Payer: Self-pay | Admitting: Internal Medicine

## 2021-10-25 NOTE — Progress Notes (Deleted)
No chief complaint on file.   HPI: Monica Estrada 56 y.o. come in for Chronic disease management   HT seen 3 2023  Was to try weekly rosuvastatin  BG  ROS: See pertinent positives and negatives per HPI.  Past Medical History:  Diagnosis Date   Abnormal Pap smear    Asthma    Colonic edema    mild left- ed eval abd ct   Gave birth to child recently    2012   History of hypothyroidism 1999   of post partum   Hx of abnormal cervical Pap smear    Hypertension    RSV (respiratory syncytial virus infection)    with wheezing and asthmatic sx   Uveitis    stable   Wheezing 04/01/2015    Family History  Problem Relation Age of Onset   Hypertension Mother    Diabetes Mother    Thyroid disease Mother    Diabetes Father    Hypertension Father    Lupus Sister    Rheum arthritis Sister    Other Sister        lamb disease   Stroke Sister    Diabetes Brother    Diabetes Son    Colon cancer Neg Hx    Colon polyps Neg Hx    Esophageal cancer Neg Hx    Rectal cancer Neg Hx    Stomach cancer Neg Hx     Social History   Socioeconomic History   Marital status: Single    Spouse name: Not on file   Number of children: Not on file   Years of education: Not on file   Highest education level: Not on file  Occupational History   Occupation: retail  Tobacco Use   Smoking status: Never   Smokeless tobacco: Never  Vaping Use   Vaping Use: Never used  Substance and Sexual Activity   Alcohol use: No   Drug use: No   Sexual activity: Not Currently  Other Topics Concern   Not on file  Social History Narrative   [name]  ABOUT 24 HOURS    Single   HH of 3 son  69 y  And baby girl 2 year  Father out of country comes back ocass. Some financial support .    Father  In [place].   No pets.    Neg ets .    2 jobs  hh of 2  65 yo and older teen son college                Social Determinants of Radio broadcast assistant Strain: Not on file  Food Insecurity: Not on file   Transportation Needs: Not on file  Physical Activity: Not on file  Stress: Not on file  Social Connections: Not on file    Outpatient Medications Prior to Visit  Medication Sig Dispense Refill   acetaZOLAMIDE (DIAMOX) 250 MG tablet Take 500 mg by mouth in the morning and at bedtime.     Adalimumab 40 MG/0.4ML PNKT Inject into the skin.     albuterol (VENTOLIN HFA) 108 (90 Base) MCG/ACT inhaler Inhale 2 puffs into the lungs every 4 (four) hours as needed for wheezing or shortness of breath. 18 g 1   amLODipine (NORVASC) 10 MG tablet TAKE 1 TABLET BY MOUTH EVERY DAY 90 tablet 1   aspirin 81 MG EC tablet Take by mouth.     atropine 1 % ophthalmic solution Place 1 drop into the  right eye 2 (two) times daily.     azaTHIOprine (IMURAN) 50 MG tablet Take 100 mg by mouth in the morning and at bedtime.     brimonidine (ALPHAGAN) 0.2 % ophthalmic solution Place 1 drop into both eyes every 8 (eight) hours.     dorzolamide-timolol (COSOPT) 22.3-6.8 MG/ML ophthalmic solution Place 1 drop into both eyes in the morning and at bedtime.     ferrous sulfate 325 (65 FE) MG tablet Take 650 mg by mouth daily with breakfast.     Fluticasone-Salmeterol (ADVAIR DISKUS) 250-50 MCG/DOSE AEPB Inhale 1 puff into the lungs in the morning and at bedtime. 841 each 3   folic acid (FOLVITE) 1 MG tablet Take 1 tablet by mouth daily.     ibuprofen (ADVIL) 600 MG tablet Take by mouth.     ipratropium-albuterol (DUONEB) 0.5-2.5 (3) MG/3ML SOLN Take 3 mLs by nebulization every 4 (four) hours as needed (shortness of breath/wheezing). 540 mL 0   latanoprost (XALATAN) 0.005 % ophthalmic solution Place 1 drop into both eyes at bedtime.     lisinopril-hydrochlorothiazide (ZESTORETIC) 20-25 MG tablet TAKE 1 TABLET BY MOUTH EVERY DAY 90 tablet 1   nystatin ointment (MYCOSTATIN) Apply 1 application topically in the morning and at bedtime.     prednisoLONE acetate (PRED FORTE) 1 % ophthalmic suspension Place 1 drop into the left eye  every 2 (two) hours. When awake.     predniSONE (DELTASONE) 20 MG tablet Take 1 tablet (20 mg total) by mouth 2 (two) times daily with a meal. If needed for  wheezing flair 10 tablet 0   rosuvastatin (CRESTOR) 10 MG tablet TAKE 1 TABLET BY MOUTH DAILY. AS DIRECTED 90 tablet 1   tacrolimus (PROGRAF) 1 MG capsule Take by mouth.     tiotropium (SPIRIVA HANDIHALER) 18 MCG inhalation capsule Place 1 capsule (18 mcg total) into inhaler and inhale daily. 90 capsule 3   triamcinolone ointment (KENALOG) 0.1 % Apply 1 application. topically in the morning and at bedtime.     valACYclovir (VALTREX) 1000 MG tablet Take 1,000 mg by mouth 3 (three) times daily.     No facility-administered medications prior to visit.     EXAM:  There were no vitals taken for this visit.  There is no height or weight on file to calculate BMI.  GENERAL: vitals reviewed and listed above, alert, oriented, appears well hydrated and in no acute distress HEENT: atraumatic, conjunctiva  clear, no obvious abnormalities on inspection of external nose and ears OP : no lesion edema or exudate  NECK: no obvious masses on inspection palpation  LUNGS: clear to auscultation bilaterally, no wheezes, rales or rhonchi, good air movement CV: HRRR, no clubbing cyanosis or  peripheral edema nl cap refill  MS: moves all extremities without noticeable focal  abnormality PSYCH: pleasant and cooperative, no obvious depression or anxiety Lab Results  Component Value Date   WBC 3.8 (L) 11/02/2020   HGB 13.9 11/02/2020   HCT 41.0 11/02/2020   PLT 170.0 11/02/2020   GLUCOSE 111 (H) 03/08/2021   CHOL 151 07/18/2021   TRIG 92.0 07/18/2021   HDL 42.80 07/18/2021   LDLDIRECT 144.5 10/31/2010   LDLCALC 90 07/18/2021   ALT 13 09/24/2017   AST 14 09/24/2017   NA 141 03/08/2021   K 3.5 03/08/2021   CL 100 03/08/2021   CREATININE 0.74 03/08/2021   BUN 13 03/08/2021   CO2 30 03/08/2021   TSH 4.88 07/18/2021   HGBA1C 7.7 (H) 07/18/2021  BP Readings from Last 3 Encounters:  07/20/21 124/86  03/30/21 (!) 156/86  03/15/21 (!) 160/98    ASSESSMENT AND PLAN:  Discussed the following assessment and plan:  Essential hypertension  Medication management  Type 2 diabetes mellitus with hyperglycemia, without long-term current use of insulin ([place])  AUTOIMMUNE THYROIDITIS  -Patient advised to return or notify health care team  if  new concerns arise.  There are no Patient Instructions on file for this visit.   Standley Brooking. Jaielle Dlouhy M.D.

## 2021-10-26 ENCOUNTER — Ambulatory Visit: Payer: Medicaid Other | Admitting: Internal Medicine

## 2021-10-26 DIAGNOSIS — Z79899 Other long term (current) drug therapy: Secondary | ICD-10-CM

## 2021-10-26 DIAGNOSIS — E063 Autoimmune thyroiditis: Secondary | ICD-10-CM

## 2021-10-26 DIAGNOSIS — I10 Essential (primary) hypertension: Secondary | ICD-10-CM

## 2021-10-26 DIAGNOSIS — E1165 Type 2 diabetes mellitus with hyperglycemia: Secondary | ICD-10-CM

## 2021-12-09 ENCOUNTER — Other Ambulatory Visit (INDEPENDENT_AMBULATORY_CARE_PROVIDER_SITE_OTHER): Payer: Medicaid Other

## 2021-12-09 DIAGNOSIS — E1165 Type 2 diabetes mellitus with hyperglycemia: Secondary | ICD-10-CM

## 2021-12-09 DIAGNOSIS — Z79899 Other long term (current) drug therapy: Secondary | ICD-10-CM | POA: Diagnosis not present

## 2021-12-09 DIAGNOSIS — E785 Hyperlipidemia, unspecified: Secondary | ICD-10-CM

## 2021-12-09 DIAGNOSIS — I10 Essential (primary) hypertension: Secondary | ICD-10-CM | POA: Diagnosis not present

## 2021-12-09 LAB — LIPID PANEL
Cholesterol: 143 mg/dL (ref 0–200)
HDL: 38.8 mg/dL — ABNORMAL LOW (ref >39.00)
LDL Cholesterol: 91 mg/dL (ref 0–99)
NonHDL: 104.38
Total CHOL/HDL Ratio: 4
Triglycerides: 69 mg/dL (ref 0.0–149.0)
VLDL: 13.8 mg/dL (ref 0.0–40.0)

## 2021-12-09 LAB — HEMOGLOBIN A1C: Hgb A1c MFr Bld: 8 % — ABNORMAL HIGH (ref 4.6–6.5)

## 2021-12-11 NOTE — Progress Notes (Signed)
A1c is up higher , LIPIDS  stable . Will review upcoming visit this week

## 2021-12-13 ENCOUNTER — Ambulatory Visit (INDEPENDENT_AMBULATORY_CARE_PROVIDER_SITE_OTHER): Payer: Medicaid Other | Admitting: Internal Medicine

## 2021-12-13 ENCOUNTER — Encounter: Payer: Self-pay | Admitting: Internal Medicine

## 2021-12-13 VITALS — BP 118/88 | HR 81 | Temp 98.1°F | Wt 179.2 lb

## 2021-12-13 DIAGNOSIS — I10 Essential (primary) hypertension: Secondary | ICD-10-CM | POA: Diagnosis not present

## 2021-12-13 DIAGNOSIS — E1165 Type 2 diabetes mellitus with hyperglycemia: Secondary | ICD-10-CM

## 2021-12-13 DIAGNOSIS — E785 Hyperlipidemia, unspecified: Secondary | ICD-10-CM

## 2021-12-13 DIAGNOSIS — Z79899 Other long term (current) drug therapy: Secondary | ICD-10-CM | POA: Diagnosis not present

## 2021-12-13 DIAGNOSIS — Z634 Disappearance and death of family member: Secondary | ICD-10-CM

## 2021-12-13 DIAGNOSIS — E1169 Type 2 diabetes mellitus with other specified complication: Secondary | ICD-10-CM

## 2021-12-13 DIAGNOSIS — E669 Obesity, unspecified: Secondary | ICD-10-CM | POA: Diagnosis not present

## 2021-12-13 MED ORDER — DAPAGLIFLOZIN PROPANEDIOL 10 MG PO TABS: 10.0000 mg | ORAL_TABLET | Freq: Every day | ORAL | 3 refills | Status: DC

## 2021-12-13 MED ORDER — BLOOD GLUCOSE MONITOR KIT: PACK | 0 refills | Status: DC

## 2021-12-13 NOTE — Progress Notes (Signed)
Chief Complaint  Patient presents with   Follow-up   Hyperlipidemia   Diabetes    HPI: Monica Estrada 56 y.o. come in for Chronic disease management  3 deaths in family  recently  so not paying as much attnetion to  healthy diet and activity  Didn't start the crestor Has taken the metformin cause of se  gi . Doesn't have  bg monitoring plan . 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 No current cp sob  bleeding   Past Medical History:  Diagnosis Date   Abnormal Pap smear    Asthma    Colonic edema    mild left- ed eval abd ct   Gave birth to child recently    2012   History of hypothyroidism 1999   of post partum   Hx of abnormal cervical Pap smear    Hypertension    RSV (respiratory syncytial virus infection)    with wheezing and asthmatic sx   Uveitis    stable   Wheezing 04/01/2015    Family History  Problem Relation Age of Onset   Hypertension Mother    Diabetes Mother    Thyroid disease Mother    Diabetes Father    Hypertension Father    Lupus Sister    Rheum arthritis Sister    Other Sister        lamb disease   Stroke Sister    Diabetes Brother    Diabetes Son    Colon cancer Neg Hx    Colon polyps Neg Hx    Esophageal cancer Neg Hx    Rectal cancer Neg Hx    Stomach cancer Neg Hx     Social History   Socioeconomic History   Marital status: Single    Spouse name: Not on file   Number of children: Not on file   Years of education: Not on file   Highest education level: Not on file  Occupational History   Occupation: retail  Tobacco Use   Smoking status: Never   Smokeless tobacco: Never  Vaping Use   Vaping Use: Never used  Substance and Sexual Activity   Alcohol use: No   Drug use: No   Sexual activity: Not Currently  Other Topics Concern   Not on file  Social History Narrative   [name]  ABOUT 14 HOURS     Single   HH of 3 son  12 y  And baby girl 2 year  Father out of country comes back ocass. Some financial support .    Father  In [place].   No pets.    Neg ets .    2 jobs  hh of 2  73 yo and older teen son college                Social Determinants of Radio broadcast assistant Strain: Not on file  Food Insecurity: Not on file  Transportation Needs: Not on file  Physical Activity: Not on file  Stress: Not on file  Social Connections: Not on file    Outpatient Medications Prior to Visit  Medication Sig Dispense Refill   acetaZOLAMIDE (DIAMOX) 250 MG tablet Take 500 mg by mouth in the morning and at bedtime.     Adalimumab 40 MG/0.4ML PNKT Inject into the skin.     albuterol (  VENTOLIN HFA) 108 (90 Base) MCG/ACT inhaler Inhale 2 puffs into the lungs every 4 (four) hours as needed for wheezing or shortness of breath. 18 g 1   amLODipine (NORVASC) 10 MG tablet TAKE 1 TABLET BY MOUTH EVERY DAY 90 tablet 1   aspirin 81 MG EC tablet Take by mouth.     atropine 1 % ophthalmic solution Place 1 drop into the right eye 2 (two) times daily.     azaTHIOprine (IMURAN) 50 MG tablet Take 100 mg by mouth in the morning and at bedtime.     brimonidine (ALPHAGAN) 0.2 % ophthalmic solution Place 1 drop into both eyes every 8 (eight) hours.     dorzolamide-timolol (COSOPT) 22.3-6.8 MG/ML ophthalmic solution Place 1 drop into both eyes in the morning and at bedtime.     ferrous sulfate 325 (65 FE) MG tablet Take 650 mg by mouth daily with breakfast.     Fluticasone-Salmeterol (ADVAIR DISKUS) 250-50 MCG/DOSE AEPB Inhale 1 puff into the lungs in the morning and at bedtime. 563 each 3   folic acid (FOLVITE) 1 MG tablet Take 1 tablet by mouth daily.     ibuprofen (ADVIL) 600 MG tablet Take by mouth.     ipratropium-albuterol (DUONEB) 0.5-2.5 (3) MG/3ML SOLN Take 3 mLs by nebulization every 4 (four) hours as needed (shortness of breath/wheezing). 540 mL 0   latanoprost (XALATAN) 0.005 % ophthalmic solution  Place 1 drop into both eyes at bedtime.     lisinopril-hydrochlorothiazide (ZESTORETIC) 20-25 MG tablet TAKE 1 TABLET BY MOUTH EVERY DAY 90 tablet 1   nystatin ointment (MYCOSTATIN) Apply 1 application topically in the morning and at bedtime.     prednisoLONE acetate (PRED FORTE) 1 % ophthalmic suspension Place 1 drop into the left eye every 2 (two) hours. When awake.     predniSONE (DELTASONE) 20 MG tablet Take 1 tablet (20 mg total) by mouth 2 (two) times daily with a meal. If needed for  wheezing flair 10 tablet 0   rosuvastatin (CRESTOR) 10 MG tablet TAKE 1 TABLET BY MOUTH DAILY. AS DIRECTED 90 tablet 1   tacrolimus (PROGRAF) 1 MG capsule Take by mouth.     tiotropium (SPIRIVA HANDIHALER) 18 MCG inhalation capsule Place 1 capsule (18 mcg total) into inhaler and inhale daily. 90 capsule 3   triamcinolone ointment (KENALOG) 0.1 % Apply 1 application. topically in the morning and at bedtime.     valACYclovir (VALTREX) 1000 MG tablet Take 1,000 mg by mouth 3 (three) times daily.     No facility-administered medications prior to visit.     EXAM:  BP 118/88 (BP Location: Left Arm, Patient Position: Sitting, Cuff Size: Normal)   Pulse 81   Temp 98.1 F (36.7 C) (Oral)   Wt 179 lb 3.2 oz (81.3 kg)   SpO2 97%   BMI 31.25 kg/m   Body mass index is 31.25 kg/m.  GENERAL: vitals reviewed and listed above, alert, oriented, appears well hydrated and in no acute distress HEENT: atraumatic, conjunctiva  clear, no obvious abnormalities on inspection of external nose and ears  NECK: no obvious masses on inspection palpation  LUNGS: clear to auscultation bilaterally, no wheezes, rales or rhonchi, good air movement CV: HRRR, no clubbing cyanosis or  peripheral edema nl cap refill  MS: moves all extremities without noticeable focal  abnormality PSYCH: pleasant and cooperative, no obvious depression or anxiety ttired appearing  Lab Results  Component Value Date   WBC 3.8 (L) 11/02/2020   HGB  13.9  11/02/2020   HCT 41.0 11/02/2020   PLT 170.0 11/02/2020   GLUCOSE 111 (H) 03/08/2021   CHOL 143 12/09/2021   TRIG 69.0 12/09/2021   HDL 38.80 (L) 12/09/2021   LDLDIRECT 144.5 10/31/2010   LDLCALC 91 12/09/2021   ALT 13 09/24/2017   AST 14 09/24/2017   NA 141 03/08/2021   K 3.5 03/08/2021   CL 100 03/08/2021   CREATININE 0.74 03/08/2021   BUN 13 03/08/2021   CO2 30 03/08/2021   TSH 4.88 07/18/2021   HGBA1C 8.0 (H) 12/09/2021   BP Readings from Last 3 Encounters:  12/13/21 118/88  07/20/21 124/86  03/30/21 (!) 156/86    ASSESSMENT AND PLAN:  Discussed the following assessment and plan:  Type 2 diabetes mellitus with hyperglycemia, without long-term current use of insulin (HCC)  Medication management  Essential hypertension  Obesity, Class I, BMI 30-34.9  Hyperlipidemia associated with type 2 diabetes mellitus (HCC) - ldl 90 range  still would like to get to 70 try taking crestor weekly   Bereavement Send in glucose meter rx  Disc options begin  farxiga  risk benefit  Plan fu 3 mos after beginning and will fu lab . Disc other options for BG monitoring such as free stlye libre -Patient advised to return or notify health care team  if  new concerns arise.  Patient Instructions  Take the crestor once a week   Add new medication for  diabetes .  Will get you a note that you have diabetes   Plan  3 months FU fu   Check blood glucose  at least in am fasting .  And then another time per day .   Standley Brooking. Madysen Faircloth M.D.

## 2021-12-13 NOTE — Patient Instructions (Signed)
Take the crestor once a week   Add new medication for  diabetes .  Will get you a note that you have diabetes   Plan  3 months FU fu   Check blood glucose  at least in am fasting .  And then another time per day .

## 2021-12-21 ENCOUNTER — Telehealth: Payer: Self-pay | Admitting: Internal Medicine

## 2021-12-21 NOTE — Telephone Encounter (Signed)
Pt called to say that both Medicaid and the pharmacy told her that they are waiting for the prior approval of the:   dapagliflozin propanediol (FARXIGA) 10 MG TABS tablet  [id number] [place] [facility] [address] Phone:  [phone] Fax:  [phone]     Please advise.

## 2021-12-22 NOTE — Telephone Encounter (Signed)
PA sent in with Covermymeds  Key: [id number] - PA Case ID: [id number]

## 2021-12-22 NOTE — Telephone Encounter (Signed)
Approval Timeframe: Start Date 12/22/2021 End Date 12/22/2022   Updated the pharmacy([facility]) with the information.

## 2021-12-23 ENCOUNTER — Telehealth: Payer: Self-pay | Admitting: Internal Medicine

## 2021-12-23 NOTE — Telephone Encounter (Signed)
Pt called to say Pharmacy told her they are still waiting for the PA for  dapagliflozin propanediol (FARXIGA) 10 MG TABS tablet [[id number]]   Last OV:  12/13/21  Pt has not been able to start this medication.  [id number] [place] Phone:  [phone] Fax:  [phone]     Please advise.

## 2021-12-23 NOTE — Telephone Encounter (Signed)
Called patient. Left detail message that medication was approved and contacted the pharmacy. See note. And to call us back if have question

## 2022-01-02 ENCOUNTER — Other Ambulatory Visit: Payer: Self-pay | Admitting: Internal Medicine

## 2022-02-27 ENCOUNTER — Other Ambulatory Visit: Payer: Self-pay | Admitting: Internal Medicine

## 2022-04-29 ENCOUNTER — Other Ambulatory Visit: Payer: Self-pay | Admitting: Internal Medicine

## 2022-06-01 ENCOUNTER — Other Ambulatory Visit: Payer: Self-pay | Admitting: Internal Medicine

## 2022-06-04 ENCOUNTER — Other Ambulatory Visit: Payer: Self-pay | Admitting: Internal Medicine

## 2022-07-17 ENCOUNTER — Other Ambulatory Visit: Payer: Self-pay | Admitting: Internal Medicine

## 2022-07-17 DIAGNOSIS — Z1231 Encounter for screening mammogram for malignant neoplasm of breast: Secondary | ICD-10-CM

## 2022-07-20 ENCOUNTER — Ambulatory Visit
Admission: RE | Admit: 2022-07-20 | Discharge: 2022-07-20 | Disposition: A | Discharge status: Home / Self Care | Payer: Medicaid Other | Source: Ambulatory Visit | Attending: Internal Medicine | Admitting: Internal Medicine

## 2022-07-20 DIAGNOSIS — Z1231 Encounter for screening mammogram for malignant neoplasm of breast: Secondary | ICD-10-CM

## 2022-08-03 ENCOUNTER — Telehealth: Payer: Self-pay | Admitting: Internal Medicine

## 2022-08-03 ENCOUNTER — Other Ambulatory Visit: Payer: Self-pay | Admitting: Internal Medicine

## 2022-08-03 DIAGNOSIS — I10 Essential (primary) hypertension: Secondary | ICD-10-CM

## 2022-08-03 DIAGNOSIS — E1169 Type 2 diabetes mellitus with other specified complication: Secondary | ICD-10-CM

## 2022-08-03 DIAGNOSIS — E1165 Type 2 diabetes mellitus with hyperglycemia: Secondary | ICD-10-CM

## 2022-08-03 DIAGNOSIS — Z79899 Other long term (current) drug therapy: Secondary | ICD-10-CM

## 2022-08-03 DIAGNOSIS — E063 Autoimmune thyroiditis: Secondary | ICD-10-CM

## 2022-08-03 NOTE — Telephone Encounter (Signed)
Pt called to ask MD if she should have her A1C checked before her CPE scheduled for 09/05/22?  Please advise.

## 2022-08-07 ENCOUNTER — Ambulatory Visit (INDEPENDENT_AMBULATORY_CARE_PROVIDER_SITE_OTHER): Payer: Medicaid Other

## 2022-08-07 ENCOUNTER — Ambulatory Visit
Admission: RE | Admit: 2022-08-07 | Discharge: 2022-08-07 | Disposition: A | Discharge status: Home / Self Care | Payer: Medicaid Other | Source: Ambulatory Visit | Attending: Internal Medicine | Admitting: Internal Medicine

## 2022-08-07 VITALS — BP 142/88 | HR 110 | Temp 101.0°F | Resp 24

## 2022-08-07 DIAGNOSIS — R059 Cough, unspecified: Secondary | ICD-10-CM | POA: Diagnosis not present

## 2022-08-07 DIAGNOSIS — J4540 Moderate persistent asthma, uncomplicated: Secondary | ICD-10-CM

## 2022-08-07 DIAGNOSIS — R509 Fever, unspecified: Secondary | ICD-10-CM

## 2022-08-07 DIAGNOSIS — R051 Acute cough: Secondary | ICD-10-CM

## 2022-08-07 MED ORDER — IPRATROPIUM-ALBUTEROL 0.5-2.5 (3) MG/3ML IN SOLN
3.0000 mL | Freq: Once | RESPIRATORY_TRACT | Status: AC
  Administered 2022-08-07: 3 mL via RESPIRATORY_TRACT

## 2022-08-07 MED ORDER — ACETAMINOPHEN 325 MG PO TABS
650.0000 mg | ORAL_TABLET | Freq: Once | ORAL | Status: AC
  Administered 2022-08-07: 650 mg via ORAL

## 2022-08-07 MED ORDER — ALBUTEROL SULFATE HFA 108 (90 BASE) MCG/ACT IN AERS: 2.0000 | INHALATION_SPRAY | Freq: Four times a day (QID) | RESPIRATORY_TRACT | 2 refills | Status: DC | PRN

## 2022-08-07 MED ORDER — PREDNISONE 50 MG PO TABS: ORAL_TABLET | ORAL | 0 refills | Status: DC

## 2022-08-07 MED ORDER — ACETAMINOPHEN 325 MG PO TABS: 650.0000 mg | ORAL_TABLET | Freq: Four times a day (QID) | ORAL | Status: DC | PRN

## 2022-08-07 NOTE — Telephone Encounter (Signed)
Lab orders placed.  Attempted to reach pt. Left a voicemail to call us back.

## 2022-08-07 NOTE — ED Triage Notes (Signed)
Pt c/o cough, bones aches, dyspnea, feeling hot   Onset ~ yesterday

## 2022-08-07 NOTE — Telephone Encounter (Signed)
Please order  lipid panel, Hg A1c , cmp   [place]  microalbumin creatinine ratio   Pre visit  dx   HT thyroidditiis  HLD, DM medication management

## 2022-08-07 NOTE — ED Provider Notes (Signed)
[name]    CSN: [id number] Arrival date & time: 08/07/22  1303      History   Chief Complaint Chief Complaint  Patient presents with   Cough    HPI Monica Estrada is a 57 y.o. female.   Patient complains of a cough and congestion.  Patient reports that she has had a fever today and increasing shortness of breath.  Patient reports that the last time she had shortness of breath like this she was diagnosed with RSV.  She reports she has an albuterol nebulizer at home she does not have medication.  Patient states she has not used it in an extended period of time patient does not have an inhaler.  Patient has had a fever today  The history is provided by the patient.  Cough Cough characteristics:  Non-productive Sputum characteristics:  Nondescript Severity:  Moderate Onset quality:  Gradual Duration:  1 day Timing:  Constant Progression:  Worsening Smoker: no   Relieved by:  Nothing Worsened by:  Nothing Ineffective treatments:  None tried Associated symptoms: no chills and no fever     Past Medical History:  Diagnosis Date   Abnormal Pap smear    Asthma    Colonic edema    mild left- ed eval abd ct   Gave birth to child recently    2012   History of hypothyroidism 1999   of post partum   Hx of abnormal cervical Pap smear    Hypertension    RSV (respiratory syncytial virus infection)    with wheezing and asthmatic sx   Uveitis    stable   Wheezing 04/01/2015    Patient Active Problem List   Diagnosis Date Noted   RSV bronchiolitis 05/19/2020   Wheezing 04/01/2015   Essential hypertension 04/17/2014   TSH elevation 04/17/2014   Medication management 04/17/2014   History of hypokalemia 11/26/2012   Tingling 11/26/2012   BMI 32.0-32.9,adult 99991111   Umbilical hernia  possible from lap site  12/05/2011   Preventative health care 11/12/2010   Uveitis    History of hypothyroidism    NONSPECIFIC ABN FINDING RAD & OTH EXAM GI TRACT  11/25/2009   Anemia 11/03/2009   AUTOIMMUNE THYROIDITIS 11/20/2008   UVEITIS 12/12/2006   Hyperlipemia 12/11/2006    Past Surgical History:  Procedure Laterality Date   CHOLECYSTECTOMY     1999   COLONOSCOPY     TUBAL LIGATION  12/31/2010   Procedure: BILATERAL TUBAL LIGATION;  Surgeon: Blane Ohara Meisinger;  Location: [facility];  Service: Gynecology;  Laterality: Bilateral;  Bilateral post partum tubal ligation    OB History     Gravida  2   Para  2   Term  2   Preterm  0   AB  0   Living  2      SAB  0   IAB  0   Ectopic  0   Multiple  0   Live Births  1            Home Medications    Prior to Admission medications   Medication Sig Start Date End Date Taking? Authorizing Provider  ACCU-CHEK GUIDE test strip TEST UP TO 4 TIMES DAILY AS DIRECTED 08/03/22   Panosh, Standley Brooking, MD  Accu-Chek Softclix Lancets lancets TEST UP TO 4 TIMES DAILY 01/04/22   Panosh, Standley Brooking, MD  acetaZOLAMIDE (DIAMOX) 250 MG tablet Take 500 mg by mouth in the morning and at bedtime.  05/04/20   [provider]  Adalimumab 40 MG/0.4ML PNKT Inject into the skin. 01/18/21   [provider]  albuterol (VENTOLIN HFA) 108 (90 Base) MCG/ACT inhaler Inhale 2 puffs into the lungs every 4 (four) hours as needed for wheezing or shortness of breath. 06/30/19   Scot Jun, NP  amLODipine (NORVASC) 10 MG tablet Take 1 tablet (10 mg total) by mouth daily. SCHEDULE ANNUAL VISIT FOR FUTURE REFILLS 06/02/22   Panosh, Standley Brooking, MD  aspirin 81 MG EC tablet Take by mouth.    [provider]  atropine 1 % ophthalmic solution Place 1 drop into the right eye 2 (two) times daily.    [provider]  azaTHIOprine (IMURAN) 50 MG tablet Take 100 mg by mouth in the morning and at bedtime. 05/04/20   [provider]  blood glucose meter kit and supplies KIT Dispense based on patient and insurance preference. Use up to four times daily as directed. 12/13/21   Panosh, Standley Brooking, MD   brimonidine (ALPHAGAN) 0.2 % ophthalmic solution Place 1 drop into both eyes every 8 (eight) hours. 05/13/20   [provider]  dapagliflozin propanediol (FARXIGA) 10 MG TABS tablet TAKE 1 TABLET BY MOUTH EVERY DAY BEFORE BREAKFAST 08/03/22   Panosh, Standley Brooking, MD  dorzolamide-timolol (COSOPT) 22.3-6.8 MG/ML ophthalmic solution Place 1 drop into both eyes in the morning and at bedtime. 05/01/20   [provider]  ferrous sulfate 325 (65 FE) MG tablet Take 650 mg by mouth daily with breakfast.    [provider]  Fluticasone-Salmeterol (ADVAIR DISKUS) 250-50 MCG/DOSE AEPB Inhale 1 puff into the lungs in the morning and at bedtime. 05/25/20 05/25/21  [place] Donnamarie Poag, DO  folic acid (FOLVITE) 1 MG tablet Take 1 tablet by mouth daily. 11/19/18   [provider]  ibuprofen (ADVIL) 600 MG tablet Take by mouth. 07/02/15   [provider]  ipratropium-albuterol (DUONEB) 0.5-2.5 (3) MG/3ML SOLN Take 3 mLs by nebulization every 4 (four) hours as needed (shortness of breath/wheezing). 05/25/20   [place] Eric J, DO  latanoprost (XALATAN) 0.005 % ophthalmic solution Place 1 drop into both eyes at bedtime. 03/22/20   [provider]  lisinopril-hydrochlorothiazide (ZESTORETIC) 20-25 MG tablet TAKE 1 TABLET BY MOUTH EVERY DAY 06/05/22   Panosh, Standley Brooking, MD  nystatin ointment (MYCOSTATIN) Apply 1 application topically in the morning and at bedtime. 05/12/20   [provider]  prednisoLONE acetate (PRED FORTE) 1 % ophthalmic suspension Place 1 drop into the left eye every 2 (two) hours. When awake. 04/11/20   [provider]  predniSONE (DELTASONE) 20 MG tablet Take 1 tablet (20 mg total) by mouth 2 (two) times daily with a meal. If needed for  wheezing flair 11/17/20   Panosh, Standley Brooking, MD  rosuvastatin (CRESTOR) 10 MG tablet Take 1 tablet (10 mg total) by mouth daily. SCHEDULE ANNUAL VISIT FOR FUTURE REFILLS 06/02/22   Panosh, Standley Brooking, MD  tacrolimus  (PROGRAF) 1 MG capsule Take by mouth. 01/18/21   [provider]  tiotropium (SPIRIVA HANDIHALER) 18 MCG inhalation capsule Place 1 capsule (18 mcg total) into inhaler and inhale daily. 05/25/20 05/25/21  [place] Donnamarie Poag, DO  triamcinolone ointment (KENALOG) 0.1 % Apply 1 application. topically in the morning and at bedtime. 05/12/20   [provider]  valACYclovir (VALTREX) 1000 MG tablet Take 1,000 mg by mouth 3 (three) times daily. 03/11/21   [provider]    Family History Family History  Problem Relation Age of Onset   Hypertension Mother    Diabetes Mother    Thyroid disease Mother    Diabetes Father    Hypertension Father    Lupus Sister    Rheum arthritis Sister    Other Sister        lamb disease   Stroke Sister    Diabetes Brother    Diabetes Son    Colon cancer Neg Hx    Colon polyps Neg Hx    Esophageal cancer Neg Hx    Rectal cancer Neg Hx    Stomach cancer Neg Hx     Social History Social History   Tobacco Use   Smoking status: Never   Smokeless tobacco: Never  Vaping Use   Vaping Use: Never used  Substance Use Topics   Alcohol use: No   Drug use: No     Allergies   Patient has no allergy information on record.   Review of Systems Review of Systems  Constitutional:  Negative for chills and fever.  Respiratory:  Positive for cough.   All other systems reviewed and are negative.    Physical Exam Triage Vital Signs ED Triage Vitals  Enc Vitals Group     BP 08/07/22 1359 (!) 142/88     Pulse Rate 08/07/22 1359 (!) 110     Resp 08/07/22 1359 (!) 24     Temp 08/07/22 1359 (!) 101 F (38.3 C)     Temp Source 08/07/22 1359 Oral     SpO2 08/07/22 1359 91 %     Weight --      Height --      Head Circumference --      Peak Flow --      Pain Score 08/07/22 1400 8     Pain Loc --      Pain Edu? --      Excl. in [place]? --    No data found.  Updated Vital Signs BP (!) 142/88 (BP Location: Left Arm)   Pulse (!) 110    Temp (!) 101 F (38.3 C) (Oral)   Resp (!) 24   SpO2 91%   Visual Acuity Right Eye Distance:   Left Eye Distance:   Bilateral Distance:    Right Eye Near:   Left Eye Near:    Bilateral Near:     Physical Exam Vitals and nursing note reviewed.  Constitutional:      General: She is not in acute distress.    Appearance: She is well-developed.  HENT:     Head: Normocephalic and atraumatic.  Eyes:     Conjunctiva/sclera: Conjunctivae normal.  Cardiovascular:     Rate and Rhythm: Normal rate and regular rhythm.     Heart sounds: No murmur heard. Pulmonary:     Effort: Pulmonary effort is normal. No respiratory distress.     Breath sounds: Normal breath sounds.  Abdominal:     Palpations: Abdomen is soft.     Tenderness: There is no abdominal tenderness.  Skin:    General: Skin is warm and dry.     Capillary Refill: Capillary refill takes less than 2 seconds.  Neurological:     Mental Status: She is alert.  Psychiatric:        Mood and Affect: Mood normal.      UC Treatments / Results  Labs (all labs ordered are listed, but only abnormal results are displayed) Labs Reviewed - No data to display  EKG  Radiology DG Chest 2 View  Result Date: 08/07/2022 CLINICAL DATA:  Fever and cough. EXAM: CHEST - 2 VIEW COMPARISON:  Chest radiograph 05/19/2020 FINDINGS: The heart size and mediastinal contours are within normal limits. Lungs are clear. No pneumothorax or pleural effusion. Thoracic scoliosis. IMPRESSION: No acute cardiopulmonary finding. Electronically Signed   By: Audie Pinto M.D.   On: 08/07/2022 14:30    Procedures Procedures (including critical care time)  Medications Ordered in UC Medications  acetaminophen (TYLENOL) tablet 650 mg (has no administration in time range)  acetaminophen (TYLENOL) tablet 650 mg (650 mg Oral Given 08/07/22 1401)  ipratropium-albuterol (DUONEB) 0.5-2.5 (3) MG/3ML nebulizer solution 3 mL (3 mLs Nebulization Given 08/07/22 1407)     Initial Impression / Assessment and Plan / UC Course  I have reviewed the triage vital signs and the nursing notes.  Pertinent labs & imaging results that were available during my care of the patient were reviewed by me and considered in my medical decision making (see chart for details).     Medical decision making: Patient is given a DuoNeb here chest x-ray shows no evidence of pneumonia.  Patient is given a prescription for albuterol and prednisone.  Patient is advised to follow-up with her physician for recheck patient advised to return if any problems Final Clinical Impressions(s) / UC Diagnoses   Final diagnoses:  Acute cough  Moderate persistent asthma, unspecified whether complicated   Discharge Instructions   None    ED Prescriptions   None    PDMP not reviewed this encounter. An After Visit Summary was printed and given to the patient.       Fransico Meadow, [place] 08/07/22 1450

## 2022-08-09 NOTE — Telephone Encounter (Signed)
Attempted to reach pt. Left a detail message that she can get her blood work before cpe, make sure to schedule a lab appt. To call back if have any question.

## 2022-08-17 ENCOUNTER — Other Ambulatory Visit: Payer: Self-pay | Admitting: Internal Medicine

## 2022-08-25 ENCOUNTER — Other Ambulatory Visit (INDEPENDENT_AMBULATORY_CARE_PROVIDER_SITE_OTHER): Payer: Medicaid Other

## 2022-08-25 DIAGNOSIS — Z79899 Other long term (current) drug therapy: Secondary | ICD-10-CM | POA: Diagnosis not present

## 2022-08-25 DIAGNOSIS — E1165 Type 2 diabetes mellitus with hyperglycemia: Secondary | ICD-10-CM

## 2022-08-25 DIAGNOSIS — E063 Autoimmune thyroiditis: Secondary | ICD-10-CM

## 2022-08-25 DIAGNOSIS — E785 Hyperlipidemia, unspecified: Secondary | ICD-10-CM

## 2022-08-25 DIAGNOSIS — E1169 Type 2 diabetes mellitus with other specified complication: Secondary | ICD-10-CM

## 2022-08-25 DIAGNOSIS — I10 Essential (primary) hypertension: Secondary | ICD-10-CM

## 2022-08-25 LAB — COMPREHENSIVE METABOLIC PANEL
ALT: 25 U/L (ref 0–35)
AST: 26 U/L (ref 0–37)
Albumin: 4.2 g/dL (ref 3.5–5.2)
Alkaline Phosphatase: 63 U/L (ref 39–117)
BUN: 18 mg/dL (ref 6–23)
CO2: 29 mEq/L (ref 19–32)
Calcium: 10 mg/dL (ref 8.4–10.5)
Chloride: 104 mEq/L (ref 96–112)
Creatinine, Ser: 0.77 mg/dL (ref 0.40–1.20)
GFR: 85.5 mL/min (ref >60.00)
Glucose, Bld: 106 mg/dL — ABNORMAL HIGH (ref 70–99)
Potassium: 3.4 mEq/L — ABNORMAL LOW (ref 3.5–5.1)
Sodium: 140 mEq/L (ref 135–145)
Total Bilirubin: 0.6 mg/dL (ref 0.2–1.2)
Total Protein: 7.4 g/dL (ref 6.0–8.3)

## 2022-08-25 LAB — LIPID PANEL
Cholesterol: 135 mg/dL (ref 0–200)
HDL: 46.9 mg/dL (ref >39.00)
LDL Cholesterol: 71 mg/dL (ref 0–99)
NonHDL: 88.06
Total CHOL/HDL Ratio: 3
Triglycerides: 84 mg/dL (ref 0.0–149.0)
VLDL: 16.8 mg/dL (ref 0.0–40.0)

## 2022-08-25 LAB — MICROALBUMIN / CREATININE URINE RATIO
Creatinine,U: 50.9 mg/dL
Microalb Creat Ratio: 1.4 mg/g (ref 0.0–30.0)
Microalb, Ur: <0.7 mg/dL (ref 0.0–1.9)

## 2022-08-25 LAB — HEMOGLOBIN A1C: Hgb A1c MFr Bld: 6.2 % (ref 4.6–6.5)

## 2022-08-27 NOTE — Progress Notes (Signed)
Potassium borderline low  A1c is good now  will review all lab  at upcoming  appt

## 2022-09-04 NOTE — Progress Notes (Unsigned)
No chief complaint on file.   HPI: Patient  Monica Estrada  57 y.o. comes in today for Preventive Health Care visit   Uveitis hx cystoid macular edema BP   Health Maintenance  Topic Date Due   Hepatitis C Screening  Never done   Zoster Vaccines- Shingrix (1 of 2) Never done   PAP SMEAR-Modifier  05/04/2020   DTaP/Tdap/Td (3 - Td or Tdap) 12/31/2020   COVID-19 Vaccine (5 - 2023-24 season) 01/13/2022   INFLUENZA VACCINE  12/14/2022   Diabetic kidney evaluation - eGFR measurement  08/25/2023   Diabetic kidney evaluation - Urine ACR  08/25/2023   MAMMOGRAM  07/19/2024   COLONOSCOPY (Pts 45-27yrs Insurance coverage will need to be confirmed)  03/31/2031   HIV Screening  Completed   HPV VACCINES  Aged Out   Health Maintenance Review LIFESTYLE:  Exercise:   Tobacco/ETS: Alcohol:  Sugar beverages: Sleep: Drug use: no HH of  Work:    ROS:  GEN/ HEENT: No fever, significant weight changes sweats headaches vision problems hearing changes, CV/ PULM; No chest pain shortness of breath cough, syncope,edema  change in exercise tolerance. GI /GU: No adominal pain, vomiting, change in bowel habits. No blood in the stool. No significant GU symptoms. SKIN/HEME: ,no acute skin rashes suspicious lesions or bleeding. No lymphadenopathy, nodules, masses.  NEURO/ PSYCH:  No neurologic signs such as weakness numbness. No depression anxiety. IMM/ Allergy: No unusual infections.  Allergy .   REST of 12 system review negative except as per HPI   Past Medical History:  Diagnosis Date   Abnormal Pap smear    Asthma    Colonic edema    mild left- ed eval abd ct   Gave birth to child recently    2012   History of hypothyroidism 1999   of post partum   Hx of abnormal cervical Pap smear    Hypertension    RSV (respiratory syncytial virus infection)    with wheezing and asthmatic sx   Uveitis    stable   Wheezing 04/01/2015    Past Surgical History:  Procedure Laterality Date    CHOLECYSTECTOMY     1999   COLONOSCOPY     TUBAL LIGATION  12/31/2010   Procedure: BILATERAL TUBAL LIGATION;  Surgeon: Leighton Roach Meisinger;  Location: [facility];  Service: Gynecology;  Laterality: Bilateral;  Bilateral post partum tubal ligation    Family History  Problem Relation Age of Onset   Hypertension Mother    Diabetes Mother    Thyroid disease Mother    Diabetes Father    Hypertension Father    Lupus Sister    Rheum arthritis Sister    Other Sister        lamb disease   Stroke Sister    Diabetes Brother    Diabetes Son    Colon cancer Neg Hx    Colon polyps Neg Hx    Esophageal cancer Neg Hx    Rectal cancer Neg Hx    Stomach cancer Neg Hx     Social History   Socioeconomic History   Marital status: Single    Spouse name: Not on file   Number of children: Not on file   Years of education: Not on file   Highest education level: Not on file  Occupational History   Occupation: retail  Tobacco Use   Smoking status: Never   Smokeless tobacco: Never  Vaping Use   Vaping Use: Never used  Substance  and Sexual Activity   Alcohol use: No   Drug use: No   Sexual activity: Not Currently  Other Topics Concern   Not on file  Social History Narrative   [name]  ABOUT 30 HOURS    Single   HH of 3 son  56 y  And baby girl 2 year  Father out of country comes back ocass. Some financial support .    Father  In [place].   No pets.    Neg ets .    2 jobs  hh of 2  25 yo and older teen son college                Social Determinants of Corporate investment banker Strain: Not on file  Food Insecurity: Not on file  Transportation Needs: Not on file  Physical Activity: Not on file  Stress: Not on file  Social Connections: Not on file    Outpatient Medications Prior to Visit  Medication Sig Dispense Refill   ACCU-CHEK GUIDE test strip TEST UP TO 4 TIMES DAILY AS DIRECTED 100 strip 1   Accu-Chek Softclix Lancets lancets TEST UP TO 4 TIMES DAILY 100 each 1   acetaZOLAMIDE  (DIAMOX) 250 MG tablet Take 500 mg by mouth in the morning and at bedtime.     Adalimumab 40 MG/0.4ML PNKT Inject into the skin.     albuterol (VENTOLIN HFA) 108 (90 Base) MCG/ACT inhaler Inhale 2 puffs into the lungs every 6 (six) hours as needed for wheezing or shortness of breath. 8 g 2   amLODipine (NORVASC) 10 MG tablet Take 1 tablet (10 mg total) by mouth daily. 90 tablet 0   aspirin 81 MG EC tablet Take by mouth.     atropine 1 % ophthalmic solution Place 1 drop into the right eye 2 (two) times daily.     azaTHIOprine (IMURAN) 50 MG tablet Take 100 mg by mouth in the morning and at bedtime.     blood glucose meter kit and supplies KIT Dispense based on patient and insurance preference. Use up to four times daily as directed. 1 each 0   brimonidine (ALPHAGAN) 0.2 % ophthalmic solution Place 1 drop into both eyes every 8 (eight) hours.     dapagliflozin propanediol (FARXIGA) 10 MG TABS tablet TAKE 1 TABLET BY MOUTH EVERY DAY BEFORE BREAKFAST 30 tablet 2   dorzolamide-timolol (COSOPT) 22.3-6.8 MG/ML ophthalmic solution Place 1 drop into both eyes in the morning and at bedtime.     ferrous sulfate 325 (65 FE) MG tablet Take 650 mg by mouth daily with breakfast.     Fluticasone-Salmeterol (ADVAIR DISKUS) 250-50 MCG/DOSE AEPB Inhale 1 puff into the lungs in the morning and at bedtime. 180 each 3   folic acid (FOLVITE) 1 MG tablet Take 1 tablet by mouth daily.     ibuprofen (ADVIL) 600 MG tablet Take by mouth.     ipratropium-albuterol (DUONEB) 0.5-2.5 (3) MG/3ML SOLN Take 3 mLs by nebulization every 4 (four) hours as needed (shortness of breath/wheezing). 540 mL 0   latanoprost (XALATAN) 0.005 % ophthalmic solution Place 1 drop into both eyes at bedtime.     lisinopril-hydrochlorothiazide (ZESTORETIC) 20-25 MG tablet TAKE 1 TABLET BY MOUTH EVERY DAY 90 tablet 0   nystatin ointment (MYCOSTATIN) Apply 1 application topically in the morning and at bedtime.     prednisoLONE acetate (PRED FORTE) 1 %  ophthalmic suspension Place 1 drop into the left eye every 2 (two) hours. When  awake.     predniSONE (DELTASONE) 50 MG tablet One tablet a day 5 tablet 0   rosuvastatin (CRESTOR) 10 MG tablet Take 1 tablet (10 mg total) by mouth daily. 90 tablet 0   tacrolimus (PROGRAF) 1 MG capsule Take by mouth.     tiotropium (SPIRIVA HANDIHALER) 18 MCG inhalation capsule Place 1 capsule (18 mcg total) into inhaler and inhale daily. 90 capsule 3   triamcinolone ointment (KENALOG) 0.1 % Apply 1 application. topically in the morning and at bedtime.     valACYclovir (VALTREX) 1000 MG tablet Take 1,000 mg by mouth 3 (three) times daily.     No facility-administered medications prior to visit.     EXAM:  There were no vitals taken for this visit.  There is no height or weight on file to calculate BMI. Wt Readings from Last 3 Encounters:  12/13/21 179 lb 3.2 oz (81.3 kg)  07/20/21 178 lb 3.2 oz (80.8 kg)  03/30/21 186 lb (84.4 kg)    Physical Exam: Vital signs reviewed [id number] is a well-developed well-nourished alert cooperative    who appearsr stated age in no acute distress.  HEENT: normocephalic atraumatic , Eyes: PERRL EOM's full, conjunctiva clear, Nares: paten,t no deformity discharge or tenderness., Ears: no deformity EAC's clear TMs with normal landmarks. Mouth: clear OP, no lesions, edema.  Moist mucous membranes. Dentition in adequate repair. NECK: supple without masses, thyromegaly or bruits. CHEST/PULM:  Clear to auscultation and percussion breath sounds equal no wheeze , rales or rhonchi. No chest wall deformities or tenderness. Breast: normal by inspection . No dimpling, discharge, masses, tenderness or discharge . CV: PMI is nondisplaced, S1 S2 no gallops, murmurs, rubs. Peripheral pulses are full without delay.No JVD .  ABDOMEN: Bowel sounds normal nontender  No guard or rebound, no hepato splenomegal no CVA tenderness.  Extremtities:  No clubbing cyanosis or edema, no acute joint  swelling or redness no focal atrophy NEURO:  Oriented x3, cranial nerves 3-12 appear to be intact, no obvious focal weakness,gait within normal limits no abnormal reflexes or asymmetrical SKIN: No acute rashes normal turgor, color, no bruising or petechiae. PSYCH: Oriented, good eye contact, no obvious depression anxiety, cognition and judgment appear normal. LN: no cervical axillary inguinal adenopathy  Lab Results  Component Value Date   WBC 3.8 (L) 11/02/2020   HGB 13.9 11/02/2020   HCT 41.0 11/02/2020   PLT 170.0 11/02/2020   GLUCOSE 106 (H) 08/25/2022   CHOL 135 08/25/2022   TRIG 84.0 08/25/2022   HDL 46.90 08/25/2022   LDLDIRECT 144.5 10/31/2010   LDLCALC 71 08/25/2022   ALT 25 08/25/2022   AST 26 08/25/2022   NA 140 08/25/2022   K 3.4 (L) 08/25/2022   CL 104 08/25/2022   CREATININE 0.77 08/25/2022   BUN 18 08/25/2022   CO2 29 08/25/2022   TSH 4.88 07/18/2021   HGBA1C 6.2 08/25/2022   MICROALBUR <0.7 08/25/2022    BP Readings from Last 3 Encounters:  08/07/22 (!) 142/88  12/13/21 118/88  07/20/21 124/86    Lab results reviewed with patient   ASSESSMENT AND PLAN:  Discussed the following assessment and plan:    ICD-10-CM   1. Visit for preventive health examination  Z00.00     2. Essential hypertension  I10     3. AUTOIMMUNE THYROIDITIS  E06.3     4. Uveitis  H20.9     5. Other hyperlipidemia  E78.49     6. History of hypothyroidism  Z86.39  7. Medication management  Z79.899     8. Low serum potassium level  E87.6     9. Type 2 diabetes mellitus with hyperglycemia, without long-term current use of insulin  E11.65      No follow-ups on file.  Patient Care Team: Safaa Stingley, Neta Mends, MD as PCP - Stana Bunting, MD (Obstetrics and Gynecology) Eber Jones, MD as Referring Physician (Ophthalmology) There are no Patient Instructions on file for this visit.  Neta Mends. Jung Ingerson M.D.

## 2022-09-05 ENCOUNTER — Encounter: Payer: Self-pay | Admitting: Internal Medicine

## 2022-09-05 ENCOUNTER — Other Ambulatory Visit: Payer: Self-pay | Admitting: Internal Medicine

## 2022-09-05 ENCOUNTER — Ambulatory Visit (INDEPENDENT_AMBULATORY_CARE_PROVIDER_SITE_OTHER): Payer: Medicaid Other | Admitting: Internal Medicine

## 2022-09-05 VITALS — BP 124/90 | HR 79 | Temp 98.0°F | Ht 62.8 in | Wt 170.2 lb

## 2022-09-05 DIAGNOSIS — E063 Autoimmune thyroiditis: Secondary | ICD-10-CM

## 2022-09-05 DIAGNOSIS — I10 Essential (primary) hypertension: Secondary | ICD-10-CM | POA: Diagnosis not present

## 2022-09-05 DIAGNOSIS — Z8639 Personal history of other endocrine, nutritional and metabolic disease: Secondary | ICD-10-CM

## 2022-09-05 DIAGNOSIS — H209 Unspecified iridocyclitis: Secondary | ICD-10-CM

## 2022-09-05 DIAGNOSIS — E1165 Type 2 diabetes mellitus with hyperglycemia: Secondary | ICD-10-CM

## 2022-09-05 DIAGNOSIS — E876 Hypokalemia: Secondary | ICD-10-CM

## 2022-09-05 DIAGNOSIS — Z79899 Other long term (current) drug therapy: Secondary | ICD-10-CM

## 2022-09-05 DIAGNOSIS — Z0000 Encounter for general adult medical examination without abnormal findings: Secondary | ICD-10-CM

## 2022-09-05 DIAGNOSIS — E7849 Other hyperlipidemia: Secondary | ICD-10-CM

## 2022-09-05 MED ORDER — VALSARTAN-HYDROCHLOROTHIAZIDE 160-25 MG PO TABS: 1.0000 | ORAL_TABLET | Freq: Every day | ORAL | 1 refills | Status: DC

## 2022-09-05 MED ORDER — IPRATROPIUM-ALBUTEROL 0.5-2.5 (3) MG/3ML IN SOLN: 3.0000 mL | RESPIRATORY_TRACT | 0 refills | Status: DC | PRN

## 2022-09-05 NOTE — Patient Instructions (Addendum)
Change BP medication stop lisinopril hctz and change to valsartan/hctz .   Increase potassium rich foods .  Plan lab at [facility] ( [place]) after 3-4 weeks. Do not have to fast .  Then send in BP readings and we can go form there   Will  order the duoneb.  Follow up depending on bp and dosage change or  3-4 months

## 2022-09-06 ENCOUNTER — Encounter: Payer: Self-pay | Admitting: Internal Medicine

## 2022-10-01 ENCOUNTER — Other Ambulatory Visit: Payer: Self-pay | Admitting: Internal Medicine

## 2022-10-27 ENCOUNTER — Other Ambulatory Visit: Payer: Self-pay | Admitting: Internal Medicine

## 2022-11-10 ENCOUNTER — Telehealth: Payer: Self-pay

## 2022-11-10 NOTE — Telephone Encounter (Signed)
Attempted to reach pt to "make sure she is not taking both of " Valsartan-Hydrochlorothiazide Tab 160-25MG , Lisinopril-Hydrochlorothiazide 20-25MG . Per Dr. Fabian Sharp.  Left a voicemail to contact us back.

## 2022-11-14 NOTE — Telephone Encounter (Signed)
Pt stated she is not taking both prescriptions. She stated she is taking Valsartan-hydrochlorothiazide and amlodipine. Spoke to pt and she stated her Bp has been extremely high. Pt claimed that "top number has been between 180-200 and my bottom number is 80-85". Pt stated she is worried being on the new Rx.

## 2022-11-15 ENCOUNTER — Other Ambulatory Visit: Payer: Self-pay | Admitting: Internal Medicine

## 2022-11-15 NOTE — Telephone Encounter (Signed)
I agree too high if confirmed Can send in clonidine 0.1 mg  to take twice a day disp 60  . Get the lab fu that was planned ( order in system ) and  make fu visit  in 2 weeks  on additional meds  or earlier if needed .

## 2022-11-17 MED ORDER — CLONIDINE HCL 0.1 MG PO TABS: 0.1000 mg | ORAL_TABLET | Freq: Two times a day (BID) | ORAL | 0 refills | Status: DC

## 2022-11-17 NOTE — Telephone Encounter (Signed)
Patient notified of update  and verbalized understanding. Pt will call back to schedule appts.

## 2022-11-17 NOTE — Addendum Note (Signed)
Addended by: Waymon Amato R on: 11/17/2022 08:39 AM   Modules accepted: Orders

## 2022-11-20 ENCOUNTER — Other Ambulatory Visit (INDEPENDENT_AMBULATORY_CARE_PROVIDER_SITE_OTHER): Payer: Medicaid Other

## 2022-11-20 DIAGNOSIS — Z79899 Other long term (current) drug therapy: Secondary | ICD-10-CM | POA: Diagnosis not present

## 2022-11-20 DIAGNOSIS — I10 Essential (primary) hypertension: Secondary | ICD-10-CM | POA: Diagnosis not present

## 2022-11-20 DIAGNOSIS — E063 Autoimmune thyroiditis: Secondary | ICD-10-CM | POA: Diagnosis not present

## 2022-11-20 DIAGNOSIS — E1165 Type 2 diabetes mellitus with hyperglycemia: Secondary | ICD-10-CM

## 2022-11-20 DIAGNOSIS — E876 Hypokalemia: Secondary | ICD-10-CM

## 2022-11-20 LAB — CBC WITH DIFFERENTIAL/PLATELET
Basophils Absolute: 0 10*3/uL (ref 0.0–0.1)
Basophils Relative: 0.7 % (ref 0.0–3.0)
Eosinophils Absolute: 0.1 10*3/uL (ref 0.0–0.7)
Eosinophils Relative: 1.7 % (ref 0.0–5.0)
HCT: 41.6 % (ref 36.0–46.0)
Hemoglobin: 13.6 g/dL (ref 12.0–15.0)
Lymphocytes Relative: 44.9 % (ref 12.0–46.0)
Lymphs Abs: 2.4 10*3/uL (ref 0.7–4.0)
MCHC: 32.8 g/dL (ref 30.0–36.0)
MCV: 90 fl (ref 78.0–100.0)
Monocytes Absolute: 0.4 10*3/uL (ref 0.1–1.0)
Monocytes Relative: 6.6 % (ref 3.0–12.0)
Neutro Abs: 2.5 10*3/uL (ref 1.4–7.7)
Neutrophils Relative %: 46.1 % (ref 43.0–77.0)
Platelets: 163 10*3/uL (ref 150.0–400.0)
RBC: 4.62 Mil/uL (ref 3.87–5.11)
RDW: 12.7 % (ref 11.5–15.5)
WBC: 5.4 10*3/uL (ref 4.0–10.5)

## 2022-11-20 LAB — BASIC METABOLIC PANEL
BUN: 18 mg/dL (ref 6–23)
CO2: 30 mEq/L (ref 19–32)
Calcium: 10.3 mg/dL (ref 8.4–10.5)
Chloride: 103 mEq/L (ref 96–112)
Creatinine, Ser: 0.83 mg/dL (ref 0.40–1.20)
GFR: 78.01 mL/min (ref >60.00)
Glucose, Bld: 128 mg/dL — ABNORMAL HIGH (ref 70–99)
Potassium: 3.4 mEq/L — ABNORMAL LOW (ref 3.5–5.1)
Sodium: 140 mEq/L (ref 135–145)

## 2022-11-20 LAB — T4, FREE: Free T4: 0.58 ng/dL — ABNORMAL LOW (ref 0.60–1.60)

## 2022-11-20 LAB — TSH: TSH: 5.56 u[IU]/mL — ABNORMAL HIGH (ref 0.35–5.50)

## 2022-11-20 NOTE — Progress Notes (Signed)
Thyroid off again , blood count normal ,potassium slightly low . Make visit (virtual ok )to discuss  thyroid  consider meds and bp    .

## 2022-11-24 ENCOUNTER — Other Ambulatory Visit: Payer: Self-pay | Admitting: Adult Health

## 2022-12-11 ENCOUNTER — Encounter: Payer: Self-pay | Admitting: Family Medicine

## 2022-12-11 ENCOUNTER — Ambulatory Visit (INDEPENDENT_AMBULATORY_CARE_PROVIDER_SITE_OTHER): Payer: Medicaid Other | Admitting: Family Medicine

## 2022-12-11 VITALS — BP 142/84 | HR 76 | Temp 98.3°F | Ht 62.8 in | Wt 177.3 lb

## 2022-12-11 DIAGNOSIS — R21 Rash and other nonspecific skin eruption: Secondary | ICD-10-CM | POA: Diagnosis not present

## 2022-12-11 MED ORDER — TRIAMCINOLONE ACETONIDE 0.1 % EX CREA: 1.0000 | TOPICAL_CREAM | Freq: Two times a day (BID) | CUTANEOUS | 1 refills | Status: AC

## 2022-12-11 NOTE — Progress Notes (Signed)
Established Patient Office Visit  Subjective   Patient ID: Fontella Deline, female    DOB: 06-05-1965  Age: 57 y.o. MRN: [id number]  Chief Complaint  Patient presents with   Rash    HPI   Mrs Menken is seen with intermittent rash past few weeks involving forearms and legs.  Currently only has 1 small hyperpigmented area of rash dorsum of the right hand.  Her main concern is that she had 2 sisters that passed away of lupus complications.  She denies any arthralgias.  No malar rash.  No new medications other than recently placed on clonidine.  Rash tends to come up and last for several days and then fade away.  She has had some mild associated itching.  Nonscaly.  No vesicles.  No pustules.  No recent fever.  No significant arthralgias.  Past Medical History:  Diagnosis Date   Abnormal Pap smear    Asthma    Colonic edema    mild left- ed eval abd ct   Gave birth to child recently    2012   History of hypothyroidism 1999   of post partum   Hx of abnormal cervical Pap smear    Hypertension    RSV (respiratory syncytial virus infection)    with wheezing and asthmatic sx   Uveitis    stable   Wheezing 04/01/2015   Past Surgical History:  Procedure Laterality Date   CHOLECYSTECTOMY     1999   COLONOSCOPY     TUBAL LIGATION  12/31/2010   Procedure: BILATERAL TUBAL LIGATION;  Surgeon: Leighton Roach Meisinger;  Location: [facility];  Service: Gynecology;  Laterality: Bilateral;  Bilateral post partum tubal ligation    reports that she has never smoked. She has never used smokeless tobacco. She reports that she does not drink alcohol and does not use drugs. family history includes Diabetes in her brother, father, mother, and son; Hypertension in her father and mother; Lupus in her sister; Other in her sister; Rheum arthritis in her sister; Stroke in her sister; Thyroid disease in her mother. Not on File  Review of Systems  Constitutional:  Negative for chills, fever and weight loss.   Respiratory:  Negative for shortness of breath.   Musculoskeletal:  Negative for joint pain.  Skin:  Positive for rash.      Objective:     BP (!) 142/84 (BP Location: Right Arm, Cuff Size: Normal)   Pulse 76   Temp 98.3 F (36.8 C) (Oral)   Ht 5' 2.8" (1.595 m)   Wt 177 lb 4.8 oz (80.4 kg)   SpO2 97%   BMI 31.61 kg/m  BP Readings from Last 3 Encounters:  12/11/22 (!) 142/84  09/05/22 (!) 124/90  08/07/22 (!) 142/88   Wt Readings from Last 3 Encounters:  12/11/22 177 lb 4.8 oz (80.4 kg)  09/05/22 170 lb 3.2 oz (77.2 kg)  12/13/21 179 lb 3.2 oz (81.3 kg)      Physical Exam Vitals reviewed.  Constitutional:      General: She is not in acute distress.    Appearance: Normal appearance. She is not ill-appearing.  Cardiovascular:     Rate and Rhythm: Normal rate and regular rhythm.  Skin:    Comments: Dorsum right hand over the second metacarpal she has somewhat oval-shaped slightly hyperpigmented nonscaly rash which is about 2 x 4 mm.  Neurological:     Mental Status: She is alert.      No results found  for any visits on 12/11/22.    The 10-year ASCVD risk score (Arnett DK, et al., 2019) is: 14.3%    Assessment & Plan:   Nonspecific hyperpigmented rash dorsum right hand.  Nonscaly.  Etiology unclear.  She has had some intermittent similar areas that she states on both legs and both forearms but no other rash noted at this time.  -We suggested trial of triamcinolone 0.1% cream to use twice daily as needed -If she is getting any new lesions consider follow-up for skin biopsy.   Evelena Peat, MD

## 2022-12-11 NOTE — Patient Instructions (Signed)
Try the prescription cream  If you keep getting new areas of skin rash we might need to consider skin biopsy.

## 2023-01-02 ENCOUNTER — Other Ambulatory Visit: Payer: Self-pay | Admitting: Family

## 2023-01-03 ENCOUNTER — Other Ambulatory Visit (HOSPITAL_COMMUNITY): Payer: Self-pay

## 2023-01-03 ENCOUNTER — Telehealth: Payer: Self-pay | Admitting: Pharmacy Technician

## 2023-01-03 NOTE — Telephone Encounter (Signed)
Pharmacy Patient Advocate Encounter  Received notification from [facility] Medicaid that Prior Authorization for Monica Estrada has been APPROVED from 12/20/22 to 01/03/24. Ran test claim, Copay is $4.00. This test claim was processed through [facility]- copay amounts may vary at other pharmacies due to pharmacy/plan contracts, or as the patient moves through the different stages of their insurance plan.   PA #/Case ID/Reference #: [id number]

## 2023-01-03 NOTE — Telephone Encounter (Signed)
Pharmacy Patient Advocate Encounter   Received notification from CoverMyMeds that prior authorization for Farxiga 10MG  tab is required/requested.   Insurance verification completed.   The patient is insured through [facility] [place] .   Per test claim: PA required; PA submitted to [facility] [place] Medicaid via CoverMyMeds Key/confirmation #/EOC [id number] Status is pending

## 2023-01-03 NOTE — Telephone Encounter (Signed)
Attempted to reach pt. Left a voicemail to call us back.  

## 2023-01-03 NOTE — Telephone Encounter (Signed)
Contacted pt's local pharmacy and spoke to [place]. Notify him. He ran it through successfully. No further action is needed.

## 2023-01-03 NOTE — Telephone Encounter (Signed)
Pt is aware on the below info

## 2023-01-04 ENCOUNTER — Other Ambulatory Visit (HOSPITAL_COMMUNITY): Payer: Self-pay

## 2023-01-08 ENCOUNTER — Emergency Department (HOSPITAL_COMMUNITY): Payer: Medicaid Other

## 2023-01-08 ENCOUNTER — Other Ambulatory Visit: Payer: Self-pay

## 2023-01-08 ENCOUNTER — Emergency Department (HOSPITAL_COMMUNITY)
Admission: EM | Admit: 2023-01-08 | Discharge: 2023-01-08 | Disposition: A | Discharge status: Home / Self Care | Payer: Medicaid Other | Source: Home / Self Care | Attending: Emergency Medicine | Admitting: Emergency Medicine

## 2023-01-08 DIAGNOSIS — E119 Type 2 diabetes mellitus without complications: Secondary | ICD-10-CM | POA: Diagnosis not present

## 2023-01-08 DIAGNOSIS — Z7982 Long term (current) use of aspirin: Secondary | ICD-10-CM | POA: Insufficient documentation

## 2023-01-08 DIAGNOSIS — R42 Dizziness and giddiness: Secondary | ICD-10-CM | POA: Diagnosis present

## 2023-01-08 LAB — MAGNESIUM: Magnesium: 2 mg/dL (ref 1.7–2.4)

## 2023-01-08 LAB — CBC
HCT: 42.9 % (ref 36.0–46.0)
Hemoglobin: 14.8 g/dL (ref 12.0–15.0)
MCH: 29.5 pg (ref 26.0–34.0)
MCHC: 34.5 g/dL (ref 30.0–36.0)
MCV: 85.5 fL (ref 80.0–100.0)
Platelets: 187 10*3/uL (ref 150–400)
RBC: 5.02 MIL/uL (ref 3.87–5.11)
RDW: 12 % (ref 11.5–15.5)
WBC: 5.6 10*3/uL (ref 4.0–10.5)
nRBC: 0 % (ref 0.0–0.2)

## 2023-01-08 LAB — URINALYSIS, ROUTINE W REFLEX MICROSCOPIC
Bacteria, UA: NONE SEEN
Bilirubin Urine: NEGATIVE
Glucose, UA: >=500 mg/dL — AB
Hgb urine dipstick: NEGATIVE
Ketones, ur: NEGATIVE mg/dL
Leukocytes,Ua: NEGATIVE
Nitrite: NEGATIVE
Protein, ur: NEGATIVE mg/dL
Specific Gravity, Urine: 1.01 (ref 1.005–1.030)
pH: 7 (ref 5.0–8.0)

## 2023-01-08 LAB — TROPONIN I (HIGH SENSITIVITY): Troponin I (High Sensitivity): 3 ng/L (ref <18)

## 2023-01-08 LAB — BASIC METABOLIC PANEL
Anion gap: 14 (ref 5–15)
BUN: 16 mg/dL (ref 6–20)
CO2: 26 mmol/L (ref 22–32)
Calcium: 10.3 mg/dL (ref 8.9–10.3)
Chloride: 99 mmol/L (ref 98–111)
Creatinine, Ser: 0.82 mg/dL (ref 0.44–1.00)
GFR, Estimated: >60 mL/min (ref >60)
Glucose, Bld: 119 mg/dL — ABNORMAL HIGH (ref 70–99)
Potassium: 3.5 mmol/L (ref 3.5–5.1)
Sodium: 139 mmol/L (ref 135–145)

## 2023-01-08 LAB — CBG MONITORING, ED: Glucose-Capillary: 113 mg/dL — ABNORMAL HIGH (ref 70–99)

## 2023-01-08 MED ORDER — LACTATED RINGERS IV BOLUS
1000.0000 mL | Freq: Once | INTRAVENOUS | Status: AC
  Administered 2023-01-08: 1000 mL via INTRAVENOUS

## 2023-01-08 MED ORDER — IOHEXOL 350 MG/ML SOLN
75.0000 mL | Freq: Once | INTRAVENOUS | Status: AC | PRN
  Administered 2023-01-08: 75 mL via INTRAVENOUS

## 2023-01-08 NOTE — ED Notes (Signed)
Ambulated pt in hall, pt stated she felt dizzy with slight weakness. Pt had slow and unsteady gait

## 2023-01-08 NOTE — ED Provider Notes (Signed)
[facility]MENT AT [facility] Provider Note   CSN: [id number] Arrival date & time: 01/08/23  0054     History  Chief Complaint  Patient presents with   Dizziness    Monica Estrada is a 57 y.o. female.  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  Patient states that she was doing fine during normal activities last night when she had a sudden episode of feeling off balance and lightheaded.  Somewhat like she might pass out.  She had difficulty walking secondary to the balance issues.  She lives with her son who all the EMS and brought her here for further evaluation.  Patient without any other recent illnesses.  She has no history of the same.  She is been eating drinking normally.  No urinary, GI or other neurologic symptoms.  No palpitations, chest pain, Donnell pain, back pain during the episode or directly proceeding episode.  No focal weakness.  She has felt overall fatigued during the episode.  Son states when she walked she was persistently leaning to the left and had to use the wall for support.   Dizziness      Home Medications Prior to Admission medications   Medication Sig Start Date End Date Taking? Authorizing Provider  ACCU-CHEK GUIDE test strip TEST UP TO 4 TIMES DAILY AS DIRECTED 10/28/22   Worthy Rancher B, FNP  Accu-Chek Softclix Lancets lancets TEST UP TO 4 TIMES DAILY 01/04/22   Panosh, Neta Mends, MD  acetaZOLAMIDE (DIAMOX) 250 MG tablet Take 500 mg by mouth in the morning and at bedtime. 05/04/20   [provider]  Adalimumab 40 MG/0.4ML PNKT Inject into the skin. 01/18/21   [provider]  albuterol (VENTOLIN HFA) 108 (90 Base) MCG/ACT inhaler Inhale 2 puffs into the lungs every 6 (six) hours as needed for wheezing or shortness of breath. 08/07/22   Elson Areas,  PA-C  amLODipine (NORVASC) 10 MG tablet TAKE 1 TABLET BY MOUTH EVERY DAY 11/20/22   Panosh, Neta Mends, MD  aspirin 81 MG EC tablet Take by mouth.    [provider]  atropine 1 % ophthalmic solution Place 1 drop into the right eye 2 (two) times daily.    [provider]  azaTHIOprine (IMURAN) 50 MG tablet Take 100 mg by mouth in the morning and at bedtime. 05/04/20   [provider]  blood glucose meter kit and supplies KIT Dispense based on patient and insurance preference. Use up to four times daily as directed. 12/13/21   Panosh, Neta Mends, MD  brimonidine (ALPHAGAN) 0.2 % ophthalmic solution Place 1 drop into both eyes every 8 (eight) hours. 05/13/20   [provider]  cloNIDine (CATAPRES) 0.1 MG tablet TAKE 1 TABLET BY MOUTH 2 TIMES DAILY. 11/24/22   Panosh, Neta Mends, MD  dorzolamide-timolol (COSOPT) 22.3-6.8 MG/ML ophthalmic solution Place 1 drop into both eyes in the morning and at bedtime. 05/01/20   [provider]  FARXIGA 10 MG TABS tablet TAKE 1 TABLET BY MOUTH EVERY DAY BEFORE BREAKFAST 10/28/22   Worthy Rancher B, FNP  ferrous sulfate 325 (65 FE) MG tablet Take 650 mg by mouth daily with breakfast.    [provider]  Fluticasone-Salmeterol (ADVAIR DISKUS) 250-50 MCG/DOSE AEPB Inhale 1 puff into the lungs in the morning and at bedtime.  05/25/20 05/25/21  [place], Alvira Philips, DO  folic acid (FOLVITE) 1 MG tablet Take 1 tablet by mouth daily. 11/19/18   [provider]  ibuprofen (ADVIL) 600 MG tablet Take by mouth. 07/02/15   [provider]  ipratropium-albuterol (DUONEB) 0.5-2.5 (3) MG/3ML SOLN TAKE 3 MLS BY NEBULIZATION EVERY 4 HOURS AS NEEDED (SHORTNESS OF BREATH/WHEEZING). 11/20/22   Panosh, Neta Mends, MD  latanoprost (XALATAN) 0.005 % ophthalmic solution Place 1 drop into both eyes at bedtime. 03/22/20   [provider]  nystatin ointment (MYCOSTATIN) Apply 1 application topically in the morning and at bedtime. 05/12/20    [provider]  predniSONE (DELTASONE) 50 MG tablet One tablet a day 08/07/22   Cheron Schaumann K, PA-C  rosuvastatin (CRESTOR) 10 MG tablet TAKE 1 TABLET BY MOUTH EVERY DAY 11/20/22   Panosh, Neta Mends, MD  tacrolimus (PROGRAF) 1 MG capsule Take by mouth. 01/18/21   [provider]  tiotropium (SPIRIVA HANDIHALER) 18 MCG inhalation capsule Place 1 capsule (18 mcg total) into inhaler and inhale daily. 05/25/20 05/25/21  [place], Alvira Philips, DO  triamcinolone cream (KENALOG) 0.1 % Apply 1 Application topically 2 (two) times daily. 12/11/22   Burchette, Elberta Fortis, MD  triamcinolone ointment (KENALOG) 0.1 % Apply 1 application. topically in the morning and at bedtime. 05/12/20   [provider]  valACYclovir (VALTREX) 1000 MG tablet Take 1,000 mg by mouth 3 (three) times daily. 03/11/21   [provider]  valsartan-hydrochlorothiazide (DIOVAN-HCT) 160-25 MG tablet Take 1 tablet by mouth daily. Change from lsiniopril  hctz 09/05/22   Panosh, Neta Mends, MD      Allergies    Patient has no allergy information on record.    Review of Systems   Review of Systems  Neurological:  Positive for dizziness.    Physical Exam Updated Vital Signs BP (!) 129/59   Pulse 75   Temp 97.8 F (36.6 C)   Resp 13   Ht 5\' 4"  (1.626 m)   Wt 80.7 kg   SpO2 98%   BMI 30.55 kg/m  Physical Exam Vitals and nursing note reviewed.  Constitutional:      Appearance: She is well-developed.  HENT:     Head: Normocephalic and atraumatic.     Mouth/Throat:     Mouth: Mucous membranes are dry.  Eyes:     Pupils: Pupils are equal, round, and reactive to light.  Cardiovascular:     Rate and Rhythm: Normal rate and regular rhythm.  Pulmonary:     Effort: No respiratory distress.     Breath sounds: No stridor.  Abdominal:     General: There is no distension.  Musculoskeletal:        General: No swelling or tenderness. Normal range of motion.     Cervical back: Normal range of motion.  Skin:     General: Skin is warm and dry.  Neurological:     General: No focal deficit present.     Mental Status: She is alert.     Comments: No altered mental status, able to give full seemingly accurate history.  Face is symmetric, EOM's intact, pupils equal and reactive, vision intact, tongue and uvula midline without deviation. Upper and Lower extremity motor 5/5, intact pain perception in distal extremities, 2+ reflexes in biceps, patella and achilles tendons. Able to perform finger to nose normal with both hands.   Psychiatric:        Mood and Affect: Mood normal.  ED Results / Procedures / Treatments   Labs (all labs ordered are listed, but only abnormal results are displayed) Labs Reviewed  BASIC METABOLIC PANEL - Abnormal; Notable for the following components:      Result Value   Glucose, Bld 119 (*)    All other components within normal limits  URINALYSIS, ROUTINE W REFLEX MICROSCOPIC - Abnormal; Notable for the following components:   Color, Urine STRAW (*)    Glucose, UA >=500 (*)    All other components within normal limits  CBG MONITORING, ED - Abnormal; Notable for the following components:   Glucose-Capillary 113 (*)    All other components within normal limits  CBC  MAGNESIUM  TROPONIN I (HIGH SENSITIVITY)    EKG EKG Interpretation Date/Time:  Monday January 08 2023 01:03:22 EDT Ventricular Rate:  73 PR Interval:  198 QRS Duration:  90 QT Interval:  419 QTC Calculation: 462 R Axis:   7  Text Interpretation: Sinus rhythm Low voltage, precordial leads Confirmed by Marily Memos [phone]) on 01/08/2023 2:20:57 AM  Radiology CT ANGIO HEAD NECK W WO CM  Result Date: 01/08/2023 CLINICAL DATA:  TIA, vasculitis suspected EXAM: CT ANGIOGRAPHY HEAD AND NECK WITH AND WITHOUT CONTRAST TECHNIQUE: Multidetector CT imaging of the head and neck was performed using the standard protocol during bolus administration of intravenous contrast. Multiplanar CT image reconstructions and  MIPs were obtained to evaluate the vascular anatomy. Carotid stenosis measurements (when applicable) are obtained utilizing NASCET criteria, using the distal internal carotid diameter as the denominator. RADIATION DOSE REDUCTION: This exam was performed according to the departmental dose-optimization program which includes automated exposure control, adjustment of the mA and/or kV according to patient size and/or use of iterative reconstruction technique. CONTRAST:  75mL OMNIPAQUE IOHEXOL 350 MG/ML SOLN COMPARISON:  No prior CTA available, correlation is made with 03/22/2020 CT head FINDINGS: CT HEAD FINDINGS Brain: No evidence of acute infarct, hemorrhage, mass, mass effect, or midline shift. No hydrocephalus or extra-axial fluid collection. Partial empty sella. Normal craniocervical junction. Vascular: No hyperdense vessel. Skull: Negative for fracture or focal lesion. Sinuses/Orbits: No acute finding. Status post bilateral lens replacements. Other: The mastoid air cells are well aerated. CTA NECK FINDINGS Aortic arch: Standard branching. Imaged portion shows no evidence of aneurysm or dissection. No significant stenosis of the major arch vessel origins. Right carotid system: No evidence of dissection, occlusion, or hemodynamically significant stenosis (greater than 50%). Left carotid system: No evidence of dissection, occlusion, or hemodynamically significant stenosis (greater than 50%). Vertebral arteries: No evidence of dissection, occlusion, or hemodynamically significant stenosis (greater than 50%). Skeleton: No acute osseous abnormality. Degenerative changes in the cervical spine. Other neck: No acute finding. Upper chest: No focal pulmonary opacity or pleural effusion. Review of the MIP images confirms the above findings CTA HEAD FINDINGS Anterior circulation: Both internal carotid arteries are patent to the termini, without significant stenosis. A1 segments patent. Normal anterior communicating artery.  Anterior cerebral arteries are patent to their distal aspects without significant stenosis. No M1 stenosis or occlusion. MCA branches perfused to their distal aspects without significant stenosis. Posterior circulation: Vertebral arteries patent to the vertebrobasilar junction without significant stenosis. Posterior inferior cerebellar arteries patent proximally. Basilar patent to its distal aspect without significant stenosis. Superior cerebellar arteries patent proximally. Patent P1 segments, diminutive on the right. Near fetal origin of the right PCA from the right posterior communicating artery. PCAs perfused to their distal aspects without significant stenosis. The left posterior communicating artery is not visualized. Venous  sinuses: As permitted by contrast timing, patent. Narrowing of the distal transverse sinuses near the transverse sigmoid junction. Minimal opacification of the left transverse sinus is favored to be secondary to it being non dominant. Anatomic variants: Near fetal origin of the right PCA. Review of the MIP images confirms the above findings IMPRESSION: 1. No acute intracranial process. 2. No intracranial large vessel occlusion or significant stenosis. 3. No hemodynamically significant stenosis in the neck. 4. Partial empty sella and narrowing of the distal transverse sinuses near the transverse-sigmoid junction, which are nonspecific findings but can be seen in the setting of idiopathic intracranial hypertension. Electronically Signed   By: Wiliam Ke M.D.   On: 01/08/2023 03:57   DG Chest 2 View  Result Date: 01/08/2023 CLINICAL DATA:  Ataxia and lightheadedness. EXAM: CHEST - 2 VIEW COMPARISON:  PA and lateral 08/07/2022 FINDINGS: The heart size and mediastinal contours are within normal limits. Both lungs are clear. The visualized skeletal structures are unremarkable apart from moderate thoracic dextroscoliosis. There are multiple overlying monitor wires. IMPRESSION: No evidence  of acute chest disease.  Stable exam. Electronically Signed   By: Almira Bar M.D.   On: 01/08/2023 03:21    Procedures Procedures    Medications Ordered in ED Medications  iohexol (OMNIPAQUE) 350 MG/ML injection 75 mL (75 mLs Intravenous Contrast Given 01/08/23 0340)  lactated ringers bolus 1,000 mL (1,000 mLs Intravenous New Bag/Given 01/08/23 0549)    ED Course/ Medical Decision Making/ A&P                                 Medical Decision Making Amount and/or Complexity of Data Reviewed Labs: ordered. Radiology: ordered.  Risk Prescription drug management.   Workup reassuring here but persistently with balance issues. Will get MRI. Had some soft pressures, will give some fluids as well to see if that helps. Will reassess for disposition.   Patient still having some trouble walking although feels a little bit better.  Still with little bit of soft pressures.  Fluids ordered.  MRI ordered just to make sure she does have a posterior stroke causing her balance issues and possible ataxia.  Care transferred pending MRI and reevaluation.  Suspect patient will be able to go home with for MRI spine even if she does still feel little bit weaker little bit dizzy.    Final Clinical Impression(s) / ED Diagnoses Final diagnoses:  None    Rx / DC Orders ED Discharge Orders     None         Camie Hauss, Barbara Cower, MD 01/09/23 [phone]

## 2023-01-08 NOTE — Discharge Instructions (Addendum)
Return for any problem.  ?

## 2023-01-08 NOTE — ED Provider Notes (Signed)
Patient feels improved.  ED workup is without evidence of significant acute abnormality.  Patient desires discharge home.  Importance of close follow-up is stressed.  Strict return precautions given and understood.     Wynetta Fines, MD 01/08/23 [phone]

## 2023-01-08 NOTE — ED Triage Notes (Signed)
Pt BIBEMS w/ c/o dizziness As per pt she may have taken a extra dose of her BP meds by accident

## 2023-01-30 ENCOUNTER — Other Ambulatory Visit: Payer: Self-pay | Admitting: Family

## 2023-02-02 ENCOUNTER — Other Ambulatory Visit: Payer: Self-pay | Admitting: Internal Medicine

## 2023-02-05 ENCOUNTER — Other Ambulatory Visit: Payer: Self-pay

## 2023-02-05 MED ORDER — DAPAGLIFLOZIN PROPANEDIOL 10 MG PO TABS: ORAL_TABLET | ORAL | 2 refills | Status: DC

## 2023-03-21 NOTE — Progress Notes (Signed)
Chief Complaint  Patient presents with   Medical Management of Chronic Issues    HPI: Monica Estrada 57 y.o. come in for Chronic disease management  Under care for panuveitis  last pv 4 24  BP :amlodipine  valsartan hydrochlorothiazide ( change  from lisinoprilhctz) and clonidine 0.1 bid  readings are good out side home and early wonder if med wearing off .   Takes clonidine  in am and then about 11 - 12 hours later evening  readings controlled at med visits nd [facility] but not at home? Feels ok  DM  :farxiga  bg in 100 - 125 range now  HLD   rosuvastatin  Hard time losing weight   gyme 5 d per week and some weights  feels good with exercise and no cp sob   Visit to ed was felt to be from   her taking melatonin  at a incorrect time. ROS: See pertinent positives and negatives per HPI.  Past Medical History:  Diagnosis Date   Abnormal Pap smear    Asthma    Colonic edema    mild left- ed eval abd ct   Gave birth to child recently    2012   History of hypothyroidism 1999   of post partum   Hx of abnormal cervical Pap smear    Hypertension    RSV (respiratory syncytial virus infection)    with wheezing and asthmatic sx   Uveitis    stable   Wheezing 04/01/2015    Family History  Problem Relation Age of Onset   Hypertension Mother    Diabetes Mother    Thyroid disease Mother    Diabetes Father    Hypertension Father    Lupus Sister    Rheum arthritis Sister    Other Sister        lamb disease   Stroke Sister    Diabetes Brother    Diabetes Son    Colon cancer Neg Hx    Colon polyps Neg Hx    Esophageal cancer Neg Hx    Rectal cancer Neg Hx    Stomach cancer Neg Hx     Social History   Socioeconomic History   Marital status: Single    Spouse name: Not on file   Number of children: Not on file   Years of education: Not on file   Highest education level: Not on file  Occupational History   Occupation: retail  Tobacco Use   Smoking status: Never    Smokeless tobacco: Never  Vaping Use   Vaping status: Never Used  Substance and Sexual Activity   Alcohol use: No   Drug use: No   Sexual activity: Not Currently  Other Topics Concern   Not on file  Social History Narrative   [name]  ABOUT 30 HOURS    Single   HH of 3 son  16 y  And baby girl 2 year  Father out of country comes back ocass. Some financial support .    Father  In [place].   No pets.    Neg ets .    2 jobs  hh of 2  28 yo and older teen son college                Social Determinants of Corporate investment banker Strain: Low Risk  (09/05/2022)   Overall Financial Resource Strain (CARDIA)    Difficulty of Paying Living Expenses: Not hard at all  Food Insecurity: No Food Insecurity (09/05/2022)   Hunger Vital Sign    Worried About Running Out of Food in the Last Year: Never true    Ran Out of Food in the Last Year: Never true  Transportation Needs: No Transportation Needs (09/05/2022)   PRAPARE - Administrator, Civil Service (Medical): No    Lack of Transportation (Non-Medical): No  Physical Activity: Sufficiently Active (09/05/2022)   Exercise Vital Sign    Days of Exercise per Week: 5 days    Minutes of Exercise per Session: 60 min  Stress: No Stress Concern Present (09/05/2022)   [employer] of Occupational Health - Occupational Stress Questionnaire    Feeling of Stress : Not at all  Social Connections: Moderately Integrated (09/05/2022)   Social Connection and Isolation Panel [NHANES]    Frequency of Communication with Friends and Family: More than three times a week    Frequency of Social Gatherings with Friends and Family: Once a week    Attends Religious Services: More than 4 times per year    Active Member of [employer] or Organizations: Yes    Attends Engineer, structural: More than 4 times per year    Marital Status: Never married    Outpatient Medications Prior to Visit  Medication Sig Dispense Refill   ACCU-CHEK GUIDE test strip  TEST UP TO 4 TIMES DAILY AS DIRECTED 100 strip 1   Accu-Chek Softclix Lancets lancets TEST UP TO 4 TIMES DAILY 100 each 1   Adalimumab 40 MG/0.4ML PNKT Inject into the skin.     albuterol (VENTOLIN HFA) 108 (90 Base) MCG/ACT inhaler Inhale 2 puffs into the lungs every 6 (six) hours as needed for wheezing or shortness of breath. 8 g 2   amLODipine (NORVASC) 10 MG tablet TAKE 1 TABLET BY MOUTH EVERY DAY 90 tablet 3   aspirin 81 MG EC tablet Take by mouth.     azaTHIOprine (IMURAN) 50 MG tablet Take 100 mg by mouth in the morning and at bedtime.     blood glucose meter kit and supplies KIT Dispense based on patient and insurance preference. Use up to four times daily as directed. 1 each 0   brimonidine (ALPHAGAN) 0.2 % ophthalmic solution Place 1 drop into both eyes every 8 (eight) hours.     cloNIDine (CATAPRES) 0.1 MG tablet TAKE 1 TABLET BY MOUTH 2 TIMES DAILY. 180 tablet 1   dapagliflozin propanediol (FARXIGA) 10 MG TABS tablet TAKE 1 TABLET BY MOUTH EVERY DAY BEFORE BREAKFAST 30 tablet 2   ferrous sulfate 325 (65 FE) MG tablet Take 650 mg by mouth daily with breakfast.     folic acid (FOLVITE) 1 MG tablet Take 1 tablet by mouth daily.     ibuprofen (ADVIL) 600 MG tablet Take by mouth.     ipratropium-albuterol (DUONEB) 0.5-2.5 (3) MG/3ML SOLN TAKE 3 MLS BY NEBULIZATION EVERY 4 HOURS AS NEEDED (SHORTNESS OF BREATH/WHEEZING). 540 mL 3   latanoprost (XALATAN) 0.005 % ophthalmic solution Place 1 drop into both eyes at bedtime.     rosuvastatin (CRESTOR) 10 MG tablet TAKE 1 TABLET BY MOUTH EVERY DAY 90 tablet 3   tacrolimus (PROGRAF) 1 MG capsule Take by mouth.     triamcinolone cream (KENALOG) 0.1 % Apply 1 Application topically 2 (two) times daily. 30 g 1   valACYclovir (VALTREX) 1000 MG tablet Take 1,000 mg by mouth 3 (three) times daily.     valsartan-hydrochlorothiazide (DIOVAN-HCT) 160-25 MG tablet TAKE 1  TABLET BY MOUTH DAILY. CHANGE FROM LSINIOPRIL HCTZ 90 tablet 1   acetaZOLAMIDE (DIAMOX)  250 MG tablet Take 500 mg by mouth in the morning and at bedtime. (Patient not taking: Reported on 03/22/2023)     atropine 1 % ophthalmic solution Place 1 drop into the right eye 2 (two) times daily. (Patient not taking: Reported on 03/22/2023)     dorzolamide-timolol (COSOPT) 22.3-6.8 MG/ML ophthalmic solution Place 1 drop into both eyes in the morning and at bedtime. (Patient not taking: Reported on 03/22/2023)     Fluticasone-Salmeterol (ADVAIR DISKUS) 250-50 MCG/DOSE AEPB Inhale 1 puff into the lungs in the morning and at bedtime. 180 each 3   nystatin ointment (MYCOSTATIN) Apply 1 application topically in the morning and at bedtime. (Patient not taking: Reported on 03/22/2023)     tiotropium (SPIRIVA HANDIHALER) 18 MCG inhalation capsule Place 1 capsule (18 mcg total) into inhaler and inhale daily. 90 capsule 3   predniSONE (DELTASONE) 50 MG tablet One tablet a day (Patient not taking: Reported on 03/22/2023) 5 tablet 0   triamcinolone ointment (KENALOG) 0.1 % Apply 1 application. topically in the morning and at bedtime.     No facility-administered medications prior to visit.     EXAM:  BP 102/80 (BP Location: Left Arm, Patient Position: Sitting, Cuff Size: Large)   Pulse 75   Temp 98.3 F (36.8 C) (Oral)   Ht 5\' 4"  (1.626 m)   Wt 181 lb 3.2 oz (82.2 kg)   SpO2 93%   BMI 31.10 kg/m   Body mass index is 31.1 kg/m. Wt Readings from Last 3 Encounters:  03/22/23 181 lb 3.2 oz (82.2 kg)  01/08/23 178 lb (80.7 kg)  12/11/22 177 lb 4.8 oz (80.4 kg)    GENERAL: vitals reviewed and listed above, alert, oriented, appears well hydrated and in no acute distress HEENT: atraumatic, conjunctiva  clear, no obvious abnormalities on inspection of external nose and ears  NECK: no obvious masses on inspection palpation  LUNGS: clear to auscultation bilaterally, no wheezes, rales or rhonchi, good air movement CV: HRRR, no clubbing cyanosis or  peripheral edema nl cap refill  MS: moves all  extremities without noticeable focal  abnormality PSYCH: pleasant and cooperative, no obvious depression or anxiety Lab Results  Component Value Date   WBC 5.6 01/08/2023   HGB 14.8 01/08/2023   HCT 42.9 01/08/2023   PLT 187 01/08/2023   GLUCOSE 119 (H) 01/08/2023   CHOL 135 08/25/2022   TRIG 84.0 08/25/2022   HDL 46.90 08/25/2022   LDLDIRECT 144.5 10/31/2010   LDLCALC 71 08/25/2022   ALT 25 08/25/2022   AST 26 08/25/2022   NA 139 01/08/2023   K 3.5 01/08/2023   CL 99 01/08/2023   CREATININE 0.82 01/08/2023   BUN 16 01/08/2023   CO2 26 01/08/2023   TSH 5.56 (H) 11/20/2022   HGBA1C 5.7 (A) 03/22/2023   MICROALBUR <0.7 08/25/2022   BP Readings from Last 3 Encounters:  03/22/23 102/80  01/08/23 112/69  12/11/22 (!) 142/84    IMPRESSION: 1. No acute intracranial process. 2. No intracranial large vessel occlusion or significant stenosis. 3. No hemodynamically significant stenosis in the neck. 4. Partial empty sella and narrowing of the distal transverse sinuses near the transverse-sigmoid junction, which are nonspecific findings but can be seen in the setting of idiopathic intracranial hypertension.     Electronically Signed   By: Wiliam Ke M.D.   On: 01/08/2023 03:57     IMPRESSION: 1. No  evidence of an acute intracranial abnormality. 2. Mild multifocal T2 FLAIR hyperintense signal abnormality within the cerebral white matter (with a with subcortical white matter predominance). These signal changes are nonspecific, and differential considerations include chronic small vessel ischemic disease and sequelae of chronic migraine headaches, among others. 3. Partially empty sella turcica. This finding can reflect incidental anatomic variation, or alternatively, it can be associated with idiopathic intracranial hypertension (pseudotumor cerebri).     Electronically Signed   By: Jackey Loge D.O.   On: 01/08/2023 08:45 ASSESSMENT AND PLAN:  Discussed the  following assessment and plan:  Type 2 diabetes mellitus with hyperglycemia, without long-term current use of insulin (HCC) - Plan: POC HgB A1c, TSH, T4, free, Basic metabolic panel  Medication management - Plan: TSH, T4, free, Basic metabolic panel  Thyroiditis, autoimmune - Plan: TSH, T4, free, Basic metabolic panel  Essential hypertension  Obesity, Class I, BMI 30-34.9 Dm much better  diet exercise  and farxiga  Thyroid autoimmune may be a factor in controlling weight thus  prob a candidate for levothyroxine but get lab this week or soon  before beginning. Bp control seems ok after med but may indeed wear off before next dose of  clonidine.  BP readings at home are discrepant  with [facility] and here  so get checked and send in  information. Before adjusting medication   she asks about ozempic   and at this time  since bg is better  would get thyroid  addressed  before consideration .    Expected fu in about 3 mos  -Patient advised to return or notify health care team  if  new concerns arise.  Patient Instructions  A1c is good today ! Attention to the exercise resistance training that you are doing . Get your Bp monitor vetted   to see if  accurate. Before we consider  BP medication changes.  Need lab to include  thyroid  because being off could add to your difficulty losing the weight.   Then plan fu depending on results   If we add Thyroid medication we should check labs in 2-3 months again.  Neta Mends. Stancil Deisher M.D.

## 2023-03-22 ENCOUNTER — Encounter: Payer: Self-pay | Admitting: Internal Medicine

## 2023-03-22 ENCOUNTER — Ambulatory Visit (INDEPENDENT_AMBULATORY_CARE_PROVIDER_SITE_OTHER): Payer: Medicaid Other | Admitting: Internal Medicine

## 2023-03-22 VITALS — BP 102/80 | HR 75 | Temp 98.3°F | Ht 64.0 in | Wt 181.2 lb

## 2023-03-22 DIAGNOSIS — Z79899 Other long term (current) drug therapy: Secondary | ICD-10-CM

## 2023-03-22 DIAGNOSIS — Z7984 Long term (current) use of oral hypoglycemic drugs: Secondary | ICD-10-CM

## 2023-03-22 DIAGNOSIS — I10 Essential (primary) hypertension: Secondary | ICD-10-CM | POA: Diagnosis not present

## 2023-03-22 DIAGNOSIS — E063 Autoimmune thyroiditis: Secondary | ICD-10-CM

## 2023-03-22 DIAGNOSIS — E785 Hyperlipidemia, unspecified: Secondary | ICD-10-CM

## 2023-03-22 DIAGNOSIS — E1165 Type 2 diabetes mellitus with hyperglycemia: Secondary | ICD-10-CM

## 2023-03-22 DIAGNOSIS — E66811 Obesity, class 1: Secondary | ICD-10-CM

## 2023-03-22 LAB — POCT GLYCOSYLATED HEMOGLOBIN (HGB A1C): Hemoglobin A1C: 5.7 % — AB (ref 4.0–5.6)

## 2023-03-22 NOTE — Patient Instructions (Addendum)
A1c is good today ! Attention to the exercise resistance training that you are doing . Get your Bp monitor vetted   to see if  accurate. Before we consider  BP medication changes.  Need lab to include  thyroid  because being off could add to your difficulty losing the weight.   Then plan fu depending on results   If we add Thyroid medication we should check labs in 2-3 months again.

## 2023-03-29 ENCOUNTER — Other Ambulatory Visit: Payer: Self-pay | Admitting: Internal Medicine

## 2023-04-01 ENCOUNTER — Ambulatory Visit: Payer: Medicaid Other

## 2023-04-01 ENCOUNTER — Other Ambulatory Visit: Payer: Self-pay

## 2023-04-01 ENCOUNTER — Ambulatory Visit
Admission: EM | Admit: 2023-04-01 | Discharge: 2023-04-01 | Disposition: A | Discharge status: Home / Self Care | Payer: Medicaid Other | Attending: Internal Medicine | Admitting: Internal Medicine

## 2023-04-01 ENCOUNTER — Encounter: Payer: Self-pay | Admitting: *Deleted

## 2023-04-01 DIAGNOSIS — J189 Pneumonia, unspecified organism: Secondary | ICD-10-CM | POA: Diagnosis not present

## 2023-04-01 DIAGNOSIS — R059 Cough, unspecified: Secondary | ICD-10-CM

## 2023-04-01 DIAGNOSIS — R062 Wheezing: Secondary | ICD-10-CM

## 2023-04-01 DIAGNOSIS — J45901 Unspecified asthma with (acute) exacerbation: Secondary | ICD-10-CM

## 2023-04-01 DIAGNOSIS — R0602 Shortness of breath: Secondary | ICD-10-CM

## 2023-04-01 MED ORDER — DOXYCYCLINE HYCLATE 100 MG PO CAPS: 100.0000 mg | ORAL_CAPSULE | Freq: Two times a day (BID) | ORAL | 0 refills | Status: AC

## 2023-04-01 MED ORDER — AMOXICILLIN-POT CLAVULANATE 875-125 MG PO TABS: 1.0000 | ORAL_TABLET | Freq: Two times a day (BID) | ORAL | 0 refills | Status: DC

## 2023-04-01 MED ORDER — IPRATROPIUM-ALBUTEROL 0.5-2.5 (3) MG/3ML IN SOLN
3.0000 mL | Freq: Once | RESPIRATORY_TRACT | Status: AC
  Administered 2023-04-01: 3 mL via RESPIRATORY_TRACT

## 2023-04-01 MED ORDER — PREDNISONE 20 MG PO TABS: 40.0000 mg | ORAL_TABLET | Freq: Every day | ORAL | 0 refills | Status: AC

## 2023-04-01 MED ORDER — BENZONATATE 100 MG PO CAPS: 100.0000 mg | ORAL_CAPSULE | Freq: Three times a day (TID) | ORAL | 0 refills | Status: DC | PRN

## 2023-04-01 MED ORDER — METHYLPREDNISOLONE ACETATE 80 MG/ML IJ SUSP
80.0000 mg | Freq: Once | INTRAMUSCULAR | Status: AC
  Administered 2023-04-01: 80 mg via INTRAMUSCULAR

## 2023-04-01 NOTE — ED Provider Notes (Signed)
[name]    CSN: [id number] Arrival date & time: 04/01/23  6578      History   Chief Complaint Chief Complaint  Patient presents with   Shortness of Breath    HPI Monica Estrada is a 57 y.o. female.   Patient presents with 2-week history of productive cough.  Reports that she has been having some shortness of breath.  She does have a history of asthma and has been using albuterol inhaler and nebulizer with minimal improvement in symptoms.  She also uses Symbicort daily with no improvement.  Reports she has had some nasal congestion as well.  Her daughter has similar symptoms.  Reports that she has felt feverish but did not take the temperature with thermometer.   Shortness of Breath   Past Medical History:  Diagnosis Date   Abnormal Pap smear    Asthma    Colonic edema    mild left- ed eval abd ct   Gave birth to child recently    2012   History of hypothyroidism 1999   of post partum   Hx of abnormal cervical Pap smear    Hypertension    RSV (respiratory syncytial virus infection)    with wheezing and asthmatic sx   Uveitis    stable   Wheezing 04/01/2015    Patient Active Problem List   Diagnosis Date Noted   RSV bronchiolitis 05/19/2020   Wheezing 04/01/2015   Essential hypertension 04/17/2014   TSH elevation 04/17/2014   Medication management 04/17/2014   History of hypokalemia 11/26/2012   Tingling 11/26/2012   BMI 32.0-32.9,adult 01/08/2012   Umbilical hernia  possible from lap site  12/05/2011   Preventative health care 11/12/2010   Uveitis    History of hypothyroidism    NONSPECIFIC ABN FINDING RAD & OTH EXAM GI TRACT 11/25/2009   Anemia 11/03/2009   AUTOIMMUNE THYROIDITIS 11/20/2008   UVEITIS 12/12/2006   Hyperlipemia 12/11/2006    Past Surgical History:  Procedure Laterality Date   CHOLECYSTECTOMY     1999   COLONOSCOPY     TUBAL LIGATION  12/31/2010   Procedure: BILATERAL TUBAL LIGATION;  Surgeon: Leighton Roach Meisinger;   Location: [facility];  Service: Gynecology;  Laterality: Bilateral;  Bilateral post partum tubal ligation    OB History     Gravida  2   Para  2   Term  2   Preterm  0   AB  0   Living  2      SAB  0   IAB  0   Ectopic  0   Multiple  0   Live Births  1            Home Medications    Prior to Admission medications   Medication Sig Start Date End Date Taking? Authorizing Provider  amoxicillin-clavulanate (AUGMENTIN) 875-125 MG tablet Take 1 tablet by mouth every 12 (twelve) hours. 04/01/23  Yes Annessa Satre, Rolly Salter E, FNP  benzonatate (TESSALON) 100 MG capsule Take 1 capsule (100 mg total) by mouth every 8 (eight) hours as needed for cough. 04/01/23  Yes Lyndzee Kliebert, Rolly Salter E, FNP  doxycycline (VIBRAMYCIN) 100 MG capsule Take 1 capsule (100 mg total) by mouth 2 (two) times daily for 7 days. 04/01/23 04/08/23 Yes Zasha Belleau, Acie Fredrickson, FNP  predniSONE (DELTASONE) 20 MG tablet Take 2 tablets (40 mg total) by mouth daily for 5 days. 04/01/23 04/06/23 Yes Myrl Bynum, Acie Fredrickson, FNP  ACCU-CHEK GUIDE test strip TEST UP TO  4 TIMES DAILY AS DIRECTED 10/28/22   Worthy Rancher B, FNP  Accu-Chek Softclix Lancets lancets TEST UP TO 4 TIMES DAILY 01/04/22   Panosh, Neta Mends, MD  acetaZOLAMIDE (DIAMOX) 250 MG tablet Take 500 mg by mouth in the morning and at bedtime. Patient not taking: Reported on 03/22/2023 05/04/20   [provider]  Adalimumab 40 MG/0.4ML PNKT Inject into the skin. 01/18/21   [provider]  albuterol (VENTOLIN HFA) 108 (90 Base) MCG/ACT inhaler Inhale 2 puffs into the lungs every 6 (six) hours as needed for wheezing or shortness of breath. 08/07/22   Elson Areas, PA-C  amLODipine (NORVASC) 10 MG tablet TAKE 1 TABLET BY MOUTH EVERY DAY 11/20/22   Panosh, Neta Mends, MD  aspirin 81 MG EC tablet Take by mouth.    [provider]  atropine 1 % ophthalmic solution Place 1 drop into the right eye 2 (two) times daily. Patient not taking: Reported on 03/22/2023    [provider]  azaTHIOprine (IMURAN) 50 MG tablet Take 100 mg by mouth in the morning and at bedtime. 05/04/20   [provider]  blood glucose meter kit and supplies KIT Dispense based on patient and insurance preference. Use up to four times daily as directed. 12/13/21   Panosh, Neta Mends, MD  brimonidine (ALPHAGAN) 0.2 % ophthalmic solution Place 1 drop into both eyes every 8 (eight) hours. 05/13/20   [provider]  cloNIDine (CATAPRES) 0.1 MG tablet TAKE 1 TABLET BY MOUTH TWICE A DAY 03/29/23   Worthy Rancher B, FNP  dapagliflozin propanediol (FARXIGA) 10 MG TABS tablet TAKE 1 TABLET BY MOUTH EVERY DAY BEFORE BREAKFAST 02/05/23   Panosh, Neta Mends, MD  dorzolamide-timolol (COSOPT) 22.3-6.8 MG/ML ophthalmic solution Place 1 drop into both eyes in the morning and at bedtime. Patient not taking: Reported on 03/22/2023 05/01/20   [provider]  ferrous sulfate 325 (65 FE) MG tablet Take 650 mg by mouth daily with breakfast.    [provider]  Fluticasone-Salmeterol (ADVAIR DISKUS) 250-50 MCG/DOSE AEPB Inhale 1 puff into the lungs in the morning and at bedtime. 05/25/20 05/25/21  [place], Alvira Philips, DO  folic acid (FOLVITE) 1 MG tablet Take 1 tablet by mouth daily. 11/19/18   [provider]  ibuprofen (ADVIL) 600 MG tablet Take by mouth. 07/02/15   [provider]  ipratropium-albuterol (DUONEB) 0.5-2.5 (3) MG/3ML SOLN TAKE 3 MLS BY NEBULIZATION EVERY 4 HOURS AS NEEDED (SHORTNESS OF BREATH/WHEEZING). 11/20/22   Panosh, Neta Mends, MD  latanoprost (XALATAN) 0.005 % ophthalmic solution Place 1 drop into both eyes at bedtime. 03/22/20   [provider]  nystatin ointment (MYCOSTATIN) Apply 1 application topically in the morning and at bedtime. Patient not taking: Reported on 03/22/2023 05/12/20   [provider]  rosuvastatin (CRESTOR) 10 MG tablet TAKE 1 TABLET BY MOUTH EVERY DAY 11/20/22   Panosh, Neta Mends, MD  tacrolimus (PROGRAF) 1 MG capsule Take  by mouth. 01/18/21   [provider]  tiotropium (SPIRIVA HANDIHALER) 18 MCG inhalation capsule Place 1 capsule (18 mcg total) into inhaler and inhale daily. 05/25/20 05/25/21  [place], Alvira Philips, DO  triamcinolone cream (KENALOG) 0.1 % Apply 1 Application topically 2 (two) times daily. 12/11/22   Burchette, Elberta Fortis, MD  valACYclovir (VALTREX) 1000 MG tablet Take 1,000 mg by mouth 3 (three) times daily. 03/11/21   [provider]  valsartan-hydrochlorothiazide (DIOVAN-HCT) 160-25 MG tablet TAKE 1 TABLET BY MOUTH DAILY. CHANGE FROM [employer]  HCTZ 02/05/23   Eulis Foster, FNP    Family History Family History  Problem Relation Age of Onset   Hypertension Mother    Diabetes Mother    Thyroid disease Mother    Diabetes Father    Hypertension Father    Lupus Sister    Rheum arthritis Sister    Other Sister        lamb disease   Stroke Sister    Diabetes Brother    Diabetes Son    Colon cancer Neg Hx    Colon polyps Neg Hx    Esophageal cancer Neg Hx    Rectal cancer Neg Hx    Stomach cancer Neg Hx     Social History Social History   Tobacco Use   Smoking status: Never   Smokeless tobacco: Never  Vaping Use   Vaping status: Never Used  Substance Use Topics   Alcohol use: No   Drug use: No     Allergies   Patient has no known allergies.   Review of Systems Review of Systems Per HPI  Physical Exam Triage Vital Signs ED Triage Vitals  Encounter Vitals Group     BP 04/01/23 0959 97/66     Systolic BP Percentile --      Diastolic BP Percentile --      Pulse Rate 04/01/23 0959 81     Resp 04/01/23 0959 (!) 28     Temp 04/01/23 0959 98.6 F (37 C)     Temp Source 04/01/23 0959 Oral     SpO2 04/01/23 0959 93 %     Weight --      Height --      Head Circumference --      Peak Flow --      Pain Score 04/01/23 0955 5     Pain Loc --      Pain Education --      Exclude from Growth Chart --    No data found.  Updated Vital Signs BP 97/66 (BP  Location: Left Arm)   Pulse 76   Temp 98.6 F (37 C) (Oral)   Resp (!) 24   SpO2 95%   Visual Acuity Right Eye Distance:   Left Eye Distance:   Bilateral Distance:    Right Eye Near:   Left Eye Near:    Bilateral Near:     Physical Exam Constitutional:      General: She is not in acute distress.    Appearance: Normal appearance. She is not toxic-appearing or diaphoretic.  HENT:     Head: Normocephalic and atraumatic.     Right Ear: Tympanic membrane and ear canal normal.     Left Ear: Tympanic membrane and ear canal normal.     Nose: Congestion present.     Mouth/Throat:     Mouth: Mucous membranes are moist.     Pharynx: No posterior oropharyngeal erythema.  Eyes:     Extraocular Movements: Extraocular movements intact.     Conjunctiva/sclera: Conjunctivae normal.     Pupils: Pupils are equal, round, and reactive to light.  Cardiovascular:     Rate and Rhythm: Normal rate and regular rhythm.     Pulses: Normal pulses.     Heart sounds: Normal heart sounds.  Pulmonary:     Effort: Pulmonary effort is normal. No respiratory distress.     Breath sounds: No stridor. Wheezing present. No rhonchi or rales.     Comments: Mild tachypnea and wheezing  noted on exam.  Abdominal:     General: Abdomen is flat. Bowel sounds are normal.     Palpations: Abdomen is soft.  Musculoskeletal:        General: Normal range of motion.     Cervical back: Normal range of motion.  Skin:    General: Skin is warm and dry.  Neurological:     General: No focal deficit present.     Mental Status: She is alert and oriented to person, place, and time. Mental status is at baseline.  Psychiatric:        Mood and Affect: Mood normal.        Behavior: Behavior normal.      UC Treatments / Results  Labs (all labs ordered are listed, but only abnormal results are displayed) Labs Reviewed - No data to display  EKG   Radiology DG Chest 2 View  Result Date: 04/01/2023 CLINICAL DATA:   Cough and shortness of breath. EXAM: CHEST - 2 VIEW COMPARISON:  Two-view chest x-ray 01/08/2023 FINDINGS: The heart size is normal. Irregular left lower lobe airspace disease is present. The right lung is clear. Lung volumes are low. Rightward curvature of the thoracic spine is stable. Surgical clips are present at the gallbladder fossa. IMPRESSION: Irregular left lower lobe airspace disease concerning for pneumonia. Electronically Signed   By: Marin Roberts M.D.   On: 04/01/2023 12:00    Procedures Procedures (including critical care time)  Medications Ordered in UC Medications  ipratropium-albuterol (DUONEB) 0.5-2.5 (3) MG/3ML nebulizer solution 3 mL (3 mLs Nebulization Given 04/01/23 1030)  methylPREDNISolone acetate (DEPO-MEDROL) injection 80 mg (80 mg Intramuscular Given 04/01/23 1028)  ipratropium-albuterol (DUONEB) 0.5-2.5 (3) MG/3ML nebulizer solution 3 mL (3 mLs Nebulization Given 04/01/23 1115)    Initial Impression / Assessment and Plan / UC Course  I have reviewed the triage vital signs and the nursing notes.  Pertinent labs & imaging results that were available during my care of the patient were reviewed by me and considered in my medical decision making (see chart for details).     Patient presented with wheezing, shortness of breath, and oxygen ranging in the high 80s to low 90s.  Patient was given 2 DuoNeb treatments with IM Depo-Medrol.  Patient's breathing and wheezing significantly improved with patient's oxygen sustaining in the high 90s.  Chest x-ray concerning for left lower lobe pneumonia as well.  Will treat with Augmentin and doxycycline.  Patient also prescribed prednisone to start taking tomorrow given she received IM steroid today in urgent care given concern that patient is having asthma exacerbation as well.  Patient's respiratory status and oxygen improved prior to discharge and sustained so I do think outpatient treatment is reasonable and no need for  emergent evaluation.  Patient is safe for discharge. Although, patient was given strict ER precautions if symptoms persist or worsen.  She does report that she has a pulse oximeter at home as well.  Patient verbalized understanding and was agreeable with plan. Final Clinical Impressions(s) / UC Diagnoses   Final diagnoses:  Moderate asthma with acute exacerbation, unspecified whether persistent  Shortness of breath  Wheezing  Community acquired pneumonia of left lower lobe of lung     Discharge Instructions      Will call if x-ray is abnormal.  I have prescribed an antibiotic, steroid, cough medication.  Start steroid tomorrow.  Follow-up if symptoms persist or worsen.    ED Prescriptions     Medication Sig Dispense Auth.  Provider   amoxicillin-clavulanate (AUGMENTIN) 875-125 MG tablet Take 1 tablet by mouth every 12 (twelve) hours. 14 tablet [place], [place], [place]   predniSONE (DELTASONE) 20 MG tablet Take 2 tablets (40 mg total) by mouth daily for 5 days. 10 tablet [place], Ali Molina E, [place]   benzonatate (TESSALON) 100 MG capsule Take 1 capsule (100 mg total) by mouth every 8 (eight) hours as needed for cough. 21 capsule [place], [place] E, [place]   doxycycline (VIBRAMYCIN) 100 MG capsule Take 1 capsule (100 mg total) by mouth 2 (two) times daily for 7 days. 14 capsule [place], Acie Fredrickson, [place]      PDMP not reviewed this encounter.   Gustavus Bryant, [place] 04/01/23 [id number]

## 2023-04-01 NOTE — Discharge Instructions (Signed)
Will call if x-ray is abnormal.  I have prescribed an antibiotic, steroid, cough medication.  Start steroid tomorrow.  Follow-up if symptoms persist or worsen.

## 2023-04-01 NOTE — ED Triage Notes (Signed)
Productive cough and SOB x 2 weeks. States she has been using her nebulizer machine at home (twice today) with some relief. SHOB with exertion. Temp 101 two days ago

## 2023-04-17 ENCOUNTER — Other Ambulatory Visit: Payer: Self-pay | Admitting: Internal Medicine

## 2023-04-20 ENCOUNTER — Other Ambulatory Visit (INDEPENDENT_AMBULATORY_CARE_PROVIDER_SITE_OTHER): Payer: Medicaid Other

## 2023-04-20 DIAGNOSIS — E063 Autoimmune thyroiditis: Secondary | ICD-10-CM | POA: Diagnosis not present

## 2023-04-20 DIAGNOSIS — E1165 Type 2 diabetes mellitus with hyperglycemia: Secondary | ICD-10-CM

## 2023-04-20 DIAGNOSIS — Z79899 Other long term (current) drug therapy: Secondary | ICD-10-CM

## 2023-04-20 LAB — BASIC METABOLIC PANEL
BUN: 17 mg/dL (ref 6–23)
CO2: 34 meq/L — ABNORMAL HIGH (ref 19–32)
Calcium: 10 mg/dL (ref 8.4–10.5)
Chloride: 101 meq/L (ref 96–112)
Creatinine, Ser: 0.8 mg/dL (ref 0.40–1.20)
GFR: 81.29 mL/min (ref >60.00)
Glucose, Bld: 96 mg/dL (ref 70–99)
Potassium: 3.5 meq/L (ref 3.5–5.1)
Sodium: 141 meq/L (ref 135–145)

## 2023-04-20 LAB — T4, FREE: Free T4: 0.63 ng/dL (ref 0.60–1.60)

## 2023-04-20 LAB — TSH: TSH: 4.36 u[IU]/mL (ref 0.35–5.50)

## 2023-04-26 ENCOUNTER — Encounter: Payer: Self-pay | Admitting: Internal Medicine

## 2023-04-29 NOTE — Progress Notes (Signed)
Thyroid is now in range   chemistry ok  the co2 is not clinically significant as this is a calculated value and since rest of chemistry is normal not diagnostic  We need to get a follow up on your Blood pressures  Can you make an appt in January  and bring in your BP monitor to the visit and wecna go from there.

## 2023-05-08 NOTE — Telephone Encounter (Signed)
I answered this in result note but make sure she can see response.

## 2023-05-10 NOTE — Telephone Encounter (Signed)
Already went over with pt. See lab result note.

## 2023-05-22 NOTE — Progress Notes (Signed)
 Chief Complaint  Patient presents with   Medical Management of Chronic Issues    Follow up on BP. Pt brought home BP monitor for compare.     HPI: Monica Estrada 58 y.o. come in for Chronic disease management  Fu elevaetd BP brings in monitor  Underlying uveitis  Had CAPD 11  24 Last  visit 11 24  plan  Going to gym but hard to lose weight  no gain  Blood pressure  ok after taking med but has noted very high level in an before takes med 200+ range systolic  feels ok with his   bp goes down after med takes clonidine  early am 6:30 and 8 pm  otherwise valsartan  hctz and  amlodipine  .  Last note: A1c is good today ! Attention to the exercise resistance training that you are doing . Get your Bp monitor vetted   to see if  accurate. Before we consider  BP medication changes.   Need lab to include  thyroid   because being off could add to your difficulty losing the weight.    Then plan fu depending on results   If we add Thyroid  medication we should check labs in 2-3 months again.  Monica Estrada K. Monica Estrada M.D. ROS: See pertinent positives and negatives per HPI.  Past Medical History:  Diagnosis Date   Abnormal Pap smear    Asthma    Colonic edema    mild left- ed eval abd ct   Gave birth to child recently    2012   History of hypothyroidism 1999   of post partum   Hx of abnormal cervical Pap smear    Hypertension    RSV (respiratory syncytial virus infection)    with wheezing and asthmatic sx   Uveitis    stable   Wheezing 04/01/2015    Family History  Problem Relation Age of Onset   Hypertension Mother    Diabetes Mother    Thyroid  disease Mother    Diabetes Father    Hypertension Father    Lupus Sister    Rheum arthritis Sister    Other Sister        lamb disease   Stroke Sister    Diabetes Brother    Diabetes Son    Colon cancer Neg Hx    Colon polyps Neg Hx    Esophageal cancer Neg Hx    Rectal cancer Neg Hx    Stomach cancer Neg Hx     Social History    Socioeconomic History   Marital status: Single    Spouse name: Not on file   Number of children: Not on file   Years of education: Not on file   Highest education level: Not on file  Occupational History   Occupation: retail  Tobacco Use   Smoking status: Never   Smokeless tobacco: Never  Vaping Use   Vaping status: Never Used  Substance and Sexual Activity   Alcohol use: No   Drug use: No   Sexual activity: Not Currently  Other Topics Concern   Not on file  Social History Narrative   [name]  ABOUT 30 HOURS    Single   HH of 3 son  53 y  And baby girl 2 year  Father out of country comes back ocass. Some financial support .    Father  [place].   No pets.    Neg ets .    2 jobs  hh of 2  52 yo and older teen son college                Social Drivers of Corporate Investment Banker Strain: Low Risk  (09/05/2022)   Overall Financial Resource Strain (CARDIA)    Difficulty of Paying Living Expenses: Not hard at all  Food Insecurity: No Food Insecurity (09/05/2022)   Hunger Vital Sign    Worried About Running Out of Food in the Last Year: Never true    Ran Out of Food in the Last Year: Never true  Transportation Needs: No Transportation Needs (09/05/2022)   PRAPARE - Administrator, Civil Service (Medical): No    Lack of Transportation (Non-Medical): No  Physical Activity: Sufficiently Active (09/05/2022)   Exercise Vital Sign    Days of Exercise per Week: 5 days    Minutes of Exercise per Session: 60 min  Stress: No Stress Concern Present (09/05/2022)   [employer] of Occupational Health - Occupational Stress Questionnaire    Feeling of Stress : Not at all  Social Connections: Moderately Integrated (09/05/2022)   Social Connection and Isolation Panel [NHANES]    Frequency of Communication with Friends and Family: More than three times a week    Frequency of Social Gatherings with Friends and Family: Once a week    Attends Religious Services: More than 4  times per year    Active Member of [employer] or Organizations: Yes    Attends Engineer, Structural: More than 4 times per year    Marital Status: Never married    Outpatient Medications Prior to Visit  Medication Sig Dispense Refill   ACCU-CHEK GUIDE test strip TEST UP TO 4 TIMES DAILY AS DIRECTED 100 strip 1   Accu-Chek Softclix Lancets lancets TEST UP TO 4 TIMES DAILY 100 each 1   Adalimumab 40 MG/0.4ML PNKT Inject into the skin.     albuterol  (VENTOLIN  HFA) 108 (90 Base) MCG/ACT inhaler Inhale 2 puffs into the lungs every 6 (six) hours as needed for wheezing or shortness of breath. 8 g 2   amLODipine  (NORVASC ) 10 MG tablet TAKE 1 TABLET BY MOUTH EVERY DAY 90 tablet 3   aspirin 81 MG EC tablet Take by mouth.     azaTHIOprine  (IMURAN ) 50 MG tablet Take 100 mg by mouth in the morning and at bedtime.     benzonatate  (TESSALON ) 100 MG capsule Take 1 capsule (100 mg total) by mouth every 8 (eight) hours as needed for cough. 21 capsule 0   blood glucose meter kit and supplies KIT Dispense based on patient and insurance preference. Use up to four times daily as directed. 1 each 0   brimonidine  (ALPHAGAN ) 0.2 % ophthalmic solution Place 1 drop into both eyes every 8 (eight) hours.     FARXIGA  10 MG TABS tablet TAKE 1 TABLET BY MOUTH EVERY DAY BEFORE BREAKFAST 30 tablet 2   ferrous sulfate 325 (65 FE) MG tablet Take 650 mg by mouth daily with breakfast.     folic acid (FOLVITE) 1 MG tablet Take 1 tablet by mouth daily.     ibuprofen  (ADVIL ) 600 MG tablet Take by mouth.     ipratropium-albuterol  (DUONEB) 0.5-2.5 (3) MG/3ML SOLN TAKE 3 MLS BY NEBULIZATION EVERY 4 HOURS AS NEEDED (SHORTNESS OF BREATH/WHEEZING). 540 mL 3   latanoprost  (XALATAN ) 0.005 % ophthalmic solution Place 1 drop into both eyes at bedtime.     rosuvastatin  (CRESTOR ) 10 MG tablet TAKE 1 TABLET BY MOUTH EVERY  DAY 90 tablet 3   tacrolimus (PROGRAF) 1 MG capsule Take by mouth.     triamcinolone  cream (KENALOG ) 0.1 % Apply 1  Application topically 2 (two) times daily. 30 g 1   valACYclovir (VALTREX) 1000 MG tablet Take 1,000 mg by mouth 3 (three) times daily.     valsartan -hydrochlorothiazide  (DIOVAN -HCT) 160-25 MG tablet TAKE 1 TABLET BY MOUTH DAILY. CHANGE FROM LSINIOPRIL HCTZ 90 tablet 1   amoxicillin -clavulanate (AUGMENTIN ) 875-125 MG tablet Take 1 tablet by mouth every 12 (twelve) hours. 14 tablet 0   cloNIDine  (CATAPRES ) 0.1 MG tablet TAKE 1 TABLET BY MOUTH TWICE A DAY 180 tablet 1   acetaZOLAMIDE  (DIAMOX ) 250 MG tablet Take 500 mg by mouth in the morning and at bedtime. (Patient not taking: Reported on 03/22/2023)     atropine  1 % ophthalmic solution Place 1 drop into the right eye 2 (two) times daily. (Patient not taking: Reported on 03/22/2023)     dorzolamide -timolol  (COSOPT ) 22.3-6.8 MG/ML ophthalmic solution Place 1 drop into both eyes in the morning and at bedtime. (Patient not taking: Reported on 05/23/2023)     Fluticasone -Salmeterol (ADVAIR DISKUS) 250-50 MCG/DOSE AEPB Inhale 1 puff into the lungs in the morning and at bedtime. 180 each 3   nystatin ointment (MYCOSTATIN) Apply 1 application topically in the morning and at bedtime. (Patient not taking: Reported on 03/22/2023)     tiotropium (SPIRIVA  HANDIHALER) 18 MCG inhalation capsule Place 1 capsule (18 mcg total) into inhaler and inhale daily. 90 capsule 3   No facility-administered medications prior to visit.     EXAM:  BP 132/70 (BP Location: Right Arm, Patient Position: Sitting, Cuff Size: Normal)   Pulse 79   Temp 98.1 F (36.7 C) (Oral)   Ht 5' 4 (1.626 m)   Wt 185 lb 6.4 oz (84.1 kg)   SpO2 97%   BMI 31.82 kg/m   Body mass index is 31.82 kg/m. BP Readings from Last 3 Encounters:  05/23/23 132/70  04/01/23 97/66  03/22/23 102/80    GENERAL: vitals reviewed and listed above, alert, oriented, appears well hydrated and in no acute distress HEENT: atraumatic, conjunctiva  clear, no obvious abnormalities on inspection of external nose  and ears  NECK: no obvious masses on inspection palpation  LUNGS: clear to auscultation bilaterally, no wheezes, rales or rhonchi, good air movement CV: HRRR, no clubbing cyanosis or  peripheral edema nl cap refill  MS: moves all extremities without noticeable focal  abnormality PSYCH: pleasant and cooperative, no obvious depression or anxiety Lab Results  Component Value Date   WBC 5.6 01/08/2023   HGB 14.8 01/08/2023   HCT 42.9 01/08/2023   PLT 187 01/08/2023   GLUCOSE 96 04/20/2023   CHOL 135 08/25/2022   TRIG 84.0 08/25/2022   HDL 46.90 08/25/2022   LDLDIRECT 144.5 10/31/2010   LDLCALC 71 08/25/2022   ALT 25 08/25/2022   AST 26 08/25/2022   NA 141 04/20/2023   K 3.5 04/20/2023   CL 101 04/20/2023   CREATININE 0.80 04/20/2023   BUN 17 04/20/2023   CO2 34 (H) 04/20/2023   TSH 4.36 04/20/2023   HGBA1C 5.7 (A) 03/22/2023   MICROALBUR <0.7 08/25/2022   BP Readings from Last 3 Encounters:  05/23/23 132/70  04/01/23 97/66  03/22/23 102/80    ASSESSMENT AND PLAN:  Discussed the following assessment and plan:  Essential hypertension  Medication management  Type 2 diabetes mellitus with hyperglycemia, without long-term current use of insulin  (HCC)  Thyroiditis, autoimmune Suspecting  that the clonidine  is helping bring bp down but then not lasting and causing rebound?  Her am readings are in the 200 range and then come down after medication to 1\high 130s 140 range .  Stop the clonidine  and change to carvedilol  6.25 bid and then 12.5 bid as needed.  Plan fu lab an then visit in 3 months   Consider glp1 or other  at fu as she  is interested if can help with weight loss also   Consider  HT clinc as indicated  she is on  4 meds for ht  No evidence of osa Monitor good enough to use as  management .  Follow thyroid      -Patient advised to return or notify health care team  if  new concerns arise.  Patient Instructions  Stop the clonidine   Change to carvedilol  as  planned . I think the clonidine  is wearing off too soon.  Make lab appt  and fu appt in about 3  months .  Can discuss glp 1 meds at fu visit .     Tami Blass K. Kamara Allan M.D.

## 2023-05-23 ENCOUNTER — Other Ambulatory Visit: Payer: Self-pay | Admitting: Internal Medicine

## 2023-05-23 ENCOUNTER — Ambulatory Visit (INDEPENDENT_AMBULATORY_CARE_PROVIDER_SITE_OTHER): Payer: Medicaid Other | Admitting: Internal Medicine

## 2023-05-23 ENCOUNTER — Encounter: Payer: Self-pay | Admitting: Internal Medicine

## 2023-05-23 VITALS — BP 132/70 | HR 79 | Temp 98.1°F | Ht 64.0 in | Wt 185.4 lb

## 2023-05-23 DIAGNOSIS — E063 Autoimmune thyroiditis: Secondary | ICD-10-CM | POA: Diagnosis not present

## 2023-05-23 DIAGNOSIS — Z79899 Other long term (current) drug therapy: Secondary | ICD-10-CM | POA: Diagnosis not present

## 2023-05-23 DIAGNOSIS — I10 Essential (primary) hypertension: Secondary | ICD-10-CM

## 2023-05-23 DIAGNOSIS — E1169 Type 2 diabetes mellitus with other specified complication: Secondary | ICD-10-CM

## 2023-05-23 DIAGNOSIS — E1165 Type 2 diabetes mellitus with hyperglycemia: Secondary | ICD-10-CM | POA: Diagnosis not present

## 2023-05-23 MED ORDER — CARVEDILOL 12.5 MG PO TABS: 6.2500 mg | ORAL_TABLET | Freq: Two times a day (BID) | ORAL | 3 refills | Status: DC

## 2023-05-23 NOTE — Patient Instructions (Signed)
 Stop the clonidine  Change to carvedilol as planned . I think the clonidine is wearing off too soon.  Make lab appt  and fu appt in about 3  months .  Can discuss glp 1 meds at fu visit .

## 2023-05-23 NOTE — Progress Notes (Signed)
 Future orders  for 3 mos fu

## 2023-06-26 ENCOUNTER — Ambulatory Visit
Admission: EM | Admit: 2023-06-26 | Discharge: 2023-06-26 | Disposition: A | Discharge status: Home / Self Care | Payer: Medicaid Other

## 2023-06-26 DIAGNOSIS — R519 Headache, unspecified: Secondary | ICD-10-CM | POA: Diagnosis not present

## 2023-06-26 LAB — POC COVID19/FLU A&B COMBO
Covid Antigen, POC: NEGATIVE
Influenza A Antigen, POC: NEGATIVE
Influenza B Antigen, POC: NEGATIVE

## 2023-06-26 MED ORDER — KETOROLAC TROMETHAMINE 30 MG/ML IJ SOLN
30.0000 mg | Freq: Once | INTRAMUSCULAR | Status: AC
  Administered 2023-06-26: 30 mg via INTRAMUSCULAR

## 2023-06-26 NOTE — ED Notes (Signed)
Verbal Order (Note): Provider unable to access a computer creating unreasonable inconvenience to the ordering provider with possible delay in patient care. Acknowledged by Arlys John & Provider. Repeated Verbal order by Arlys John Provider.

## 2023-06-26 NOTE — ED Triage Notes (Signed)
"  This ha started on Friday, I don't have a history of ha's or migraines but have had to take Motrin/Tylenol non stop for this". "Pain seems to be just above temporal area's". No runny nose. No cough. No new/unexplained rash. Last Glucose "due to pre-diabetes" on 04-20-2023 (94).

## 2023-06-26 NOTE — Discharge Instructions (Signed)
Please report to ED if headache persists after injection or worsens.

## 2023-07-03 NOTE — ED Provider Notes (Signed)
 [name]    CSN: [id number] Arrival date & time: 06/26/23  1454      History   Chief Complaint Chief Complaint  Patient presents with   Headache    HPI Monica Estrada is a 58 y.o. female.   Patient presents today for evaluation of headaches that started a few days ago and seems to be persisting. She denies history of same. She reports pain is present just above temporal area. She has not had any fever, runny nose, or cough. She denies any vomiting.   The history is provided by the patient.  Headache Associated symptoms: no abdominal pain, no congestion, no cough, no diarrhea, no dizziness, no ear pain, no fever, no nausea, no numbness, no sinus pressure, no sore throat and no vomiting     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   Hypertensive retinopathy of both eyes 12/07/2015   Nuclear sclerotic cataract of both eyes 09/28/2015   Posterior synechiae (iris), 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   Wheezing 04/01/2015   Essential hypertension 04/17/2014   TSH elevation 04/17/2014   Medication management 04/17/2014   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   Cystoid macular edema, right 01/31/2013   Uveitis, intermediate, right 01/31/2013   History of hypokalemia 11/26/2012   Tingling 11/26/2012   BMI 32.0-32.9,adult 01/08/2012   Umbilical hernia  possible from lap site  12/05/2011   Preventative health  care 11/12/2010   Uveitis    History of hypothyroidism    NONSPECIFIC ABN FINDING RAD & OTH EXAM GI TRACT 11/25/2009   Anemia 11/03/2009   AUTOIMMUNE THYROIDITIS 11/20/2008   UVEITIS 12/12/2006   Hyperlipemia 12/11/2006    Past Surgical History:  Procedure Laterality Date   CHOLECYSTECTOMY     1999   COLONOSCOPY     TUBAL LIGATION  12/31/2010   Procedure: BILATERAL TUBAL LIGATION;  Surgeon: Leighton Roach Meisinger;  Location: [facility];  Service: Gynecology;  Laterality: Bilateral;  Bilateral post partum tubal ligation    OB History     Gravida  2   Para  2   Term  2   Preterm  0   AB  0   Living  2      SAB  0   IAB  0   Ectopic  0   Multiple  0   Live Births  1            Home Medications    Prior to Admission medications   Medication Sig Start Date End Date Taking? Authorizing Provider  acetaminophen (TYLENOL) 325 MG suppository Place 325 mg rectally every 4 (four) hours as needed.   Yes [provider]  ACCU-CHEK GUIDE test strip TEST UP TO 4 TIMES DAILY AS DIRECTED 10/28/22   Eulis Foster,  FNP  Accu-Chek Softclix Lancets lancets TEST UP TO 4 TIMES DAILY 01/04/22   Panosh, Neta Mends, MD  acetaZOLAMIDE (DIAMOX) 250 MG tablet Take 500 mg by mouth in the morning and at bedtime. Patient not taking: Reported on 03/22/2023 05/04/20   [provider]  adalimumab (HUMIRA, 2 PEN,) 40 MG/0.4ML pen Inject 0.4 mLs into the skin as directed. 07/11/22 05/21/24  [provider]  Adalimumab 40 MG/0.4ML PNKT Inject into the skin. 01/18/21   [provider]  albuterol (VENTOLIN HFA) 108 (90 Base) MCG/ACT inhaler Inhale 2 puffs into the lungs every 6 (six) hours as needed for wheezing or shortness of breath. 08/07/22   Elson Areas, PA-C  albuterol (VENTOLIN HFA) 108 (90 Base) MCG/ACT inhaler Inhale 2 puffs into the lungs every 4 (four) hours as needed for wheezing or shortness of breath. 04/01/15   [provider]  amLODipine (NORVASC)  10 MG tablet TAKE 1 TABLET BY MOUTH EVERY DAY 11/20/22   Panosh, Neta Mends, MD  amLODipine (NORVASC) 10 MG tablet Take 10 mg by mouth daily. 12/31/12   [provider]  aspirin 81 MG EC tablet Take by mouth.    [provider]  aspirin EC 81 MG tablet Take 81 mg by mouth daily. 08/12/20   [provider]  atropine 1 % ophthalmic solution Place 1 drop into the right eye 2 (two) times daily. Patient not taking: Reported on 03/22/2023    [provider]  azaTHIOprine (IMURAN) 50 MG tablet Take 100 mg by mouth in the morning and at bedtime. 05/04/20   [provider]  azaTHIOprine (IMURAN) 50 MG tablet Take 1 tablet by mouth daily. 07/11/22   [provider]  benzonatate (TESSALON) 100 MG capsule Take 1 capsule (100 mg total) by mouth every 8 (eight) hours as needed for cough. 04/01/23   Gustavus Bryant, FNP  blood glucose meter kit and supplies KIT Dispense based on patient and insurance preference. Use up to four times daily as directed. 12/13/21   Panosh, Neta Mends, MD  brimonidine (ALPHAGAN) 0.2 % ophthalmic solution Place 1 drop into both eyes every 8 (eight) hours. 05/13/20   [provider]  carvedilol (COREG) 12.5 MG tablet Take 0.5 tablets (6.25 mg total) by mouth 2 (two) times daily with a meal. After 2 weeks can increase to 12.5 twice a day for BP control . Stop the clonidine 05/23/23   Panosh, Neta Mends, MD  clobetasol ointment (TEMOVATE) 0.05 % Apply 1 Application topically 2 (two) times daily. 05/21/23   [provider]  dextromethorphan-guaiFENesin (MUCINEX DM) 30-600 MG 12hr tablet Take 1 tablet by mouth daily as needed. 05/25/20   [provider]  dorzolamide-timolol (COSOPT) 22.3-6.8 MG/ML ophthalmic solution Place 1 drop into both eyes in the morning and at bedtime. Patient not taking: Reported on 05/23/2023 05/01/20   [provider]  FARXIGA 10 MG TABS tablet TAKE 1 TABLET BY MOUTH EVERY DAY BEFORE BREAKFAST 04/18/23    Panosh, Neta Mends, MD  ferrous sulfate 325 (65 FE) MG tablet Take 650 mg by mouth daily with breakfast.    [provider]  ferrous sulfate 325 (65 FE) MG tablet Take 325 mg by mouth daily with breakfast. 08/12/20   [provider]  Fluticasone-Salmeterol (ADVAIR DISKUS) 250-50 MCG/DOSE AEPB Inhale 1 puff into the lungs in the morning and at bedtime. 05/25/20 05/25/21  [place], Alvira Philips, DO  folic acid (FOLVITE) 1 MG tablet Take 1 tablet by mouth daily.  11/19/18   [provider]  ibuprofen (ADVIL) 600 MG tablet Take by mouth. 07/02/15   [provider]  ibuprofen (ADVIL) 600 MG tablet Take 600 mg by mouth every 8 (eight) hours as needed. 07/02/15   [provider]  ipratropium-albuterol (DUONEB) 0.5-2.5 (3) MG/3ML SOLN TAKE 3 MLS BY NEBULIZATION EVERY 4 HOURS AS NEEDED (SHORTNESS OF BREATH/WHEEZING). 11/20/22   Panosh, Neta Mends, MD  latanoprost (XALATAN) 0.005 % ophthalmic solution Place 1 drop into both eyes at bedtime. 03/22/20   [provider]  lisinopril-hydrochlorothiazide (ZESTORETIC) 20-25 MG tablet Take 1 tablet by mouth every morning. 06/02/20   [provider]  nystatin ointment (MYCOSTATIN) Apply 1 application topically in the morning and at bedtime. Patient not taking: Reported on 03/22/2023 05/12/20   [provider]  prednisoLONE acetate (PRED FORTE) 1 % ophthalmic suspension Place 1 drop into both eyes 2 (two) times daily.    [provider]  rosuvastatin (CRESTOR) 10 MG tablet TAKE 1 TABLET BY MOUTH EVERY DAY 11/20/22   Panosh, Neta Mends, MD  tacrolimus (PROGRAF) 1 MG capsule Take by mouth. 01/18/21   [provider]  tacrolimus (PROGRAF) 1 MG capsule Take 1 mg by mouth 2 (two) times daily. 07/11/22   [provider]  tiotropium (SPIRIVA HANDIHALER) 18 MCG inhalation capsule Place 1 capsule (18 mcg total) into inhaler and inhale daily. 05/25/20 05/25/21  [place], Alvira Philips, DO  triamcinolone cream (KENALOG) 0.1  % Apply 1 Application topically 2 (two) times daily. 12/11/22   Burchette, Elberta Fortis, MD  triamcinolone ointment (KENALOG) 0.1 % Apply 1 Application topically as directed. 05/12/20   [provider]  valACYclovir (VALTREX) 1000 MG tablet Take 1,000 mg by mouth 3 (three) times daily. 03/11/21   [provider]  valACYclovir (VALTREX) 1000 MG tablet Take 1,000 mg by mouth 2 (two) times daily. 07/11/22   [provider]  valsartan-hydrochlorothiazide (DIOVAN-HCT) 160-25 MG tablet TAKE 1 TABLET BY MOUTH DAILY. CHANGE FROM LSINIOPRIL HCTZ 02/05/23   Eulis Foster, FNP    Family History Family History  Problem Relation Age of Onset   Hypertension Mother    Diabetes Mother    Thyroid disease Mother    Diabetes Father    Hypertension Father    Lupus Sister    Rheum arthritis Sister    Other Sister        lamb disease   Stroke Sister    Diabetes Brother    Diabetes Son    Colon cancer Neg Hx    Colon polyps Neg Hx    Esophageal cancer Neg Hx    Rectal cancer Neg Hx    Stomach cancer Neg Hx     Social History Social History   Tobacco Use   Smoking status: Never   Smokeless tobacco: Never  Vaping Use   Vaping status: Never Used  Substance Use Topics   Alcohol use: No   Drug use: No     Allergies   Patient has no known allergies.   Review of Systems Review of Systems  Constitutional:  Negative for chills and fever.  HENT:  Negative for congestion, ear pain, sinus pressure and sore throat.   Eyes:  Negative for discharge and redness.  Respiratory:  Negative for cough, shortness of breath and wheezing.   Gastrointestinal:  Negative for abdominal pain, diarrhea, nausea and vomiting.  Neurological:  Positive for headaches. Negative for dizziness, light-headedness and numbness.     Physical Exam Triage Vital Signs  ED Triage Vitals  Encounter Vitals Group     BP 06/26/23 1506 114/65     Systolic BP Percentile --      Diastolic BP Percentile --       Pulse Rate 06/26/23 1506 78     Resp 06/26/23 1506 18     Temp 06/26/23 1506 97.8 F (36.6 C)     Temp Source 06/26/23 1506 Oral     SpO2 06/26/23 1506 97 %     Weight 06/26/23 1503 180 lb (81.6 kg)     Height 06/26/23 1503 5\' 3"  (1.6 m)     Head Circumference --      Peak Flow --      Pain Score 06/26/23 1459 7     Pain Loc --      Pain Education --      Exclude from Growth Chart --    No data found.  Updated Vital Signs BP 114/65 (BP Location: Left Arm)   Pulse 78   Temp 97.8 F (36.6 C) (Oral)   Resp 18   Ht 5\' 3"  (1.6 m)   Wt 180 lb (81.6 kg)   LMP  (LMP Unknown)   SpO2 97%   BMI 31.89 kg/m   Visual Acuity Right Eye Distance:   Left Eye Distance:   Bilateral Distance:    Right Eye Near:   Left Eye Near:    Bilateral Near:     Physical Exam Vitals and nursing note reviewed.  Constitutional:      General: She is not in acute distress.    Appearance: Normal appearance. She is not ill-appearing.  HENT:     Head: Normocephalic and atraumatic.     Nose: No congestion.     Mouth/Throat:     Mouth: Mucous membranes are moist.     Pharynx: No oropharyngeal exudate or posterior oropharyngeal erythema.  Eyes:     Extraocular Movements: Extraocular movements intact.     Conjunctiva/sclera: Conjunctivae normal.     Pupils: Pupils are equal, round, and reactive to light.  Cardiovascular:     Rate and Rhythm: Normal rate and regular rhythm.     Heart sounds: Normal heart sounds. No murmur heard. Pulmonary:     Effort: Pulmonary effort is normal. No respiratory distress.     Breath sounds: Normal breath sounds. No wheezing, rhonchi or rales.  Skin:    General: Skin is warm and dry.  Neurological:     Mental Status: She is alert.  Psychiatric:        Mood and Affect: Mood normal.        Thought Content: Thought content normal.      UC Treatments / Results  Labs (all labs ordered are listed, but only abnormal results are displayed) Labs Reviewed  POC  COVID19/FLU A&B COMBO - Normal    EKG   Radiology No results found.  Procedures Procedures (including critical care time)  Medications Ordered in UC Medications  ketorolac (TORADOL) 30 MG/ML injection 30 mg (30 mg Intramuscular Given 06/26/23 1624)    Initial Impression / Assessment and Plan / UC Course  I have reviewed the triage vital signs and the nursing notes.  Pertinent labs & imaging results that were available during my care of the patient were reviewed by me and considered in my medical decision making (see chart for details).    Will trial toradol injection in hopes to improve headache. Advised follow up in the ED if no improvement or  with any worsening symptoms.  Final Clinical Impressions(s) / UC Diagnoses   Final diagnoses:  Acute nonintractable headache, unspecified headache type     Discharge Instructions      Please report to ED if headache persists after injection or worsens.    ED Prescriptions   None    PDMP not reviewed this encounter.   Tomi Bamberger, PA-C 07/03/23 [phone]

## 2023-07-26 ENCOUNTER — Other Ambulatory Visit (INDEPENDENT_AMBULATORY_CARE_PROVIDER_SITE_OTHER)

## 2023-07-26 DIAGNOSIS — E1169 Type 2 diabetes mellitus with other specified complication: Secondary | ICD-10-CM

## 2023-07-26 DIAGNOSIS — E1165 Type 2 diabetes mellitus with hyperglycemia: Secondary | ICD-10-CM

## 2023-07-26 DIAGNOSIS — E063 Autoimmune thyroiditis: Secondary | ICD-10-CM | POA: Diagnosis not present

## 2023-07-26 DIAGNOSIS — E785 Hyperlipidemia, unspecified: Secondary | ICD-10-CM

## 2023-07-26 DIAGNOSIS — Z79899 Other long term (current) drug therapy: Secondary | ICD-10-CM

## 2023-07-26 DIAGNOSIS — I10 Essential (primary) hypertension: Secondary | ICD-10-CM

## 2023-07-26 LAB — LIPID PANEL
Cholesterol: 150 mg/dL (ref 0–200)
HDL: 53.3 mg/dL (ref >39.00)
LDL Cholesterol: 81 mg/dL (ref 0–99)
NonHDL: 96.85
Total CHOL/HDL Ratio: 3
Triglycerides: 80 mg/dL (ref 0.0–149.0)
VLDL: 16 mg/dL (ref 0.0–40.0)

## 2023-07-26 LAB — TSH: TSH: 3.35 u[IU]/mL (ref 0.35–5.50)

## 2023-07-26 LAB — HEPATIC FUNCTION PANEL
ALT: 20 U/L (ref 0–35)
AST: 21 U/L (ref 0–37)
Albumin: 4.6 g/dL (ref 3.5–5.2)
Alkaline Phosphatase: 61 U/L (ref 39–117)
Bilirubin, Direct: 0.1 mg/dL (ref 0.0–0.3)
Total Bilirubin: 0.5 mg/dL (ref 0.2–1.2)
Total Protein: 7.8 g/dL (ref 6.0–8.3)

## 2023-07-26 LAB — BASIC METABOLIC PANEL
BUN: 17 mg/dL (ref 6–23)
CO2: 31 meq/L (ref 19–32)
Calcium: 10.3 mg/dL (ref 8.4–10.5)
Chloride: 102 meq/L (ref 96–112)
Creatinine, Ser: 0.77 mg/dL (ref 0.40–1.20)
GFR: 84.95 mL/min (ref >60.00)
Glucose, Bld: 106 mg/dL — ABNORMAL HIGH (ref 70–99)
Potassium: 3.4 meq/L — ABNORMAL LOW (ref 3.5–5.1)
Sodium: 140 meq/L (ref 135–145)

## 2023-07-26 LAB — MICROALBUMIN / CREATININE URINE RATIO
Creatinine,U: 34.1 mg/dL
Microalb Creat Ratio: UNDETERMINED mg/g (ref 0.0–30.0)
Microalb, Ur: <0.7 mg/dL

## 2023-07-26 LAB — T4, FREE: Free T4: 0.63 ng/dL (ref 0.60–1.60)

## 2023-07-26 LAB — HEMOGLOBIN A1C: Hgb A1c MFr Bld: 6 % (ref 4.6–6.5)

## 2023-07-29 ENCOUNTER — Encounter: Payer: Self-pay | Admitting: Internal Medicine

## 2023-07-29 NOTE — Progress Notes (Signed)
 Potassium still slightly low again . Kidney function good. Thyroid in range   Add potassium kcl 20 meq per day  disp 30 refill and we can recheck at fu visit  in April ( K please send in this medication and contact patient about plan )

## 2023-07-31 ENCOUNTER — Other Ambulatory Visit: Payer: Self-pay | Admitting: Internal Medicine

## 2023-08-01 ENCOUNTER — Other Ambulatory Visit: Payer: Self-pay | Admitting: *Deleted

## 2023-08-01 MED ORDER — POTASSIUM CHLORIDE CRYS ER 20 MEQ PO TBCR
20.0000 meq | EXTENDED_RELEASE_TABLET | Freq: Every day | ORAL | 0 refills | Status: DC
Start: 2023-08-01 — End: 2023-09-20

## 2023-08-09 ENCOUNTER — Other Ambulatory Visit: Payer: Self-pay | Admitting: Family

## 2023-08-15 ENCOUNTER — Ambulatory Visit
Admission: EM | Admit: 2023-08-15 | Discharge: 2023-08-15 | Disposition: A | Discharge status: Home / Self Care | Attending: Nurse Practitioner | Admitting: Nurse Practitioner

## 2023-08-15 ENCOUNTER — Other Ambulatory Visit: Payer: Self-pay

## 2023-08-15 ENCOUNTER — Encounter: Payer: Self-pay | Admitting: *Deleted

## 2023-08-15 DIAGNOSIS — K0889 Other specified disorders of teeth and supporting structures: Secondary | ICD-10-CM

## 2023-08-15 DIAGNOSIS — K029 Dental caries, unspecified: Secondary | ICD-10-CM

## 2023-08-15 LAB — POCT FASTING CBG KUC MANUAL ENTRY: POCT Glucose (KUC): 140 mg/dL — AB (ref 70–99)

## 2023-08-15 MED ORDER — AMOXICILLIN-POT CLAVULANATE 875-125 MG PO TABS: 1.0000 | ORAL_TABLET | Freq: Two times a day (BID) | ORAL | 0 refills | Status: DC

## 2023-08-15 MED ORDER — IBUPROFEN 800 MG PO TABS: 800.0000 mg | ORAL_TABLET | Freq: Three times a day (TID) | ORAL | 0 refills | Status: AC | PRN

## 2023-08-15 MED ORDER — CHLORHEXIDINE GLUCONATE 0.12 % MT SOLN: 15.0000 mL | Freq: Two times a day (BID) | OROMUCOSAL | 0 refills | Status: AC

## 2023-08-15 NOTE — Discharge Instructions (Addendum)
 You were seen today for dental pain. It appears that you have significant tooth decay, which is likely the cause of your discomfort. It is very important that you see a dentist as soon as possible to fully treat the problem. Take the antibiotics exactly as prescribed. Use the prescribed mouth rinse twice a day after brushing your teeth. Rinse your mouth with water after eating to help keep the area clean. Take the prescribed pain medication as needed for relief. Avoid any foods or drinks that make the pain worse, and stick to soft foods while your mouth is healing. Do not use straws, as this can slow the healing process.  Go to the ED immediately if:  You are unable to open your mouth. You are having trouble breathing or swallowing. You have a fever. You notice that your face, neck, or jaw is swollen.

## 2023-08-15 NOTE — ED Provider Notes (Signed)
 [name]    CSN: [id number] Arrival date & time: 08/15/23  1114      History   Chief Complaint Chief Complaint  Patient presents with   Facial Swelling    HPI Monica Estrada is a 58 y.o. female.   History of Present Illness  Monica Estrada is a 58 year old female who presents with a complaint of toothache that began on August 12, 2023. The pain is localized to the left upper molars. She also reports left-sided facial swelling, headache, and nausea. She mentions having a mild sore throat but denies any difficulty swallowing. She has not had a fever but has experienced some chills. She denies neck pain, stiffness, or any recent illness. For the pain, she has tried Tylenol and Motrin with limited relief. She does not smoke and denies any other complaints at this time.  The following portions of the patient's history were reviewed and updated as appropriate: allergies, current medications, past family history, past medical history, past social history, past surgical history, and problem list.             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   Hypertensive retinopathy of both eyes 12/07/2015   Nuclear sclerotic cataract of both eyes 09/28/2015   Posterior synechiae (iris), 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   Wheezing 04/01/2015   Essential hypertension 04/17/2014   TSH elevation 04/17/2014    Medication management 04/17/2014   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   Cystoid macular edema, right 01/31/2013   Uveitis, intermediate, right 01/31/2013   History of hypokalemia 11/26/2012   Tingling 11/26/2012   BMI 32.0-32.9,adult 01/08/2012   Umbilical hernia  possible from lap site  12/05/2011   Preventative health care 11/12/2010   Uveitis    History of hypothyroidism    NONSPECIFIC ABN FINDING RAD & OTH EXAM GI TRACT 11/25/2009   Anemia 11/03/2009   AUTOIMMUNE THYROIDITIS 11/20/2008   UVEITIS 12/12/2006   Hyperlipemia 12/11/2006    Past Surgical History:  Procedure Laterality Date   CHOLECYSTECTOMY     1999   COLONOSCOPY     TUBAL LIGATION  12/31/2010   Procedure: BILATERAL TUBAL LIGATION;  Surgeon: Leighton Roach Meisinger;  Location: [facility];  Service: Gynecology;  Laterality: Bilateral;  Bilateral post partum tubal ligation    OB History     Gravida  2   Para  2   Term  2   Preterm  0   AB  0   Living  2      SAB  0   IAB  0   Ectopic  0   Multiple  0   Live Births  1            Home Medications    Prior to Admission medications  Medication Sig Start Date End Date Taking? Authorizing Provider  ACCU-CHEK GUIDE test strip TEST UP TO 4 TIMES DAILY AS DIRECTED 10/28/22  Yes Worthy Rancher B, FNP  Accu-Chek Softclix Lancets lancets TEST UP TO 4 TIMES DAILY 01/04/22  Yes Panosh, Neta Mends, MD  acetaZOLAMIDE (DIAMOX) 250 MG tablet Take 500 mg by mouth in the morning and at bedtime. 05/04/20  Yes [provider]  adalimumab (HUMIRA, 2 PEN,) 40 MG/0.4ML pen Inject 0.4 mLs into the skin as directed. 07/11/22 05/21/24 Yes [provider]  albuterol (VENTOLIN HFA) 108 (90 Base) MCG/ACT inhaler Inhale 2 puffs into the lungs every 6 (six) hours as needed for wheezing or shortness of breath. 08/07/22  Yes Cheron Schaumann K, PA-C  amLODipine (NORVASC) 10 MG tablet TAKE 1 TABLET BY MOUTH EVERY DAY 11/20/22  Yes Panosh, Neta Mends, MD  amoxicillin-clavulanate  (AUGMENTIN) 875-125 MG tablet Take 1 tablet by mouth every 12 (twelve) hours. 08/15/23  Yes Lurline Idol, FNP  aspirin 81 MG EC tablet Take by mouth.   Yes [provider]  atropine 1 % ophthalmic solution Place 1 drop into the right eye 2 (two) times daily.   Yes [provider]  azaTHIOprine (IMURAN) 50 MG tablet Take 1 tablet by mouth daily. 07/11/22  Yes [provider]  blood glucose meter kit and supplies KIT Dispense based on patient and insurance preference. Use up to four times daily as directed. 12/13/21  Yes Panosh, Neta Mends, MD  brimonidine (ALPHAGAN) 0.2 % ophthalmic solution Place 1 drop into both eyes every 8 (eight) hours. 05/13/20  Yes [provider]  carvedilol (COREG) 12.5 MG tablet Take 0.5 tablets (6.25 mg total) by mouth 2 (two) times daily with a meal. After 2 weeks can increase to 12.5 twice a day for BP control . Stop the clonidine 05/23/23  Yes Panosh, Neta Mends, MD  chlorhexidine (PERIDEX) 0.12 % solution Use as directed 15 mLs in the mouth or throat 2 (two) times daily. Swish in mouth for 30 seconds then spit out twice a day after brushing teeth. Do not eat or drink for at least 30 minutes afterwards 08/15/23  Yes Lurline Idol, FNP  clobetasol ointment (TEMOVATE) 0.05 % Apply 1 Application topically 2 (two) times daily. 05/21/23  Yes [provider]  dextromethorphan-guaiFENesin (MUCINEX DM) 30-600 MG 12hr tablet Take 1 tablet by mouth daily as needed. 05/25/20  Yes [provider]  FARXIGA 10 MG TABS tablet TAKE 1 TABLET BY MOUTH EVERY DAY BEFORE BREAKFAST 07/31/23  Yes Panosh, Neta Mends, MD  ferrous sulfate 325 (65 FE) MG tablet Take 325 mg by mouth daily with breakfast. 08/12/20  Yes [provider]  folic acid (FOLVITE) 1 MG tablet Take 1 tablet by mouth daily. 11/19/18  Yes [provider]  ibuprofen (ADVIL) 800 MG tablet Take 1 tablet (800 mg total) by mouth every 8 (eight) hours as needed (PAIN). 08/15/23  Yes  Aaima Gaddie, Lelon Mast, FNP  ipratropium-albuterol (DUONEB) 0.5-2.5 (3) MG/3ML SOLN TAKE 3 MLS BY NEBULIZATION EVERY 4 HOURS AS NEEDED (SHORTNESS OF BREATH/WHEEZING). 11/20/22  Yes Panosh, Neta Mends, MD  latanoprost (XALATAN) 0.005 % ophthalmic solution Place 1 drop into both eyes at bedtime. 03/22/20  Yes [provider]  nystatin ointment (MYCOSTATIN) Apply 1 application  topically in the morning and at bedtime. 05/12/20  Yes [provider]  potassium chloride SA (KLOR-CON M) 20 MEQ tablet Take 1 tablet (20 mEq total) by mouth daily. 08/01/23  Yes Panosh, Neta Mends,  MD  prednisoLONE acetate (PRED FORTE) 1 % ophthalmic suspension Place 1 drop into both eyes 2 (two) times daily.   Yes [provider]  rosuvastatin (CRESTOR) 10 MG tablet TAKE 1 TABLET BY MOUTH EVERY DAY 11/20/22  Yes Panosh, Neta Mends, MD  tacrolimus (PROGRAF) 1 MG capsule Take 1 mg by mouth 2 (two) times daily. 07/11/22  Yes [provider]  tiotropium (SPIRIVA HANDIHALER) 18 MCG inhalation capsule Place 1 capsule (18 mcg total) into inhaler and inhale daily. 05/25/20 08/15/23 Yes [place], Eric J, DO  triamcinolone cream (KENALOG) 0.1 % Apply 1 Application topically 2 (two) times daily. 12/11/22  Yes Burchette, Elberta Fortis, MD  valACYclovir (VALTREX) 1000 MG tablet Take 1,000 mg by mouth 2 (two) times daily. 07/11/22  Yes [provider]  valsartan-hydrochlorothiazide (DIOVAN-HCT) 160-25 MG tablet TAKE 1 TABLET BY MOUTH DAILY. CHANGE FROM LSINIOPRIL HCTZ 02/05/23  Yes Worthy Rancher B, FNP  acetaminophen (TYLENOL) 325 MG suppository Place 325 mg rectally every 4 (four) hours as needed. Patient not taking: Reported on 08/15/2023    [provider]  Adalimumab 40 MG/0.4ML PNKT Inject into the skin. 01/18/21   [provider]  albuterol (VENTOLIN HFA) 108 (90 Base) MCG/ACT inhaler Inhale 2 puffs into the lungs every 4 (four) hours as needed for wheezing or shortness of breath. 04/01/15   [provider]  amLODipine (NORVASC) 10 MG tablet Take 10 mg by mouth daily. 12/31/12   [provider]  aspirin EC 81 MG tablet Take 81 mg by mouth daily. 08/12/20   [provider]  azaTHIOprine (IMURAN) 50 MG tablet Take 100 mg by mouth in the morning and at bedtime. 05/04/20   [provider]  benzonatate (TESSALON) 100 MG capsule Take 1 capsule (100 mg total) by mouth every 8 (eight) hours as needed for cough. Patient not taking: Reported on 08/15/2023 04/01/23   Gustavus Bryant, FNP  dorzolamide-timolol (COSOPT) 22.3-6.8 MG/ML ophthalmic solution Place 1 drop into both eyes in the morning and at bedtime. Patient not taking: Reported on 05/23/2023 05/01/20   [provider]  ferrous sulfate 325 (65 FE) MG tablet Take 650 mg by mouth daily with breakfast.    [provider]  Fluticasone-Salmeterol (ADVAIR DISKUS) 250-50 MCG/DOSE AEPB Inhale 1 puff into the lungs in the morning and at bedtime. Patient not taking: Reported on 08/15/2023 05/25/20 05/25/21  [place], Eric J, DO  lisinopril-hydrochlorothiazide (ZESTORETIC) 20-25 MG tablet Take 1 tablet by mouth every morning. Patient not taking: Reported on 08/15/2023 06/02/20   [provider]  tacrolimus (PROGRAF) 1 MG capsule Take by mouth. 01/18/21   [provider]  triamcinolone ointment (KENALOG) 0.1 % Apply 1 Application topically as directed. 05/12/20   [provider]  valACYclovir (VALTREX) 1000 MG tablet Take 1,000 mg by mouth 3 (three) times daily. 03/11/21   [provider]    Family History Family History  Problem Relation Age of Onset   Hypertension Mother    Diabetes Mother    Thyroid disease Mother    Diabetes Father    Hypertension Father    Lupus Sister    Rheum arthritis Sister    Other Sister        lamb disease   Stroke Sister    Diabetes Brother    Diabetes Son    Colon cancer Neg Hx    Colon polyps Neg Hx    Esophageal cancer Neg Hx    Rectal  cancer Neg Hx  Stomach cancer Neg Hx     Social History Social History   Tobacco Use   Smoking status: Never   Smokeless tobacco: Never  Vaping Use   Vaping status: Never Used  Substance Use Topics   Alcohol use: No   Drug use: No     Allergies   Patient has no known allergies.   Review of Systems Review of Systems  Constitutional:  Positive for appetite change, chills and fatigue. Negative for fever.  HENT:  Positive for dental problem and sore throat. Negative for congestion and trouble swallowing.   Respiratory:  Negative for cough and shortness of breath.   Gastrointestinal:  Negative for nausea and vomiting.  Neurological:  Positive for headaches. Negative for dizziness.  All other systems reviewed and are negative.    Physical Exam Triage Vital Signs ED Triage Vitals  Encounter Vitals Group     BP 08/15/23 1137 94/64     Systolic BP Percentile --      Diastolic BP Percentile --      Pulse Rate 08/15/23 1137 84     Resp 08/15/23 1137 18     Temp 08/15/23 1137 100.1 F (37.8 C)     Temp Source 08/15/23 1137 Oral     SpO2 08/15/23 1137 92 %     Weight --      Height --      Head Circumference --      Peak Flow --      Pain Score 08/15/23 1123 6     Pain Loc --      Pain Education --      Exclude from Growth Chart --    No data found.  Updated Vital Signs BP 108/71 (BP Location: Left Arm)   Pulse 89   Temp 99.4 F (37.4 C) (Oral)   Resp 16   LMP  (LMP Unknown)   SpO2 94%   Visual Acuity Right Eye Distance:   Left Eye Distance:   Bilateral Distance:    Right Eye Near:   Left Eye Near:    Bilateral Near:     Physical Exam Vitals reviewed.  Constitutional:      Appearance: Normal appearance.  HENT:     Head: Normocephalic.     Jaw: There is normal jaw occlusion. No trismus, tenderness, swelling, pain on movement or malocclusion.     Nose: Nose normal.     Mouth/Throat:     Lips: Pink.     Mouth: Mucous membranes are moist.      Dentition: Dental tenderness and dental caries present. No gingival swelling, dental abscesses or gum lesions.     Tongue: No lesions.     Pharynx: Oropharynx is clear. Uvula midline. No pharyngeal swelling, oropharyngeal exudate or posterior oropharyngeal erythema.  Eyes:     Pupils: Pupils are equal, round, and reactive to light.  Cardiovascular:     Rate and Rhythm: Normal rate and regular rhythm.  Pulmonary:     Effort: Pulmonary effort is normal.  Musculoskeletal:        General: Normal range of motion.     Cervical back: Normal range of motion and neck supple.  Lymphadenopathy:     Cervical: No cervical adenopathy.  Skin:    General: Skin is warm and dry.  Neurological:     General: No focal deficit present.     Mental Status: She is alert and oriented to person, place, and time.  Psychiatric:  Mood and Affect: Mood normal.        Behavior: Behavior normal.      UC Treatments / Results  Labs (all labs ordered are listed, but only abnormal results are displayed) Labs Reviewed  POCT FASTING CBG [facility] MANUAL ENTRY - Abnormal; Notable for the following components:      Result Value   POCT Glucose ([facility]) 140 (*)    All other components within normal limits    EKG   Radiology No results found.  Procedures Procedures (including critical care time)  Medications Ordered in UC Medications - No data to display  Initial Impression / Assessment and Plan / UC Course  I have reviewed the triage vital signs and the nursing notes.  Pertinent labs & imaging results that were available during my care of the patient were reviewed by me and considered in my medical decision making (see chart for details).    58 year old female presenting with left upper toothache with associated facial swelling, headache, and nausea. She denies fever at home, neck pain, or recent illness, but was found to have a low-grade fever in clinic. Physical exam reveals poor dentition throughout with  multiple dental caries noted, though no obvious abscess is identified on visual inspection. The patient was prescribed Augmentin for infection coverage, Peridex for oral hygiene, and Motrin for pain and inflammation. She was advised to eat soft foods and avoid the use of straws. A follow-up with a dental provider is strongly recommended as soon as possible. The patient reports difficulty accessing dental care due to lack of providers accepting Medicaid. A list of local dental resources was provided in hopes she can find a provider who accepts her insurance and can see her soon. Emergency department precautions were reviewed thoroughly.  Today's evaluation has revealed no signs of a dangerous process. Discussed diagnosis with patient and/or guardian. Patient and/or guardian aware of their diagnosis, possible red flag symptoms to watch out for and need for close follow up. Patient and/or guardian understands verbal and written discharge instructions. Patient and/or guardian comfortable with plan and disposition.  Patient and/or guardian has a clear mental status at this time, good insight into illness (after discussion and teaching) and has clear judgment to make decisions regarding their care  Documentation was completed with the aid of voice recognition software. Transcription may contain typographical errors. Final Clinical Impressions(s) / UC Diagnoses   Final diagnoses:  Pain, dental  Dental caries     Discharge Instructions      You were seen today for dental pain. It appears that you have significant tooth decay, which is likely the cause of your discomfort. It is very important that you see a dentist as soon as possible to fully treat the problem. Take the antibiotics exactly as prescribed. Use the prescribed mouth rinse twice a day after brushing your teeth. Rinse your mouth with water after eating to help keep the area clean. Take the prescribed pain medication as needed for relief. Avoid any  foods or drinks that make the pain worse, and stick to soft foods while your mouth is healing. Do not use straws, as this can slow the healing process.  Go to the ED immediately if:  You are unable to open your mouth. You are having trouble breathing or swallowing. You have a fever. You notice that your face, neck, or jaw is swollen.       ED Prescriptions     Medication Sig Dispense Auth. Provider   amoxicillin-clavulanate (AUGMENTIN)  875-125 MG tablet Take 1 tablet by mouth every 12 (twelve) hours. 14 tablet Mayerly Kaman, [place], FNP   chlorhexidine (PERIDEX) 0.12 % solution Use as directed 15 mLs in the mouth or throat 2 (two) times daily. Swish in mouth for 30 seconds then spit out twice a day after brushing teeth. Do not eat or drink for at least 30 minutes afterwards 120 mL Lurline Idol, FNP   ibuprofen (ADVIL) 800 MG tablet Take 1 tablet (800 mg total) by mouth every 8 (eight) hours as needed (PAIN). 21 tablet Lurline Idol, FNP      PDMP not reviewed this encounter.   Lurline Idol, [place] 08/15/23 [phone]

## 2023-08-15 NOTE — ED Triage Notes (Signed)
 States "it started like a light toothache then my face started swelling three days ago". "I started wheezing yesterday and I feel real sleepy". Reports she has been "peeing a lot" but also reports she has been drinking a lot of water. C/o facial swelling left side of face with toothache. Speaking full sentences. No acute distress during triage. Also reports "a lot of chills".

## 2023-08-19 ENCOUNTER — Other Ambulatory Visit: Payer: Self-pay | Admitting: Internal Medicine

## 2023-08-21 ENCOUNTER — Encounter: Payer: Self-pay | Admitting: Internal Medicine

## 2023-08-21 ENCOUNTER — Ambulatory Visit (INDEPENDENT_AMBULATORY_CARE_PROVIDER_SITE_OTHER): Payer: Medicaid Other | Admitting: Internal Medicine

## 2023-08-21 ENCOUNTER — Telehealth: Payer: Self-pay

## 2023-08-21 VITALS — BP 125/73 | HR 61 | Temp 97.8°F | Ht 63.0 in | Wt 180.0 lb

## 2023-08-21 DIAGNOSIS — E876 Hypokalemia: Secondary | ICD-10-CM

## 2023-08-21 DIAGNOSIS — I10 Essential (primary) hypertension: Secondary | ICD-10-CM

## 2023-08-21 DIAGNOSIS — E063 Autoimmune thyroiditis: Secondary | ICD-10-CM | POA: Diagnosis not present

## 2023-08-21 DIAGNOSIS — E1165 Type 2 diabetes mellitus with hyperglycemia: Secondary | ICD-10-CM | POA: Diagnosis not present

## 2023-08-21 DIAGNOSIS — Z79899 Other long term (current) drug therapy: Secondary | ICD-10-CM

## 2023-08-21 DIAGNOSIS — E669 Obesity, unspecified: Secondary | ICD-10-CM

## 2023-08-21 DIAGNOSIS — E1169 Type 2 diabetes mellitus with other specified complication: Secondary | ICD-10-CM

## 2023-08-21 DIAGNOSIS — Z7985 Long-term (current) use of injectable non-insulin antidiabetic drugs: Secondary | ICD-10-CM

## 2023-08-21 MED ORDER — OZEMPIC (0.25 OR 0.5 MG/DOSE) 2 MG/3ML ~~LOC~~ SOPN: 0.2500 mg | PEN_INJECTOR | SUBCUTANEOUS | 1 refills | Status: DC

## 2023-08-21 NOTE — Patient Instructions (Signed)
 Bp is very good    For now stay on same medication and the potassium Get lab potassium level in 1-2 weeks  dont have to fast.  Can begin ozempic  weekly starter dose .  After a month   on med   a virtual visit  appt  is ok.

## 2023-08-21 NOTE — Progress Notes (Signed)
 Chief Complaint  Patient presents with   Medical Management of Chronic Issues    Pt is here for her 3 months follow up. Pt brought BP cuff monitor from home.     HPI: Monica Estrada 58 y.o. come in for Chronic disease management   Seen in ed for dental pain 4 2  Under rx for panuveitis  ht thyroiditis   HT has monitor and readings she has recorded are in the 120s and 1teens  nl pulse   No se although ocass can get orthostatic changes   med change changed from lis hydrochlorothiazide to valsartan hydrochlorothiazide and coreg bid now at 12.5 bid  Just started the potassium supp  about 10 days ago  DM level bg  Interested in glp1 meds  Feels  pretty good   ROS: See pertinent positives and negatives per HPI.  Past Medical History:  Diagnosis Date   Abnormal Pap smear    Asthma    Colonic edema    mild left- ed eval abd ct   Gave birth to child recently    2012   History of hypothyroidism 1999   of post partum   Hx of abnormal cervical Pap smear    Hypertension    RSV (respiratory syncytial virus infection)    with wheezing and asthmatic sx   Uveitis    stable   Wheezing 04/01/2015    Family History  Problem Relation Age of Onset   Hypertension Mother    Diabetes Mother    Thyroid disease Mother    Diabetes Father    Hypertension Father    Lupus Sister    Rheum arthritis Sister    Other Sister        lamb disease   Stroke Sister    Diabetes Brother    Diabetes Son    Colon cancer Neg Hx    Colon polyps Neg Hx    Esophageal cancer Neg Hx    Rectal cancer Neg Hx    Stomach cancer Neg Hx     Social History   Socioeconomic History   Marital status: Single    Spouse name: Not on file   Number of children: Not on file   Years of education: Not on file   Highest education level: Not on file  Occupational History   Occupation: retail  Tobacco Use   Smoking status: Never   Smokeless tobacco: Never  Vaping Use   Vaping status: Never Used  Substance  and Sexual Activity   Alcohol use: No   Drug use: No   Sexual activity: Not Currently  Other Topics Concern   Not on file  Social History Narrative   [name]  ABOUT 30 HOURS    Single   HH of 3 son  67 y  And baby girl 2 year  Father out of country comes back ocass. Some financial support .    [place]  [place].   No pets.    Neg ets .    2 jobs  hh of 2  38 yo and older teen son college                Social Drivers of Corporate investment banker Strain: Low Risk  (09/05/2022)   Overall Financial Resource Strain (CARDIA)    Difficulty of Paying Living Expenses: Not hard at all  Food Insecurity: No Food Insecurity (09/05/2022)   Hunger Vital Sign    Worried About Running Out of  Food in the Last Year: Never true    Ran Out of Food in the Last Year: Never true  Transportation Needs: No Transportation Needs (09/05/2022)   PRAPARE - Administrator, Civil Service (Medical): No    Lack of Transportation (Non-Medical): No  Physical Activity: Sufficiently Active (09/05/2022)   Exercise Vital Sign    Days of Exercise per Week: 5 days    Minutes of Exercise per Session: 60 min  Stress: No Stress Concern Present (09/05/2022)   [employer] of Occupational Health - Occupational Stress Questionnaire    Feeling of Stress : Not at all  Social Connections: Moderately Integrated (09/05/2022)   Social Connection and Isolation Panel [NHANES]    Frequency of Communication with Friends and Family: More than three times a week    Frequency of Social Gatherings with Friends and Family: Once a week    Attends Religious Services: More than 4 times per year    Active Member of [employer] or Organizations: Yes    Attends Engineer, structural: More than 4 times per year    Marital Status: Never married    Outpatient Medications Prior to Visit  Medication Sig Dispense Refill   ACCU-CHEK GUIDE test strip TEST UP TO 4 TIMES DAILY AS DIRECTED 100 strip 1   Accu-Chek Softclix Lancets  lancets TEST UP TO 4 TIMES DAILY 100 each 1   acetaZOLAMIDE (DIAMOX) 250 MG tablet Take 500 mg by mouth in the morning and at bedtime.     adalimumab (HUMIRA, 2 PEN,) 40 MG/0.4ML pen Inject 0.4 mLs into the skin as directed.     albuterol (VENTOLIN HFA) 108 (90 Base) MCG/ACT inhaler Inhale 2 puffs into the lungs every 6 (six) hours as needed for wheezing or shortness of breath. 8 g 2   amLODipine (NORVASC) 10 MG tablet TAKE 1 TABLET BY MOUTH EVERY DAY 90 tablet 3   amoxicillin-clavulanate (AUGMENTIN) 875-125 MG tablet Take 1 tablet by mouth every 12 (twelve) hours. 14 tablet 0   aspirin 81 MG EC tablet Take by mouth.     atropine 1 % ophthalmic solution Place 1 drop into the right eye 2 (two) times daily.     azaTHIOprine (IMURAN) 50 MG tablet Take 1 tablet by mouth daily.     blood glucose meter kit and supplies KIT Dispense based on patient and insurance preference. Use up to four times daily as directed. 1 each 0   brimonidine (ALPHAGAN) 0.2 % ophthalmic solution Place 1 drop into both eyes every 8 (eight) hours.     carvedilol (COREG) 12.5 MG tablet TAKE 0.5 TABLETS (6.25 MG TOTAL) BY MOUTH 2 (TWO) TIMES DAILY WITH A MEAL. AFTER 2 WEEKS CAN INCREASE TO 12.5 TWICE A DAY FOR BP CONTROL . STOP THE CLONIDINE 180 tablet 1   chlorhexidine (PERIDEX) 0.12 % solution Use as directed 15 mLs in the mouth or throat 2 (two) times daily. Swish in mouth for 30 seconds then spit out twice a day after brushing teeth. Do not eat or drink for at least 30 minutes afterwards 120 mL 0   clobetasol ointment (TEMOVATE) 0.05 % Apply 1 Application topically 2 (two) times daily.     dextromethorphan-guaiFENesin (MUCINEX DM) 30-600 MG 12hr tablet Take 1 tablet by mouth daily as needed.     FARXIGA 10 MG TABS tablet TAKE 1 TABLET BY MOUTH EVERY DAY BEFORE BREAKFAST 90 tablet 1   ferrous sulfate 325 (65 FE) MG tablet Take 650 mg by  mouth daily with breakfast.     folic acid (FOLVITE) 1 MG tablet Take 1 tablet by mouth daily.      ibuprofen (ADVIL) 800 MG tablet Take 1 tablet (800 mg total) by mouth every 8 (eight) hours as needed (PAIN). 21 tablet 0   ipratropium-albuterol (DUONEB) 0.5-2.5 (3) MG/3ML SOLN TAKE 3 MLS BY NEBULIZATION EVERY 4 HOURS AS NEEDED (SHORTNESS OF BREATH/WHEEZING). 540 mL 3   latanoprost (XALATAN) 0.005 % ophthalmic solution Place 1 drop into both eyes at bedtime.     nystatin ointment (MYCOSTATIN) Apply 1 application  topically in the morning and at bedtime.     potassium chloride SA (KLOR-CON M) 20 MEQ tablet Take 1 tablet (20 mEq total) by mouth daily. 30 tablet 0   prednisoLONE acetate (PRED FORTE) 1 % ophthalmic suspension Place 1 drop into both eyes 2 (two) times daily.     rosuvastatin (CRESTOR) 10 MG tablet TAKE 1 TABLET BY MOUTH EVERY DAY 90 tablet 3   tacrolimus (PROGRAF) 1 MG capsule Take 1 mg by mouth 2 (two) times daily.     triamcinolone cream (KENALOG) 0.1 % Apply 1 Application topically 2 (two) times daily. 30 g 1   valACYclovir (VALTREX) 1000 MG tablet Take 1,000 mg by mouth 2 (two) times daily.     valsartan-hydrochlorothiazide (DIOVAN-HCT) 160-25 MG tablet TAKE 1 TABLET BY MOUTH DAILY. CHANGE FROM LSINIOPRIL HCTZ 90 tablet 1   azaTHIOprine (IMURAN) 50 MG tablet Take 100 mg by mouth in the morning and at bedtime.     tacrolimus (PROGRAF) 1 MG capsule Take by mouth.     Adalimumab 40 MG/0.4ML PNKT Inject into the skin.     benzonatate (TESSALON) 100 MG capsule Take 1 capsule (100 mg total) by mouth every 8 (eight) hours as needed for cough. (Patient not taking: Reported on 08/21/2023) 21 capsule 0   dorzolamide-timolol (COSOPT) 22.3-6.8 MG/ML ophthalmic solution Place 1 drop into both eyes in the morning and at bedtime. (Patient not taking: Reported on 05/23/2023)     Fluticasone-Salmeterol (ADVAIR DISKUS) 250-50 MCG/DOSE AEPB Inhale 1 puff into the lungs in the morning and at bedtime. (Patient not taking: Reported on 08/15/2023) 180 each 3   tiotropium (SPIRIVA HANDIHALER) 18 MCG  inhalation capsule Place 1 capsule (18 mcg total) into inhaler and inhale daily. 90 capsule 3   acetaminophen (TYLENOL) 325 MG suppository Place 325 mg rectally every 4 (four) hours as needed. (Patient not taking: Reported on 08/21/2023)     albuterol (VENTOLIN HFA) 108 (90 Base) MCG/ACT inhaler Inhale 2 puffs into the lungs every 4 (four) hours as needed for wheezing or shortness of breath.     amLODipine (NORVASC) 10 MG tablet Take 10 mg by mouth daily.     aspirin EC 81 MG tablet Take 81 mg by mouth daily.     ferrous sulfate 325 (65 FE) MG tablet Take 325 mg by mouth daily with breakfast.     lisinopril-hydrochlorothiazide (ZESTORETIC) 20-25 MG tablet Take 1 tablet by mouth every morning. (Patient not taking: Reported on 08/21/2023)     triamcinolone ointment (KENALOG) 0.1 % Apply 1 Application topically as directed.     valACYclovir (VALTREX) 1000 MG tablet Take 1,000 mg by mouth 3 (three) times daily.     No facility-administered medications prior to visit.     EXAM:  BP 125/73 (BP Location: Left Arm, Patient Position: Sitting) Comment: Taken with pt's BP monitor.  Pulse 61   Temp 97.8 F (36.6 C) (Oral)  Ht 5\' 3"  (1.6 m)   Wt 180 lb (81.6 kg)   LMP  (LMP Unknown)   SpO2 95%   BMI 31.89 kg/m   Body mass index is 31.89 kg/m. Wt Readings from Last 3 Encounters:  08/21/23 180 lb (81.6 kg)  06/26/23 180 lb (81.6 kg)  05/23/23 185 lb 6.4 oz (84.1 kg)    GENERAL: vitals reviewed and listed above, alert, oriented, appears well hydrated and in no acute distress HEENT: atraumatic, conjunctiva  clear, no obvious abnormalities on inspection of external nose and ears NECK: no obvious masses on inspection palpation  LUNGS: clear to auscultation bilaterally, no wheezes, rales or rhonchi, good air movement CV: HRRR, no clubbing cyanosis or  peripheral edema nl cap refill  pulse 70 and regular  MS: moves all extremities without noticeable focal  abnormality PSYCH: pleasant and cooperative,  no obvious depression or anxiety Lab Results  Component Value Date   WBC 5.6 01/08/2023   HGB 14.8 01/08/2023   HCT 42.9 01/08/2023   PLT 187 01/08/2023   GLUCOSE 106 (H) 07/26/2023   CHOL 150 07/26/2023   TRIG 80.0 07/26/2023   HDL 53.30 07/26/2023   LDLDIRECT 144.5 10/31/2010   LDLCALC 81 07/26/2023   ALT 20 07/26/2023   AST 21 07/26/2023   NA 140 07/26/2023   K 3.4 (L) 07/26/2023   CL 102 07/26/2023   CREATININE 0.77 07/26/2023   BUN 17 07/26/2023   CO2 31 07/26/2023   TSH 3.35 07/26/2023   HGBA1C 6.0 07/26/2023   MICROALBUR <0.7 07/26/2023   BP Readings from Last 3 Encounters:  08/21/23 125/73  08/15/23 108/71  06/26/23 114/65  Bp readings reviewed  in range   ASSESSMENT AND PLAN:  Discussed the following assessment and plan:  Type 2 diabetes mellitus with hyperglycemia, without long-term current use of insulin (HCC) - Plan: Basic metabolic panel with GFR  Essential hypertension - Plan: Basic metabolic panel with GFR  Medication management - Plan: Basic metabolic panel with GFR  Thyroiditis, autoimmune  Low blood potassium - Plan: Basic metabolic panel with GFR  Type 2 diabetes mellitus with obesity (HCC) On sglt2 meds  Cautioned about  holding meds with dehydration states  Potassium still  low on hydrochlorothiazide acei and supplement  will recheck in another few weeks . Interesting that it is on th low side with meds she is on. Consider decrease intensity of meds if low bp and has sx but for now continue.  Disc glp1 and wants to begin  sent in will need PA but she has dm and should be candidate.  Has ? About med interaction such as with Humira and I am not aware  but will ask Pharmacy team to reach out and review her meds  and any IA  cautions  -Patient advised to return or notify health care team  if  new concerns arise.  Patient Instructions  Bp is very good    For now stay on same medication and the potassium Get lab potassium level in 1-2 weeks   dont have to fast.  Can begin ozempic  weekly starter dose .  After a month   on med   a virtual visit  appt  is ok.  Neta Mends. Leo Weyandt M.D.

## 2023-08-21 NOTE — Telephone Encounter (Signed)
 Pharmacy Patient Advocate Encounter   Received notification from CoverMyMeds that prior authorization for Ozempic (0.25 or 0.5 MG/DOSE) 2MG /3ML pen-injectors is required/requested.   Insurance verification completed.   The patient is insured through [facility] [place] .   Per test claim: PA required; PA submitted to above mentioned insurance via CoverMyMeds Key/confirmation #/EOC [id number] Status is pending

## 2023-08-22 ENCOUNTER — Telehealth: Payer: Self-pay

## 2023-08-22 DIAGNOSIS — Z79899 Other long term (current) drug therapy: Secondary | ICD-10-CM

## 2023-08-22 NOTE — Telephone Encounter (Signed)
-----   Message from Berniece Andreas sent at 08/21/2023  5:49 PM EDT ----- Hi Georgette Helmer can you review meds with her and check for interactions  I ordered glp1 today

## 2023-08-22 NOTE — Progress Notes (Signed)
   08/22/2023  Patient ID: Monica Estrada, female   DOB: December 21, 1965, 58 y.o.   MRN: [id number]  Placing a referral for pharmacy services at request of PCP. Scheduler will reach out to patient to setup appt.  Sherrill Raring, PharmD Clinical Pharmacist [phone]

## 2023-08-22 NOTE — Telephone Encounter (Signed)
 Pharmacy Patient Advocate Encounter  Received notification from [facility] Medicaid that Prior Authorization for Ozempic 2mg /4ml has been APPROVED from 08/07/23 to 08/20/24   PA #/Case ID/Reference #: [id number]  Approval letter indexed to media tab

## 2023-08-23 NOTE — Telephone Encounter (Signed)
 Attempted to reach pt. Left a voicemail.

## 2023-08-27 ENCOUNTER — Telehealth: Payer: Self-pay | Admitting: *Deleted

## 2023-08-27 ENCOUNTER — Other Ambulatory Visit: Payer: Self-pay | Admitting: Internal Medicine

## 2023-08-27 NOTE — Telephone Encounter (Signed)
 Attempted to reach pt again. Left a voicemail to call us  back.

## 2023-08-27 NOTE — Telephone Encounter (Signed)
 Copied from CRM [id number]. Topic: Clinical - Medication Refill >> Aug 27, 2023  9:10 AM Yolande Hench C wrote: Most Recent Primary Care Visit:  Provider: Daphine Eagle K  Department: [facility]  Visit Type: OFFICE VISIT  Date: 08/21/2023  Medication: valsartan-hydrochlorothiazide (DIOVAN-HCT) 160-25 MG tablet  Has the patient contacted their pharmacy? Yes, patients pharmacy sent in 3 refill request that were not answered; patient called to initiate RX refill with providers clinic (Agent: If no, request that the patient contact the pharmacy for the refill. If patient does not wish to contact the pharmacy document the reason why and proceed with request.) (Agent: If yes, when and what did the pharmacy advise?)  Is this the correct pharmacy for this prescription? Yes If no, delete pharmacy and type the correct one.  This is the patient's preferred pharmacy:  [facility] [address] [place] - [address] [place] [address] Phone: [phone] Fax: [phone]   Has the prescription been filled recently? No  Is the patient out of the medication? Yes  Has the patient been seen for an appointment in the last year OR does the patient have an upcoming appointment? Yes  Can we respond through MyChart? Yes  Agent: Please be advised that Rx refills may take up to 3 business days. We ask that you follow-up with your pharmacy.

## 2023-08-27 NOTE — Progress Notes (Signed)
 Care Guide Pharmacy Note  08/27/2023 Name: Monica Estrada MRN: [id number] DOB: [birth year]  Referred By: Reginal Capra, MD Reason for referral: Complex Care Management and Call Attempt #1 (Outreach to schedule referral with pharmacist )   Monica Estrada is a 58 y.o. year old female who is a primary care patient of Panosh, Wanda K, MD.  Monica Estrada was referred to the pharmacist for assistance related to:  med management   Successful contact was made with the patient to discuss pharmacy services including being ready for the pharmacist to call at least 5 minutes before the scheduled appointment time and to have medication bottles and any blood pressure readings ready for review. The patient agreed to meet with the pharmacist via telephone visit on 08/29/2023  Monica Estrada, CMA [place]  [facility], [facility] Guide Direct Dial: [phone]  Fax: [phone] Website: [email or web address]

## 2023-08-27 NOTE — Progress Notes (Signed)
 Care Guide Pharmacy Note  08/27/2023 Name: Samreet Edenfield MRN: [id number] DOB: 09-14-1965  Referred By: Reginal Capra, MD Reason for referral: Complex Care Management and Call Attempt #1 (Outreach to schedule referral with pharmacist )   Lysette Natali Lavallee is a 58 y.o. year old female who is a primary care patient of Panosh, Wanda K, MD.  Montgomery Apgar Enrico Hartshorn was referred to the pharmacist for assistance related to:  med management   An unsuccessful telephone outreach was attempted today to contact the patient who was referred to the pharmacy team for assistance with medication management. Additional attempts will be made to contact the patient.  Kandis Ormond, CMA [place]  [facility], [facility] Guide Direct Dial: [phone]  Fax: [phone] Website: [email or web address]

## 2023-08-28 MED ORDER — VALSARTAN-HYDROCHLOROTHIAZIDE 160-25 MG PO TABS: 1.0000 | ORAL_TABLET | Freq: Every day | ORAL | 1 refills | Status: DC

## 2023-08-29 ENCOUNTER — Other Ambulatory Visit (INDEPENDENT_AMBULATORY_CARE_PROVIDER_SITE_OTHER)

## 2023-08-29 DIAGNOSIS — Z79899 Other long term (current) drug therapy: Secondary | ICD-10-CM

## 2023-08-29 NOTE — Progress Notes (Signed)
 08/29/2023 Name: Monica Estrada MRN: [id number] DOB: 06-21-1965  Chief Complaint  Patient presents with   Medication Assistance   Medication Management    Monica Estrada is a 58 y.o. year old female who presented for a telephone visit.   They were referred to the pharmacist by their PCP for assistance in managing complex medication management.    Subjective:  Care Team: Primary Care Provider: Madelin Headings, MD    Medication Access/Adherence  Current Pharmacy:  [id number] [place], [place] - [address] [address] Phone: [phone] Fax: [phone]  [id number] [place] [place] - [address] [facility] [address] [place] [address] Phone: [phone] Fax: [phone]   Patient reports affordability concerns with their medications: No  Patient reports access/transportation concerns to their pharmacy: No  Patient reports adherence concerns with their medications:  No     Medication Management:  -Patient concerned about the number of medications she is taking and potential side effects/interactions  Patient reports Good adherence to medications  Patient reports the following barriers to adherence: None  Adherent fill dates on all maintenance medications   Objective:  Lab Results  Component Value Date   HGBA1C 6.0 07/26/2023    Lab Results  Component Value Date   CREATININE 0.77 07/26/2023   BUN 17 07/26/2023   NA 140 07/26/2023   K 3.4 (L) 07/26/2023   CL 102 07/26/2023   CO2 31 07/26/2023    Lab Results  Component Value Date   CHOL 150 07/26/2023   HDL 53.30 07/26/2023   LDLCALC 81 07/26/2023   LDLDIRECT 144.5 10/31/2010   TRIG 80.0 07/26/2023   CHOLHDL 3 07/26/2023    Medications Reviewed Today     Reviewed by Sherrill Raring, RPH (Pharmacist) on 08/29/23 at 1009  Med List Status: <None>   Medication Order Taking? Sig  Documenting Provider Last Dose Status Informant  ACCU-CHEK GUIDE test strip [id number]  TEST UP TO 4 TIMES DAILY AS DIRECTED Worthy Rancher B, FNP  Active   Accu-Chek Softclix Lancets lancets [id number]  TEST UP TO 4 TIMES DAILY Panosh, Neta Mends, MD  Active   adalimumab (HUMIRA, 2 PEN,) 40 MG/0.4ML pen [id number] Yes Inject 0.4 mLs into the skin as directed. [provider] Taking Active   Adalimumab 40 MG/0.4ML PNKT [id number]  Inject into the skin. [provider]  Active   albuterol (VENTOLIN HFA) 108 (90 Base) MCG/ACT inhaler [id number] Yes Inhale 2 puffs into the lungs every 6 (six) hours as needed for wheezing or shortness of breath. Elson Areas, [place] Taking Active   amLODipine (NORVASC) 10 MG tablet [id number] Yes TAKE 1 TABLET BY MOUTH EVERY DAY Panosh, Neta Mends, MD Taking Active   aspirin 81 MG EC tablet [id number] Yes Take by mouth. [provider] Taking Active   atropine 1 % ophthalmic solution [id number] Yes Place 1 drop into the right eye 2 (two) times daily. [provider] Taking Active Self  azaTHIOprine (IMURAN) 50 MG tablet [id number] Yes Take 1 tablet by mouth daily. [provider] Taking Active   blood glucose meter kit and supplies KIT [id number]  Dispense based on patient and insurance preference. Use up to four times daily as directed. Panosh, Neta Mends, MD  Active   brimonidine [facility]) 0.2 % ophthalmic solution [id number] Yes Place 1 drop into both eyes every 8 (eight) hours. [provider] Taking Active Self  carvedilol (COREG) 12.5 MG tablet [id number] Yes TAKE 0.5 TABLETS (6.25 MG TOTAL) BY MOUTH 2 (TWO) TIMES DAILY WITH A MEAL. AFTER 2 WEEKS CAN INCREASE TO 12.5 TWICE A DAY FOR BP CONTROL . STOP THE CLONIDINE Panosh, Neta Mends, MD Taking Active   chlorhexidine (PERIDEX) 0.12 % solution [id number] Yes Use as directed 15 mLs in the mouth or throat 2 (two) times daily. Swish in mouth for 30 seconds then spit out twice a day after  brushing teeth. Do not eat or drink for at least 30 minutes afterwards Lurline Idol, FNP Taking Active   clobetasol ointment (TEMOVATE) 0.05 % [id number]  Apply 1 Application topically 2 (two) times daily. [provider]  Active   dextromethorphan-guaiFENesin [facility] DM) 30-600 MG 12hr tablet [id number] Yes Take 1 tablet by mouth daily as needed. [provider] Taking Active   FARXIGA 10 MG TABS tablet [id number] Yes TAKE 1 TABLET BY MOUTH EVERY DAY BEFORE BREAKFAST Panosh, Neta Mends, MD Taking Active   ferrous sulfate 325 (65 FE) MG tablet [id number] Yes Take 650 mg by mouth daily with breakfast. [provider] Taking Active Self  Fluticasone-Salmeterol (ADVAIR DISKUS) 250-50 MCG/DOSE AEPB [id number]  Inhale 1 puff into the lungs in the morning and at bedtime.  Patient not taking: Reported on 08/15/2023   [place], Eric J, DO  Expired 05/25/21 2359   folic acid (FOLVITE) 1 MG tablet [id number] Yes Take 1 tablet by mouth daily. [provider] Taking Active   ibuprofen (ADVIL) 800 MG tablet [id number] Yes Take 1 tablet (800 mg total) by mouth every 8 (eight) hours as needed (PAIN). Lurline Idol, FNP Taking Active   ipratropium-albuterol (DUONEB) 0.5-2.5 (3) MG/3ML SOLN [id number] Yes TAKE 3 MLS BY NEBULIZATION EVERY 4 HOURS AS NEEDED (SHORTNESS OF BREATH/WHEEZING). Panosh, Neta Mends, MD Taking Active   latanoprost (XALATAN) 0.005 % ophthalmic solution [id number] No Place 1 drop into both eyes at bedtime.  Patient not taking: Reported on 08/29/2023   [provider] Not Taking Active Self  nystatin ointment (MYCOSTATIN) [id number] Yes Apply 1 application  topically in the morning and at bedtime. [provider] Taking Active Self  potassium chloride SA (KLOR-CON M) 20 MEQ tablet [id number] Yes Take 1 tablet (20 mEq total) by mouth daily. Panosh, Neta Mends, MD Taking Active   prednisoLONE acetate (PRED FORTE) 1 % ophthalmic suspension [id number]  Place 1  drop into both eyes 2 (two) times daily. [provider]  Active   rosuvastatin (CRESTOR) 10 MG tablet [id number] Yes TAKE 1 TABLET BY MOUTH EVERY DAY Panosh, Neta Mends, MD Taking Active   Semaglutide,0.25 or 0.5MG /DOS, (OZEMPIC, 0.25 OR 0.5 MG/DOSE,) 2 MG/3ML SOPN [id number] Yes Inject 0.25 mg into the skin once a week. For diabetes Panosh, Neta Mends, MD Taking Active   tacrolimus (PROGRAF) 1 MG capsule [id number] Yes Take 1 mg by mouth 2 (two) times daily. [provider] Taking Active   tiotropium (SPIRIVA HANDIHALER) 18 MCG inhalation capsule [id number]  Place 1 capsule (18 mcg total) into inhaler and inhale daily. [place], Eric J, DO  Expired 08/15/23 2359   triamcinolone cream (KENALOG) 0.1 % [id number]  Apply 1 Application topically 2 (two) times daily. Kristian Covey, MD  Active   valACYclovir (VALTREX) 1000 MG tablet [id number] Yes Take 1,000 mg by mouth 2 (two) times daily. [provider] Taking Active   valsartan-hydrochlorothiazide (DIOVAN-HCT) 160-25 MG tablet [id number] Yes Take 1 tablet by mouth daily. Change from lsiniopril  hctz Panosh, Neta Mends, MD  Taking Active               Assessment/Plan:   Medication Management: - Currently strategy sufficient to maintain appropriate adherence to prescribed medication regimen -Reviewed and updated patient's medication list for accuracy. -Counseled on potential side effects from her new med Ozempic (patient plans to start today 08/29/23) -Counseled on appropriate use of OTC medications to mitigate side effects if needed -Counseled on importance of rotating injection sites   Follow Up Plan: Not indicated at this time, reminded of f/u with PCP, provided my office line for direct contact for med concerns/questions that may arise  Carnell Christian, PharmD Clinical Pharmacist [phone]

## 2023-09-20 ENCOUNTER — Other Ambulatory Visit: Payer: Self-pay | Admitting: Internal Medicine

## 2023-10-19 ENCOUNTER — Other Ambulatory Visit: Payer: Self-pay | Admitting: Internal Medicine

## 2023-10-29 ENCOUNTER — Other Ambulatory Visit: Payer: Self-pay | Admitting: Internal Medicine

## 2023-11-14 ENCOUNTER — Ambulatory Visit: Admitting: Internal Medicine

## 2023-11-23 ENCOUNTER — Other Ambulatory Visit: Payer: Self-pay | Admitting: Internal Medicine

## 2024-01-22 ENCOUNTER — Other Ambulatory Visit: Payer: Self-pay | Admitting: Family

## 2024-02-05 ENCOUNTER — Other Ambulatory Visit: Payer: Self-pay | Admitting: Internal Medicine

## 2024-02-05 NOTE — Telephone Encounter (Unsigned)
 Copied from CRM #[id number]. Topic: Clinical - Medication Refill >> Feb 05, 2024 11:32 AM [name] wrote: Medication: FARXIGA  10 MG TABS tablet  Has the patient contacted their pharmacy? No, patient states she called in refill request other day (Agent: If no, request that the patient contact the pharmacy for the refill. If patient does not wish to contact the pharmacy document the reason why and proceed with request.) (Agent: If yes, when and what did the pharmacy advise?)  This is the patient's preferred pharmacy:  [id number] [address], [place] - [address] [place] [address] Phone: [phone] Fax: [phone]   Is this the correct pharmacy for this prescription? Yes If no, delete pharmacy and type the correct one.   Has the prescription been filled recently? No  Is the patient out of the medication? Yes  Has the patient been seen for an appointment in the last year OR does the patient have an upcoming appointment? Yes  Can we respond through MyChart? Yes  Agent: Please be advised that Rx refills may take up to 3 business days. We ask that you follow-up with your pharmacy.

## 2024-02-05 NOTE — Telephone Encounter (Unsigned)
 Copied from CRM #[id number]. Topic: Clinical - Prescription Issue >> Feb 05, 2024 11:36 AM [name] wrote: Reason for CRM: Patient following up on refill request for the Semaglutide ,0.25 or 0.5MG /DOS, (OZEMPIC , 0.25 OR 0.5 MG/DOSE,) 2 MG/3ML SOPN shows pending since 9/09   Monica Estrada [phone]

## 2024-02-06 ENCOUNTER — Telehealth: Payer: Self-pay

## 2024-02-06 MED ORDER — DAPAGLIFLOZIN PROPANEDIOL 10 MG PO TABS: ORAL_TABLET | ORAL | 1 refills | Status: AC

## 2024-02-06 NOTE — Telephone Encounter (Signed)
 Copied from CRM #[id number]. Topic: Clinical - Request for Lab/Test Order >> Feb 04, 2024 12:25 PM Franky GRADE wrote: Reason for CRM: Patient is scheduled to see Dr.Panosh on 02/07/2024 but would like to have labs prior to the appointment. No active orders on file.

## 2024-02-06 NOTE — Telephone Encounter (Signed)
 Attempted to reach pt. Left a voicemail to call us back.

## 2024-02-07 ENCOUNTER — Telehealth: Payer: Self-pay | Admitting: *Deleted

## 2024-02-07 ENCOUNTER — Other Ambulatory Visit (HOSPITAL_COMMUNITY): Payer: Self-pay

## 2024-02-07 ENCOUNTER — Ambulatory Visit (INDEPENDENT_AMBULATORY_CARE_PROVIDER_SITE_OTHER): Admitting: Internal Medicine

## 2024-02-07 ENCOUNTER — Encounter: Payer: Self-pay | Admitting: Internal Medicine

## 2024-02-07 VITALS — BP 122/76 | HR 68 | Ht 64.0 in | Wt 184.0 lb

## 2024-02-07 DIAGNOSIS — I10 Essential (primary) hypertension: Secondary | ICD-10-CM

## 2024-02-07 DIAGNOSIS — E876 Hypokalemia: Secondary | ICD-10-CM | POA: Diagnosis not present

## 2024-02-07 DIAGNOSIS — E1165 Type 2 diabetes mellitus with hyperglycemia: Secondary | ICD-10-CM

## 2024-02-07 DIAGNOSIS — D8989 Other specified disorders involving the immune mechanism, not elsewhere classified: Secondary | ICD-10-CM

## 2024-02-07 DIAGNOSIS — Z79899 Other long term (current) drug therapy: Secondary | ICD-10-CM | POA: Diagnosis not present

## 2024-02-07 DIAGNOSIS — L989 Disorder of the skin and subcutaneous tissue, unspecified: Secondary | ICD-10-CM

## 2024-02-07 LAB — CBC WITH DIFFERENTIAL/PLATELET
Basophils Absolute: 0 K/uL (ref 0.0–0.1)
Basophils Relative: 0.7 % (ref 0.0–3.0)
Eosinophils Absolute: 0.1 K/uL (ref 0.0–0.7)
Eosinophils Relative: 2.4 % (ref 0.0–5.0)
HCT: 41.4 % (ref 36.0–46.0)
Hemoglobin: 13.7 g/dL (ref 12.0–15.0)
Lymphocytes Relative: 45.6 % (ref 12.0–46.0)
Lymphs Abs: 2 K/uL (ref 0.7–4.0)
MCHC: 33.1 g/dL (ref 30.0–36.0)
MCV: 86.1 fl (ref 78.0–100.0)
Monocytes Absolute: 0.3 K/uL (ref 0.1–1.0)
Monocytes Relative: 7.8 % (ref 3.0–12.0)
Neutro Abs: 1.9 K/uL (ref 1.4–7.7)
Neutrophils Relative %: 43.5 % (ref 43.0–77.0)
Platelets: 170 K/uL (ref 150.0–400.0)
RBC: 4.8 Mil/uL (ref 3.87–5.11)
RDW: 12.8 % (ref 11.5–15.5)
WBC: 4.3 K/uL (ref 4.0–10.5)

## 2024-02-07 LAB — BASIC METABOLIC PANEL WITH GFR
BUN: 16 mg/dL (ref 6–23)
CO2: 30 meq/L (ref 19–32)
Calcium: 10.1 mg/dL (ref 8.4–10.5)
Chloride: 103 meq/L (ref 96–112)
Creatinine, Ser: 0.73 mg/dL (ref 0.40–1.20)
GFR: 90.22 mL/min (ref >60.00)
Glucose, Bld: 103 mg/dL — ABNORMAL HIGH (ref 70–99)
Potassium: 3.6 meq/L (ref 3.5–5.1)
Sodium: 141 meq/L (ref 135–145)

## 2024-02-07 LAB — HEMOGLOBIN A1C: Hgb A1c MFr Bld: 5.9 % (ref 4.6–6.5)

## 2024-02-07 MED ORDER — CARVEDILOL 12.5 MG PO TABS: ORAL_TABLET | ORAL | 1 refills | Status: AC

## 2024-02-07 MED ORDER — SEMAGLUTIDE (1 MG/DOSE) 4 MG/3ML ~~LOC~~ SOPN: 1.0000 mg | PEN_INJECTOR | SUBCUTANEOUS | 2 refills | Status: DC

## 2024-02-07 NOTE — Telephone Encounter (Signed)
 Copied from CRM #[id number]. Topic: Clinical - Medication Prior Auth >> Feb 07, 2024  3:46 PM Monica Estrada wrote: Reason for CRM: Patient calling because she is needing a prior authorization on her dapagliflozin  propanediol (FARXIGA ) 10 MG TABS tablet and Semaglutide . She wanted to let Dr. Charlett know that she is still on the starting dose for semaglutide .

## 2024-02-07 NOTE — Patient Instructions (Addendum)
 Good to see you today .  If bp consistently high  in am can increase carvedilol  evening dose or 1.5 of the 12.5 mg dose.   Increase ozempic   dosing  as discussed .   Get flu vaccine before end of October .  Plan 3 mos  fu  .

## 2024-02-07 NOTE — Progress Notes (Signed)
 Chief Complaint  Patient presents with   Medication Management    Refills needed Due for Zoster, Pneumococcal Vaccine and Pap Smear    HPI: Monica Estrada 58 y.o. come in for Chronic disease management   DM  no new sx  on 0.5 and up and hasn't noted difference yet  but doing ok no sig se  and farxiga   HT has monitor  tends to be high in am stage 2 ht  138/90+  ok rest of time  on  med combo  Gets pap and check gyne and utd Sees eye doc reg and no changes Skin left foot and r ankle  never goes away has used antifungals and steroids at some poin mitigates but never goes away.  ROS: See pertinent positives and negatives per HPI.  Past Medical History:  Diagnosis Date   Abnormal Pap smear    Asthma    Colonic edema    mild left- ed eval abd ct   Gave birth to child recently    2012   History of hypothyroidism 1999   of post partum   Hx of abnormal cervical Pap smear    Hypertension    RSV (respiratory syncytial virus infection)    with wheezing and asthmatic sx   Uveitis    stable   Wheezing 04/01/2015    Family History  Problem Relation Age of Onset   Hypertension Mother    Diabetes Mother    Thyroid  disease Mother    Diabetes Father    Hypertension Father    Lupus Sister    Rheum arthritis Sister    Other Sister        lamb disease   Stroke Sister    Diabetes Brother    Diabetes Son    Colon cancer Neg Hx    Colon polyps Neg Hx    Esophageal cancer Neg Hx    Rectal cancer Neg Hx    Stomach cancer Neg Hx     Social History   Socioeconomic History   Marital status: Single    Spouse name: Not on file   Number of children: Not on file   Years of education: Not on file   Highest education level: Not on file  Occupational History   Occupation: retail  Tobacco Use   Smoking status: Never   Smokeless tobacco: Never  Vaping Use   Vaping status: Never Used  Substance and Sexual Activity   Alcohol use: No   Drug use: No   Sexual activity: Not  Currently  Other Topics Concern   Not on file  Social History Narrative   [name]  ABOUT 30 HOURS    Single   HH of 3 son  84 y  And baby girl 2 year  Father out of country comes back ocass. Some financial support .    [place]  [place].   No pets.    Neg ets .    2 jobs  hh of 2  81 yo and older teen son college                Social Drivers of Corporate investment banker Strain: Low Risk  (09/05/2022)   Overall Financial Resource Strain (CARDIA)    Difficulty of Paying Living Expenses: Not hard at all  Food Insecurity: No Food Insecurity (09/05/2022)   Hunger Vital Sign    Worried About Running Out of Food in the Last Year: Never true  Ran Out of Food in the Last Year: Never true  Transportation Needs: No Transportation Needs (09/05/2022)   PRAPARE - Administrator, Civil Service (Medical): No    Lack of Transportation (Non-Medical): No  Physical Activity: Sufficiently Active (09/05/2022)   Exercise Vital Sign    Days of Exercise per Week: 5 days    Minutes of Exercise per Session: 60 min  Stress: No Stress Concern Present (09/05/2022)   [employer] of Occupational Health - Occupational Stress Questionnaire    Feeling of Stress : Not at all  Social Connections: Moderately Integrated (09/05/2022)   Social Connection and Isolation Panel    Frequency of Communication with Friends and Family: More than three times a week    Frequency of Social Gatherings with Friends and Family: Once a week    Attends Religious Services: More than 4 times per year    Active Member of [employer] or Organizations: Yes    Attends Engineer, structural: More than 4 times per year    Marital Status: Never married    Outpatient Medications Prior to Visit  Medication Sig Dispense Refill   ACCU-CHEK GUIDE test strip TEST UP TO 4 TIMES DAILY AS DIRECTED 100 strip 1   Accu-Chek Softclix Lancets lancets TEST UP TO 4 TIMES DAILY 100 each 1   adalimumab (HUMIRA, 2 PEN,) 40 MG/0.4ML pen  Inject 0.4 mLs into the skin as directed.     albuterol  (VENTOLIN  HFA) 108 (90 Base) MCG/ACT inhaler Inhale 2 puffs into the lungs every 6 (six) hours as needed for wheezing or shortness of breath. 8 g 2   amLODipine  (NORVASC ) 10 MG tablet TAKE 1 TABLET BY MOUTH EVERY DAY 90 tablet 3   aspirin 81 MG EC tablet Take by mouth.     atropine  1 % ophthalmic solution Place 1 drop into the right eye 2 (two) times daily.     azaTHIOprine  (IMURAN ) 50 MG tablet Take 1 tablet by mouth daily.     blood glucose meter kit and supplies KIT Dispense based on patient and insurance preference. Use up to four times daily as directed. 1 each 0   brimonidine  (ALPHAGAN ) 0.2 % ophthalmic solution Place 1 drop into both eyes every 8 (eight) hours.     chlorhexidine  (PERIDEX ) 0.12 % solution Use as directed 15 mLs in the mouth or throat 2 (two) times daily. Swish in mouth for 30 seconds then spit out twice a day after brushing teeth. Do not eat or drink for at least 30 minutes afterwards 120 mL 0   clobetasol ointment (TEMOVATE) 0.05 % Apply 1 Application topically 2 (two) times daily.     dapagliflozin  propanediol (FARXIGA ) 10 MG TABS tablet TAKE 1 TABLET BY MOUTH EVERY DAY BEFORE BREAKFAST 90 tablet 1   dextromethorphan -guaiFENesin  (MUCINEX  DM) 30-600 MG 12hr tablet Take 1 tablet by mouth daily as needed.     ferrous sulfate 325 (65 FE) MG tablet Take 650 mg by mouth daily with breakfast.     Fluticasone -Salmeterol (ADVAIR DISKUS) 250-50 MCG/DOSE AEPB Inhale 1 puff into the lungs in the morning and at bedtime. (Patient taking differently: Inhale 1 puff into the lungs as needed.) 180 each 3   folic acid (FOLVITE) 1 MG tablet Take 1 tablet by mouth daily.     ibuprofen  (ADVIL ) 800 MG tablet Take 1 tablet (800 mg total) by mouth every 8 (eight) hours as needed (PAIN). 21 tablet 0   ipratropium-albuterol  (DUONEB) 0.5-2.5 (3) MG/3ML SOLN TAKE  3 MLS BY NEBULIZATION EVERY 4 HOURS AS NEEDED (SHORTNESS OF BREATH/WHEEZING). 540 mL 3    latanoprost  (XALATAN ) 0.005 % ophthalmic solution Place 1 drop into both eyes at bedtime.     nystatin ointment (MYCOSTATIN) Apply 1 application  topically in the morning and at bedtime.     potassium chloride  SA (KLOR-CON  M) 20 MEQ tablet TAKE 1 TABLET BY MOUTH EVERY DAY 90 tablet 1   prednisoLONE  acetate (PRED FORTE ) 1 % ophthalmic suspension Place 1 drop into both eyes 2 (two) times daily.     rosuvastatin  (CRESTOR ) 10 MG tablet TAKE 1 TABLET BY MOUTH EVERY DAY 90 tablet 3   tacrolimus (PROGRAF) 1 MG capsule Take 1 mg by mouth 2 (two) times daily.     tiotropium (SPIRIVA  HANDIHALER) 18 MCG inhalation capsule Place 1 capsule (18 mcg total) into inhaler and inhale daily. (Patient taking differently: Place 18 mcg into inhaler and inhale as needed.) 90 capsule 3   triamcinolone  cream (KENALOG ) 0.1 % Apply 1 Application topically 2 (two) times daily. 30 g 1   valACYclovir (VALTREX) 1000 MG tablet Take 1,000 mg by mouth 2 (two) times daily.     valsartan -hydrochlorothiazide  (DIOVAN -HCT) 160-25 MG tablet Take 1 tablet by mouth daily. Change from lsiniopril  hctz 90 tablet 1   carvedilol  (COREG ) 12.5 MG tablet TAKE 0.5 TABLETS (6.25 MG TOTAL) BY MOUTH 2 (TWO) TIMES DAILY WITH A MEAL. AFTER 2 WEEKS CAN INCREASE TO 12.5 TWICE A DAY FOR BP CONTROL . STOP THE CLONIDINE  180 tablet 1   Semaglutide ,0.25 or 0.5MG /DOS, (OZEMPIC , 0.25 OR 0.5 MG/DOSE,) 2 MG/3ML SOPN INJECT 0.25 MG INTO THE SKIN ONCE A WEEK. FOR DIABETES 3 mL 1   Adalimumab 40 MG/0.4ML PNKT Inject into the skin.     No facility-administered medications prior to visit.     EXAM:  BP 122/76 (BP Location: Left Arm, Patient Position: Sitting, Cuff Size: Large)   Pulse 68   Ht 5' 4 (1.626 m)   Wt 184 lb (83.5 kg)   LMP  (LMP Unknown)   SpO2 97%   BMI 31.58 kg/m   Body mass index is 31.58 kg/m. Wt Readings from Last 3 Encounters:  02/07/24 184 lb (83.5 kg)  08/21/23 180 lb (81.6 kg)  06/26/23 180 lb (81.6 kg)    GENERAL: vitals  reviewed and listed above, alert, oriented, appears well hydrated and in no acute distress HEENT: atraumatic, conjunctiva  clear, no obvious abnormalities on inspection of external nose and ears e  NECK: no obvious masses on inspection palpation  LUNGS: clear to auscultation bilaterally, no wheezes, rales or rhonchi, good air movement CV: HRRR, but slow and ocass skipped beat  rate 58  no clubbing cyanosis or  peripheral edema nl cap refill  Skin  round faint pink 4 cm top of left foot  and small area r lateral area . No scaling ? Skin lines  smooth  MS: moves all extremities without noticeable focal  abnormality PSYCH: pleasant and cooperative, no obvious depression or anxiety Lab Results  Component Value Date   WBC 5.6 01/08/2023   HGB 14.8 01/08/2023   HCT 42.9 01/08/2023   PLT 187 01/08/2023   GLUCOSE 106 (H) 07/26/2023   CHOL 150 07/26/2023   TRIG 80.0 07/26/2023   HDL 53.30 07/26/2023   LDLDIRECT 144.5 10/31/2010   LDLCALC 81 07/26/2023   ALT 20 07/26/2023   AST 21 07/26/2023   NA 140 07/26/2023   K 3.4 (L) 07/26/2023   CL  102 07/26/2023   CREATININE 0.77 07/26/2023   BUN 17 07/26/2023   CO2 31 07/26/2023   TSH 3.35 07/26/2023   HGBA1C 6.0 07/26/2023   MICROALBUR <0.7 07/26/2023   BP Readings from Last 3 Encounters:  02/07/24 122/76  08/21/23 125/73  08/15/23 108/71  Repeated bp with monitor  120/80?  But faint and her monitor 140/90+   not sure of correleation except she has ocass skipped beat and may effect readings   ASSESSMENT AND PLAN:  Discussed the following assessment and plan:  Type 2 diabetes mellitus with hyperglycemia, without long-term current use of insulin  (HCC) - Plan: Basic metabolic panel with GFR, Hemoglobin A1c, CBC with Differential/Platelet, Basic metabolic panel with GFR  Medication management - Plan: Basic metabolic panel with GFR, Hemoglobin A1c, CBC with Differential/Platelet, Basic metabolic panel with GFR  Essential hypertension - Plan:  Basic metabolic panel with GFR, Hemoglobin A1c, CBC with Differential/Platelet, Basic metabolic panel with GFR  Low blood potassium - Plan: Basic metabolic panel with GFR, Hemoglobin A1c, CBC with Differential/Platelet, Basic metabolic panel with GFR  Skin lesion - Plan: Ambulatory referral to Dermatology  Autoimmune disorder - Plan: Ambulatory referral to [facility] ozempic  to 1 mg weekly would prefer better weight control as well as dm control  Update lab today include  potass and A1c BP  control can in pn dose to 1.5 of 12.5  carvedliol  Derm check on  skin lesions  ? If autoimmune other . Plan 3 mos appt  or as indicated  Can get vaccines as discussed   -Patient advised to return or notify health care team  if  new concerns arise.  Patient Instructions  Good to see you today .  If bp consistently high  in am can increase carvedilol  evening dose or 1.5 of the 12.5 mg dose.   Increase ozempic   dosing  as discussed .   Get flu vaccine before end of October .  Plan 3 mos  fu  .      Solimar Maiden K. Achol Azpeitia M.D.

## 2024-02-08 ENCOUNTER — Telehealth: Payer: Self-pay

## 2024-02-08 ENCOUNTER — Other Ambulatory Visit (HOSPITAL_COMMUNITY): Payer: Self-pay

## 2024-02-08 NOTE — Telephone Encounter (Signed)
 Pharmacy Patient Advocate Encounter   Received notification from Onbase that prior authorization for Ozempic  1 is required/requested.   Insurance verification completed.   The patient is insured through [facility] MEDICAID .   Per test claim: PA required; PA submitted to above mentioned insurance via Latent Key/confirmation #/EOC [id number] Status is pending

## 2024-02-08 NOTE — Telephone Encounter (Signed)
 Pharmacy Patient Advocate Encounter  Received notification from [facility] MEDICAID that Prior Authorization for Ozempic  1 has been APPROVED from 01/25/24 to 02/07/25   PA #/Case ID/Reference #: [id number]  Test billing shows last filled 12/31/23.

## 2024-02-11 ENCOUNTER — Telehealth: Payer: Self-pay

## 2024-02-11 ENCOUNTER — Telehealth: Payer: Self-pay | Admitting: *Deleted

## 2024-02-11 ENCOUNTER — Other Ambulatory Visit (HOSPITAL_COMMUNITY): Payer: Self-pay

## 2024-02-11 NOTE — Telephone Encounter (Signed)
 Pharmacy Patient Advocate Encounter  Received notification from [facility] MEDICAID that Prior Authorization for Farxiga  10 has been APPROVED from 01/28/24 to 02/10/25. Ran test claim, Copay is $4.00 for a 90 day supply. This test claim was processed through [facility]- copay amounts may vary at other pharmacies due to pharmacy/plan contracts, or as the patient moves through the different stages of their insurance plan.   PA #/Case ID/Reference #: # [id number]

## 2024-02-11 NOTE — Telephone Encounter (Signed)
 Pharmacy Patient Advocate Encounter   Received notification from Pt Calls Messages that prior authorization for Farxiga  10 is required/requested.   Insurance verification completed.   The patient is insured through [facility] MEDICAID .   Per test claim: PA required; PA submitted to above mentioned insurance via Latent Key/confirmation #/EOC [id number] Status is pending

## 2024-02-11 NOTE — Telephone Encounter (Unsigned)
 Copied from CRM [id number]. Topic: Clinical - Prescription Issue >> Feb 07, 2024  3:24 PM Berneda FALCON wrote: Reason for CRM: [facility] an [facility] with the   dapagliflozin  propanediol (FARXIGA ) 10 MG TABS tablet Semaglutide , 1 MG/DOSE, 4 MG/3ML SOPN  [id number] [address], [place] - [address] [place] [address] Phone: [phone] Fax: [phone] Hours: Not open 24 hours  Patient callback is [phone] (home)

## 2024-02-11 NOTE — Telephone Encounter (Signed)
 Copied from CRM #[id number]. Topic: Clinical - Medication Prior Auth >> Feb 11, 2024  4:33 PM Amy B wrote: Reason for CRM: Patient states her Rx for Farxiga  requires prior authorization for a 90-day supply.  The previous auth expired today.

## 2024-02-12 ENCOUNTER — Ambulatory Visit: Payer: Self-pay | Admitting: Internal Medicine

## 2024-02-12 NOTE — Telephone Encounter (Signed)
 PA approved. Pt picked it up today.

## 2024-02-12 NOTE — Progress Notes (Signed)
 Results are  in range of stable  A1c is 5.9  OK  kidney function and blood count normal

## 2024-02-12 NOTE — Telephone Encounter (Signed)
 Pt updates she got a notification from her pharmacy that Ozempic  and Farxiga  went through.   Pt has a question about her lab result. Inform her provider has not review it yet. Will relay message to Dr. Charlett. Pt verbalized understanding.   Please advise.

## 2024-02-12 NOTE — Telephone Encounter (Signed)
 Pt is aware. Please see other encounter.

## 2024-02-12 NOTE — Telephone Encounter (Signed)
 Please see other encounter.

## 2024-02-12 NOTE — Telephone Encounter (Signed)
 Spoke to pt. Pt reports she received a notification from her pharmacy that both Rx went through. Follow up with pharmacy, she inform that pt pick up Farxiga  today and Ozempic  on 02/09/2024.

## 2024-02-26 ENCOUNTER — Other Ambulatory Visit: Payer: Self-pay | Admitting: Internal Medicine

## 2024-02-26 ENCOUNTER — Other Ambulatory Visit: Payer: Self-pay | Admitting: Family

## 2024-02-26 NOTE — Telephone Encounter (Signed)
 Copied from CRM #[id number]. Topic: Clinical - Medication Refill >> Feb 26, 2024  3:40 PM [place] M wrote: Medication: ACCU-CHEK GUIDE test strip ;  valsartan -hydrochlorothiazide  (DIOVAN -HCT) 160-25 MG tablet  Has the patient contacted their pharmacy? Yes (Agent: If no, request that the patient contact the pharmacy for the refill. If patient does not wish to contact the pharmacy document the reason why and proceed with request.) (Agent: If yes, when and what did the pharmacy advise?)  This is the patient's preferred pharmacy:  [id number] [address], [place] - [address] [place] [address] Phone: [phone] Fax: [phone]  Is this the correct pharmacy for this prescription? Yes If no, delete pharmacy and type the correct one.   Has the prescription been filled recently? Yes  Is the patient out of the medication? Yes  Has the patient been seen for an appointment in the last year OR does the patient have an upcoming appointment? Yes  Can we respond through MyChart? Yes  Agent: Please be advised that Rx refills may take up to 3 business days. We ask that you follow-up with your pharmacy.

## 2024-02-26 NOTE — Telephone Encounter (Signed)
 New order required for accu-chek guide test strip

## 2024-02-28 ENCOUNTER — Telehealth: Payer: Self-pay | Admitting: *Deleted

## 2024-02-28 NOTE — Telephone Encounter (Signed)
 Copied from CRM #[id number]. Topic: Clinical - Prescription Issue >> Feb 28, 2024 11:32 AM Carlyon D wrote: Reason for CRM: Pt calling in regards to prescription. She states pharmacy states it needs a authorization. Please call pt in regards to valsartan -hydrochlorothiazide  (DIOVAN -HCT) 160-25 MG tablet.

## 2024-03-04 ENCOUNTER — Other Ambulatory Visit: Payer: Self-pay

## 2024-03-04 MED ORDER — POTASSIUM CHLORIDE CRYS ER 20 MEQ PO TBCR: 20.0000 meq | EXTENDED_RELEASE_TABLET | Freq: Every day | ORAL | 1 refills | Status: AC

## 2024-03-04 MED ORDER — ACCU-CHEK GUIDE W/DEVICE KIT: PACK | 0 refills | Status: AC

## 2024-03-04 MED ORDER — BLOOD GLUCOSE MONITOR KIT: PACK | 0 refills | Status: DC

## 2024-03-04 MED ORDER — BLOOD GLUCOSE MONITOR KIT: PACK | 0 refills | Status: AC

## 2024-03-04 MED ORDER — ACCU-CHEK GUIDE TEST VI STRP: ORAL_STRIP | 1 refills | Status: AC

## 2024-03-05 NOTE — Telephone Encounter (Signed)
 Spoke to pt. Pt states she needs Rx send in for potassium, accu-chek test strip and new Blood glucose monitor. Rx was sent and follow up with pharmacy. She states they received the order and pt had pick up her valsartan - hydrochlorothiazide .  No further action is needed at this time.

## 2024-04-12 ENCOUNTER — Ambulatory Visit: Admission: EM | Admit: 2024-04-12 | Discharge: 2024-04-12 | Disposition: A | Discharge status: Home / Self Care

## 2024-04-12 ENCOUNTER — Encounter: Payer: Self-pay | Admitting: *Deleted

## 2024-04-12 ENCOUNTER — Ambulatory Visit (INDEPENDENT_AMBULATORY_CARE_PROVIDER_SITE_OTHER)

## 2024-04-12 DIAGNOSIS — M25551 Pain in right hip: Secondary | ICD-10-CM | POA: Diagnosis not present

## 2024-04-12 DIAGNOSIS — S76011A Strain of muscle, fascia and tendon of right hip, initial encounter: Secondary | ICD-10-CM

## 2024-04-12 HISTORY — DX: Unspecified glaucoma: H40.9

## 2024-04-12 MED ORDER — KETOROLAC TROMETHAMINE 30 MG/ML IJ SOLN
30.0000 mg | Freq: Once | INTRAMUSCULAR | Status: AC
  Administered 2024-04-12: 30 mg via INTRAMUSCULAR

## 2024-04-12 MED ORDER — DICLOFENAC SODIUM 75 MG PO TBEC: 75.0000 mg | DELAYED_RELEASE_TABLET | Freq: Two times a day (BID) | ORAL | 0 refills | Status: AC

## 2024-04-12 NOTE — ED Triage Notes (Signed)
 Pt reports right leg/hip pain, worse when she lifts her leg and walks. Pain started 3 days ago and has been worsening. States she works out and walks everyday. Denies fall/denies known injury. She took ibuprofen  800mg  one night but it didn't help. Able to ambulate to room with steady gait, slight limp

## 2024-04-12 NOTE — Discharge Instructions (Addendum)
 Preliminary overview hip x-ray looks normal.  If radiologist is something different I will call you and let you know.  Attached are hip exercises to help with your symptoms.  Will prescribe nonsteroidal anti-inflammatory drug to help with your pain.  Today you have been diagnosed with a musculoskeletal injury. You should use ice on affected area for 20 minutes at a time a couple times a day for the first 24 hours then you may switch to heat in the same intervals.  Be sure to put a barrier between ice or heat source and skin to prevent burns.  May also wrap affected area and Ace bandage if tolerated and appropriate, and elevate above the level of the heart to help reduce swelling.  Do not wrap Ace bandages around neck or torso as wrapping too tight can restrict air movement inability to breathe.  If symptoms do not seem to be improving in 3 to 5 days after following these instructions we need to follow-up with orthopedist or PCP.

## 2024-04-12 NOTE — ED Provider Notes (Signed)
 [name]    CSN: [id number] Arrival date & time: 04/12/24  9164      History   Chief Complaint Chief Complaint  Patient presents with   Leg Pain    HPI Monica Estrada is a 58 y.o. female.   Patient presents today due to 10 out of 10 right hip pain for the past 3 days.  Patient states that she does a lot of walking and exercise but denies stretching after exercise.  Patient states that she took 800 mg ibuprofen  once last night with no significant relief.  Patient has good range of motion and is able to internally and externally rotate right hip with no issue.  The history is provided by the patient.  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   Hypertensive retinopathy of both eyes 12/07/2015   Nuclear sclerotic cataract of both eyes 09/28/2015   Posterior synechiae (iris), 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   Wheezing 04/01/2015   Essential hypertension 04/17/2014   TSH elevation 04/17/2014   Medication management 04/17/2014   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   Cystoid macular edema, right 01/31/2013   Uveitis, intermediate, right 01/31/2013   History of hypokalemia 11/26/2012   Tingling 11/26/2012   BMI 32.0-32.9,adult 01/08/2012   Umbilical hernia  possible from lap site  12/05/2011   Preventative health care 11/12/2010   Uveitis    History of hypothyroidism     NONSPECIFIC ABN FINDING RAD & OTH EXAM GI TRACT 11/25/2009   Anemia 11/03/2009   AUTOIMMUNE THYROIDITIS 11/20/2008   UVEITIS 12/12/2006   Hyperlipemia 12/11/2006    Past Surgical History:  Procedure Laterality Date   CHOLECYSTECTOMY     1999   COLONOSCOPY     TUBAL LIGATION  12/31/2010   Procedure: BILATERAL TUBAL LIGATION;  Surgeon: Krystal BIRCH Meisinger;  Location: [facility];  Service: Gynecology;  Laterality: Bilateral;  Bilateral post partum tubal ligation    OB History     Gravida  2   Para  2   Term  2   Preterm  0   AB  0   Living  2      SAB  0   IAB  0   Ectopic  0   Multiple  0   Live Births  1            Home Medications    Prior to Admission medications   Medication Sig Start Date End Date Taking? Authorizing Provider  adalimumab (HUMIRA, 2 PEN,) 40 MG/0.4ML pen Inject 0.4 mLs into the skin as directed. 07/11/22 05/21/24 Yes [provider]  albuterol  (VENTOLIN  HFA) 108 (90 Base) MCG/ACT inhaler Inhale 2 puffs into the lungs every 6 (six) hours as needed for wheezing  or shortness of breath. 08/07/22  Yes Flint Raring K, PA-C  amLODipine  (NORVASC ) 10 MG tablet TAKE 1 TABLET BY MOUTH EVERY DAY 11/27/23  Yes Panosh, Wanda K, MD  aspirin 81 MG EC tablet Take by mouth.   Yes [provider]  atropine  1 % ophthalmic solution Place 1 drop into the right eye 2 (two) times daily.   Yes [provider]  azaTHIOprine  (IMURAN ) 50 MG tablet Take 1 tablet by mouth daily. 07/11/22  Yes [provider]  brimonidine  (ALPHAGAN ) 0.2 % ophthalmic solution Place 1 drop into both eyes every 8 (eight) hours. 05/13/20  Yes [provider]  carvedilol  (COREG ) 12.5 MG tablet 12.5  mg 1 po tab  in am and 18.75 mg 1.5 tab in pm as directed 02/07/24  Yes Panosh, Wanda K, MD  clobetasol ointment (TEMOVATE) 0.05 % Apply 1 Application topically 2 (two) times daily. 05/21/23  Yes [provider]  dapagliflozin  propanediol (FARXIGA ) 10 MG  TABS tablet TAKE 1 TABLET BY MOUTH EVERY DAY BEFORE BREAKFAST 02/06/24  Yes Panosh, Wanda K, MD  diclofenac (VOLTAREN) 75 MG EC tablet Take 1 tablet (75 mg total) by mouth 2 (two) times daily. 04/12/24  Yes Andra Krabbe C, PA-C  dorzolamide -timolol  (COSOPT ) 2-0.5 % ophthalmic solution 1 drop 2 (two) times daily.   Yes [provider]  ferrous sulfate 325 (65 FE) MG tablet Take 650 mg by mouth daily with breakfast.   Yes [provider]  Fluticasone -Salmeterol (ADVAIR DISKUS) 250-50 MCG/DOSE AEPB Inhale 1 puff into the lungs in the morning and at bedtime. Patient taking differently: Inhale 1 puff into the lungs as needed. 05/25/20 04/12/24 Yes [place], Camellia PARAS, DO  folic acid (FOLVITE) 1 MG tablet Take 1 tablet by mouth daily. 11/19/18  Yes [provider]  ibuprofen  (ADVIL ) 800 MG tablet Take 1 tablet (800 mg total) by mouth every 8 (eight) hours as needed (PAIN). 08/15/23  Yes Murrill, Lucie, FNP  ipratropium-albuterol  (DUONEB) 0.5-2.5 (3) MG/3ML SOLN TAKE 3 MLS BY NEBULIZATION EVERY 4 HOURS AS NEEDED (SHORTNESS OF BREATH/WHEEZING). 11/27/23  Yes Panosh, Wanda K, MD  nystatin ointment (MYCOSTATIN) Apply 1 application  topically in the morning and at bedtime. 05/12/20  Yes [provider]  potassium chloride  SA (KLOR-CON  M) 20 MEQ tablet Take 1 tablet (20 mEq total) by mouth daily. 03/04/24  Yes Panosh, Apolinar POUR, MD  rosuvastatin  (CRESTOR ) 10 MG tablet TAKE 1 TABLET BY MOUTH EVERY DAY 11/27/23  Yes Panosh, Wanda K, MD  Semaglutide , 1 MG/DOSE, 4 MG/3ML SOPN Inject 1 mg as directed once a week. Dosage increase 02/07/24  Yes Panosh, Wanda K, MD  tacrolimus (PROGRAF) 1 MG capsule Take 1 mg by mouth 2 (two) times daily. 07/11/22  Yes [provider]  triamcinolone  cream (KENALOG ) 0.1 % Apply 1 Application topically 2 (two) times daily. 12/11/22  Yes Burchette, Wolm ORN, MD  valACYclovir (VALTREX) 1000 MG tablet Take 1,000 mg by mouth 2 (two) times daily. 07/11/22   Yes [provider]  valsartan -hydrochlorothiazide  (DIOVAN -HCT) 160-25 MG tablet TAKE 1 TABLET BY MOUTH DAILY. CHANGE FROM LSINIOPRIL HCTZ 02/29/24  Yes Panosh, Wanda K, MD  ACCU-CHEK GUIDE test strip TEST UP TO 4 TIMES DAILY AS DIRECTED 10/28/22   Webb, Padonda B, FNP  Accu-Chek Softclix Lancets lancets TEST UP TO 4 TIMES DAILY 01/04/22   Panosh, Wanda K, MD  blood glucose meter kit and supplies KIT Dispense based on patient and insurance preference. Use up to four times daily as  directed. 03/04/24   Panosh, Wanda K, MD  Blood Glucose Monitoring Suppl (ACCU-CHEK GUIDE) w/Device KIT Test up to 4 times daily as directed 03/04/24   Panosh, Wanda K, MD  chlorhexidine  (PERIDEX ) 0.12 % solution Use as directed 15 mLs in the mouth or throat 2 (two) times daily. Swish in mouth for 30 seconds then spit out twice a day after brushing teeth. Do not eat or drink for at least 30 minutes afterwards Patient not taking: Reported on 04/12/2024 08/15/23   Murrill, Samantha, FNP  dextromethorphan -guaiFENesin  (MUCINEX  DM) 30-600 MG 12hr tablet Take 1 tablet by mouth daily as needed. Patient not taking: Reported on 04/12/2024 05/25/20   [provider]  glucose blood (ACCU-CHEK GUIDE TEST) test strip Use as instructed 03/04/24   Panosh, Wanda K, MD  latanoprost  (XALATAN ) 0.005 % ophthalmic solution Place 1 drop into both eyes at bedtime. Patient not taking: Reported on 04/12/2024 03/22/20   [provider]  prednisoLONE  acetate (PRED FORTE ) 1 % ophthalmic suspension Place 1 drop into both eyes 2 (two) times daily. Patient not taking: Reported on 04/12/2024    [provider]  tiotropium (SPIRIVA  HANDIHALER) 18 MCG inhalation capsule Place 1 capsule (18 mcg total) into inhaler and inhale daily. Patient taking differently: Place 18 mcg into inhaler and inhale as needed. 05/25/20 02/07/24  [place], Eric J, DO    Family History Family History  Problem Relation Age of Onset   Hypertension  Mother    Diabetes Mother    Thyroid  disease Mother    Diabetes Father    Hypertension Father    Lupus Sister    Rheum arthritis Sister    Other Sister        lamb disease   Stroke Sister    Diabetes Brother    Diabetes Son    Colon cancer Neg Hx    Colon polyps Neg Hx    Esophageal cancer Neg Hx    Rectal cancer Neg Hx    Stomach cancer Neg Hx     Social History Social History   Tobacco Use   Smoking status: Never   Smokeless tobacco: Never  Vaping Use   Vaping status: Never Used  Substance Use Topics   Alcohol use: No   Drug use: No     Allergies   Patient has no known allergies.   Review of Systems Review of Systems   Physical Exam Triage Vital Signs ED Triage Vitals  Encounter Vitals Group     BP 04/12/24 0914 113/75     Girls Systolic BP Percentile --      Girls Diastolic BP Percentile --      Boys Systolic BP Percentile --      Boys Diastolic BP Percentile --      Pulse Rate 04/12/24 0914 82     Resp 04/12/24 0914 18     Temp 04/12/24 0914 98.8 F (37.1 C)     Temp Source 04/12/24 0914 Oral     SpO2 04/12/24 0914 94 %     Weight --      Height --      Head Circumference --      Peak Flow --      Pain Score 04/12/24 0909 9     Pain Loc --      Pain Education --      Exclude from Growth Chart --    No data found.  Updated Vital Signs BP 113/75 (BP Location: Right Arm)  Pulse 82   Temp 98.8 F (37.1 C) (Oral)   Resp 18   LMP  (LMP Unknown)   SpO2 94%   Visual Acuity Right Eye Distance:   Left Eye Distance:   Bilateral Distance:    Right Eye Near:   Left Eye Near:    Bilateral Near:     Physical Exam Vitals and nursing note reviewed.  Constitutional:      General: She is not in acute distress.    Appearance: Normal appearance. She is not ill-appearing, toxic-appearing or diaphoretic.  Eyes:     General: No scleral icterus. Cardiovascular:     Rate and Rhythm: Normal rate and regular rhythm.     Heart sounds: Normal heart  sounds.  Pulmonary:     Effort: Pulmonary effort is normal. No respiratory distress.     Breath sounds: Normal breath sounds. No wheezing or rhonchi.  Musculoskeletal:     Right hip: Tenderness present. No deformity, lacerations, bony tenderness or crepitus. Normal range of motion. Normal strength.       Legs:  Skin:    General: Skin is warm.  Neurological:     Mental Status: She is alert and oriented to person, place, and time.  Psychiatric:        Mood and Affect: Mood normal.        Behavior: Behavior normal.      UC Treatments / Results  Labs (all labs ordered are listed, but only abnormal results are displayed) Labs Reviewed - No data to display  EKG   Radiology No results found.  Procedures Procedures (including critical care time)  Medications Ordered in UC Medications  ketorolac  (TORADOL ) 30 MG/ML injection 30 mg (30 mg Intramuscular Given 04/12/24 1017)    Initial Impression / Assessment and Plan / UC Course  I have reviewed the triage vital signs and the nursing notes.  Pertinent labs & imaging results that were available during my care of the patient were reviewed by me and considered in my medical decision making (see chart for details).    Final Clinical Impressions(s) / UC Diagnoses   Final diagnoses:  Pain in right hip  Strain of muscle of right hip, initial encounter     Discharge Instructions      Preliminary overview hip x-ray looks normal.  If radiologist is something different I will call you and let you know.  Attached are hip exercises to help with your symptoms.  Will prescribe nonsteroidal anti-inflammatory drug to help with your pain.  Today you have been diagnosed with a musculoskeletal injury. You should use ice on affected area for 20 minutes at a time a couple times a day for the first 24 hours then you may switch to heat in the same intervals.  Be sure to put a barrier between ice or heat source and skin to prevent burns.  May  also wrap affected area and Ace bandage if tolerated and appropriate, and elevate above the level of the heart to help reduce swelling.  Do not wrap Ace bandages around neck or torso as wrapping too tight can restrict air movement inability to breathe.  If symptoms do not seem to be improving in 3 to 5 days after following these instructions we need to follow-up with orthopedist or PCP.      ED Prescriptions     Medication Sig Dispense Auth. Provider   diclofenac (VOLTAREN) 75 MG EC tablet Take 1 tablet (75 mg total) by mouth 2 (two) times  daily. 30 tablet Andra Corean BROCKS, PA-C      PDMP not reviewed this encounter.   Andra Corean BROCKS, PA-C 04/12/24 1046

## 2024-05-06 ENCOUNTER — Other Ambulatory Visit: Payer: Self-pay | Admitting: Internal Medicine

## 2024-05-14 ENCOUNTER — Ambulatory Visit: Admitting: Internal Medicine

## 2024-05-14 NOTE — Progress Notes (Deleted)
 "  No chief complaint on file.   HPI: Monica Estrada 58 y.o. come in for Chronic disease management   Bp    DM  ROS: See pertinent positives and negatives per HPI.  Past Medical History:  Diagnosis Date   Abnormal Pap smear    Asthma    Colonic edema    mild left- ed eval abd ct   Gave birth to child recently    2012   Glaucoma    History of hypothyroidism 1999   of post partum   Hx of abnormal cervical Pap smear    Hypertension    RSV (respiratory syncytial virus infection)    with wheezing and asthmatic sx   Uveitis    stable   Wheezing 04/01/2015    Family History  Problem Relation Age of Onset   Hypertension Mother    Diabetes Mother    Thyroid  disease Mother    Diabetes Father    Hypertension Father    Lupus Sister    Rheum arthritis Sister    Other Sister        lamb disease   Stroke Sister    Diabetes Brother    Diabetes Son    Colon cancer Neg Hx    Colon polyps Neg Hx    Esophageal cancer Neg Hx    Rectal cancer Neg Hx    Stomach cancer Neg Hx     Social History   Socioeconomic History   Marital status: Single    Spouse name: Not on file   Number of children: Not on file   Years of education: Not on file   Highest education level: Not on file  Occupational History   Occupation: retail  Tobacco Use   Smoking status: Never   Smokeless tobacco: Never  Vaping Use   Vaping status: Never Used  Substance and Sexual Activity   Alcohol use: No   Drug use: No   Sexual activity: Not Currently  Other Topics Concern   Not on file  Social History Narrative   [name]  ABOUT 30 HOURS    Single   HH of 3 son  49 y  And baby girl 2 year  Father out of country comes back ocass. Some financial support .    Father  [place].   No pets.    Neg ets .    2 jobs  hh of 2  70 yo and older teen son college                Social Drivers of Health   Tobacco Use: Low Risk (04/12/2024)   Patient History    Smoking Tobacco Use: Never    Smokeless  Tobacco Use: Never    Passive Exposure: Not on file  Financial Resource Strain: Low Risk (09/05/2022)   Overall Financial Resource Strain (CARDIA)    Difficulty of Paying Living Expenses: Not hard at all  Food Insecurity: No Food Insecurity (09/05/2022)   Hunger Vital Sign    Worried About Running Out of Food in the Last Year: Never true    Ran Out of Food in the Last Year: Never true  Transportation Needs: No Transportation Needs (09/05/2022)   PRAPARE - Administrator, Civil Service (Medical): No    Lack of Transportation (Non-Medical): No  Physical Activity: Sufficiently Active (09/05/2022)   Exercise Vital Sign    Days of Exercise per Week: 5 days    Minutes of Exercise per Session:  60 min  Stress: No Stress Concern Present (09/05/2022)   [employer] of Occupational Health - Occupational Stress Questionnaire    Feeling of Stress : Not at all  Social Connections: Moderately Integrated (09/05/2022)   Social Connection and Isolation Panel    Frequency of Communication with Friends and Family: More than three times a week    Frequency of Social Gatherings with Friends and Family: Once a week    Attends Religious Services: More than 4 times per year    Active Member of Clubs or Organizations: Yes    Attends Banker Meetings: More than 4 times per year    Marital Status: Never married  Depression (PHQ2-9): Low Risk (09/05/2022)   Depression (PHQ2-9)    PHQ-2 Score: 0  Alcohol Screen: Low Risk (09/05/2022)   Alcohol Screen    Last Alcohol Screening Score (AUDIT): 0  Housing: Low Risk (09/05/2022)   Housing    Last Housing Risk Score: 0  Utilities: Not At Risk (09/05/2022)   AHC Utilities    Threatened with loss of utilities: No  Health Literacy: Not on file    Outpatient Medications Prior to Visit  Medication Sig Dispense Refill   ACCU-CHEK GUIDE test strip TEST UP TO 4 TIMES DAILY AS DIRECTED 100 strip 1   Accu-Chek Softclix Lancets lancets TEST UP TO 4  TIMES DAILY 100 each 1   adalimumab (HUMIRA, 2 PEN,) 40 MG/0.4ML pen Inject 0.4 mLs into the skin as directed.     albuterol  (VENTOLIN  HFA) 108 (90 Base) MCG/ACT inhaler Inhale 2 puffs into the lungs every 6 (six) hours as needed for wheezing or shortness of breath. 8 g 2   amLODipine  (NORVASC ) 10 MG tablet TAKE 1 TABLET BY MOUTH EVERY DAY 90 tablet 3   aspirin 81 MG EC tablet Take by mouth.     atropine  1 % ophthalmic solution Place 1 drop into the right eye 2 (two) times daily.     azaTHIOprine  (IMURAN ) 50 MG tablet Take 1 tablet by mouth daily.     blood glucose meter kit and supplies KIT Dispense based on patient and insurance preference. Use up to four times daily as directed. 1 each 0   Blood Glucose Monitoring Suppl (ACCU-CHEK GUIDE) w/Device KIT Test up to 4 times daily as directed 1 kit 0   brimonidine  (ALPHAGAN ) 0.2 % ophthalmic solution Place 1 drop into both eyes every 8 (eight) hours.     carvedilol  (COREG ) 12.5 MG tablet 12.5  mg 1 po tab  in am and 18.75 mg 1.5 tab in pm as directed 180 tablet 1   chlorhexidine  (PERIDEX ) 0.12 % solution Use as directed 15 mLs in the mouth or throat 2 (two) times daily. Swish in mouth for 30 seconds then spit out twice a day after brushing teeth. Do not eat or drink for at least 30 minutes afterwards (Patient not taking: Reported on 04/12/2024) 120 mL 0   clobetasol ointment (TEMOVATE) 0.05 % Apply 1 Application topically 2 (two) times daily.     dapagliflozin  propanediol (FARXIGA ) 10 MG TABS tablet TAKE 1 TABLET BY MOUTH EVERY DAY BEFORE BREAKFAST 90 tablet 1   dextromethorphan -guaiFENesin  (MUCINEX  DM) 30-600 MG 12hr tablet Take 1 tablet by mouth daily as needed. (Patient not taking: Reported on 04/12/2024)     diclofenac  (VOLTAREN ) 75 MG EC tablet Take 1 tablet (75 mg total) by mouth 2 (two) times daily. 30 tablet 0   dorzolamide -timolol  (COSOPT ) 2-0.5 % ophthalmic solution 1 drop  2 (two) times daily.     ferrous sulfate 325 (65 FE) MG tablet Take  650 mg by mouth daily with breakfast.     Fluticasone -Salmeterol (ADVAIR DISKUS) 250-50 MCG/DOSE AEPB Inhale 1 puff into the lungs in the morning and at bedtime. (Patient taking differently: Inhale 1 puff into the lungs as needed.) 180 each 3   folic acid (FOLVITE) 1 MG tablet Take 1 tablet by mouth daily.     glucose blood (ACCU-CHEK GUIDE TEST) test strip Use as instructed 100 each 1   ibuprofen  (ADVIL ) 800 MG tablet Take 1 tablet (800 mg total) by mouth every 8 (eight) hours as needed (PAIN). 21 tablet 0   ipratropium-albuterol  (DUONEB) 0.5-2.5 (3) MG/3ML SOLN TAKE 3 MLS BY NEBULIZATION EVERY 4 HOURS AS NEEDED (SHORTNESS OF BREATH/WHEEZING). 540 mL 3   latanoprost  (XALATAN ) 0.005 % ophthalmic solution Place 1 drop into both eyes at bedtime. (Patient not taking: Reported on 04/12/2024)     nystatin ointment (MYCOSTATIN) Apply 1 application  topically in the morning and at bedtime.     potassium chloride  SA (KLOR-CON  M) 20 MEQ tablet Take 1 tablet (20 mEq total) by mouth daily. 90 tablet 1   prednisoLONE  acetate (PRED FORTE ) 1 % ophthalmic suspension Place 1 drop into both eyes 2 (two) times daily. (Patient not taking: Reported on 04/12/2024)     rosuvastatin  (CRESTOR ) 10 MG tablet TAKE 1 TABLET BY MOUTH EVERY DAY 90 tablet 3   Semaglutide , 1 MG/DOSE, (OZEMPIC , 1 MG/DOSE,) 4 MG/3ML SOPN INJECT 1 MG AS DIRECTED ONCE A WEEK. DOSAGE INCREASE 3 mL 0   tacrolimus (PROGRAF) 1 MG capsule Take 1 mg by mouth 2 (two) times daily.     tiotropium (SPIRIVA  HANDIHALER) 18 MCG inhalation capsule Place 1 capsule (18 mcg total) into inhaler and inhale daily. (Patient taking differently: Place 18 mcg into inhaler and inhale as needed.) 90 capsule 3   triamcinolone  cream (KENALOG ) 0.1 % Apply 1 Application topically 2 (two) times daily. 30 g 1   valACYclovir (VALTREX) 1000 MG tablet Take 1,000 mg by mouth 2 (two) times daily.     valsartan -hydrochlorothiazide  (DIOVAN -HCT) 160-25 MG tablet TAKE 1 TABLET BY MOUTH DAILY.  CHANGE FROM LSINIOPRIL HCTZ 90 tablet 1   No facility-administered medications prior to visit.     EXAM:  LMP  (LMP Unknown)   There is no height or weight on file to calculate BMI.  GENERAL: vitals reviewed and listed above, alert, oriented, appears well hydrated and in no acute distress HEENT: atraumatic, conjunctiva  clear, no obvious abnormalities on inspection of external nose and ears OP : no lesion edema or exudate  NECK: no obvious masses on inspection palpation  LUNGS: clear to auscultation bilaterally, no wheezes, rales or rhonchi, good air movement CV: HRRR, no clubbing cyanosis or  peripheral edema nl cap refill  MS: moves all extremities without noticeable focal  abnormality PSYCH: pleasant and cooperative, no obvious depression or anxiety Lab Results  Component Value Date   WBC 4.3 02/07/2024   HGB 13.7 02/07/2024   HCT 41.4 02/07/2024   PLT 170.0 02/07/2024   GLUCOSE 103 (H) 02/07/2024   CHOL 150 07/26/2023   TRIG 80.0 07/26/2023   HDL 53.30 07/26/2023   LDLDIRECT 144.5 10/31/2010   LDLCALC 81 07/26/2023   ALT 20 07/26/2023   AST 21 07/26/2023   NA 141 02/07/2024   K 3.6 02/07/2024   CL 103 02/07/2024   CREATININE 0.73 02/07/2024   BUN 16 02/07/2024  CO2 30 02/07/2024   TSH 3.35 07/26/2023   HGBA1C 5.9 02/07/2024   MICROALBUR <0.7 07/26/2023   BP Readings from Last 3 Encounters:  04/12/24 113/75  02/07/24 122/76  08/21/23 125/73    ASSESSMENT AND PLAN:  Discussed the following assessment and plan:  No diagnosis found.  -Patient advised to return or notify health care team  if  new concerns arise.  There are no Patient Instructions on file for this visit.   Azuree Minish K. Jemina Scahill M.D. "

## 2024-05-30 ENCOUNTER — Other Ambulatory Visit: Payer: Self-pay | Admitting: Internal Medicine

## 2024-06-03 NOTE — Telephone Encounter (Signed)
 Contacted pt and left detail message stating for pt to contact our office to set up a follow up appt. Will send a month supply.

## 2024-06-07 ENCOUNTER — Ambulatory Visit: Admission: EM | Admit: 2024-06-07 | Discharge: 2024-06-07 | Disposition: A | Discharge status: Home / Self Care

## 2024-06-07 ENCOUNTER — Ambulatory Visit (INDEPENDENT_AMBULATORY_CARE_PROVIDER_SITE_OTHER)

## 2024-06-07 ENCOUNTER — Encounter: Payer: Self-pay | Admitting: *Deleted

## 2024-06-07 DIAGNOSIS — J441 Chronic obstructive pulmonary disease with (acute) exacerbation: Secondary | ICD-10-CM

## 2024-06-07 DIAGNOSIS — R0602 Shortness of breath: Secondary | ICD-10-CM

## 2024-06-07 MED ORDER — BENZONATATE 100 MG PO CAPS: 100.0000 mg | ORAL_CAPSULE | Freq: Three times a day (TID) | ORAL | 0 refills | Status: AC

## 2024-06-07 MED ORDER — IPRATROPIUM-ALBUTEROL 0.5-2.5 (3) MG/3ML IN SOLN
3.0000 mL | Freq: Once | RESPIRATORY_TRACT | Status: AC
  Administered 2024-06-07: 3 mL via RESPIRATORY_TRACT

## 2024-06-07 MED ORDER — METHYLPREDNISOLONE 4 MG PO TBPK: ORAL_TABLET | ORAL | 0 refills | Status: AC

## 2024-06-07 MED ORDER — GUAIFENESIN ER 600 MG PO TB12: 600.0000 mg | ORAL_TABLET | Freq: Two times a day (BID) | ORAL | 0 refills | Status: AC

## 2024-06-07 MED ORDER — ALBUTEROL SULFATE HFA 108 (90 BASE) MCG/ACT IN AERS: 2.0000 | INHALATION_SPRAY | RESPIRATORY_TRACT | 0 refills | Status: AC | PRN

## 2024-06-07 MED ORDER — METHYLPREDNISOLONE SODIUM SUCC 125 MG IJ SOLR
60.0000 mg | Freq: Once | INTRAMUSCULAR | Status: AC
  Administered 2024-06-07: 60 mg via INTRAMUSCULAR

## 2024-06-07 NOTE — Discharge Instructions (Signed)
 Please follow-up with PCP as soon as possible.   Will send you steroids, mucinex , and cough suppresant for symptoms. If symptoms worsen or do not significantly improve in 3-5 days we should report to the ED.

## 2024-06-07 NOTE — ED Triage Notes (Signed)
 Pt reports sob x 6-7 days. Worse with exertion. History of asthma. She has been using her neb machine every 3 hours

## 2024-06-07 NOTE — ED Provider Notes (Addendum)
 " [name]    CSN: [id number] Arrival date & time: 06/07/24  1047      History   Chief Complaint Chief Complaint  Patient presents with   Shortness of Breath    HPI Monica Estrada is a 59 y.o. female.   Pt with a hx of asthma, presents today due to persistent cough for the past 6-7 days. Pt denies fever or chills. Pt does admits to nausea and reduced appetite. Pt states that she has been using her inhalers and some dayquil for symptoms with no significant relief. Pt states that she gets something similar every year.   The history is provided by the patient.  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   Narrow angle glaucoma of right eye 02/24/2020   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   Hypertensive retinopathy of both eyes 12/07/2015   Nuclear sclerotic cataract of both eyes 09/28/2015   Posterior synechiae (iris), 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   Wheezing 04/01/2015   Essential hypertension 04/17/2014   TSH elevation 04/17/2014   Medication management 04/17/2014   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   Cystoid macular edema, right 01/31/2013   Uveitis, intermediate, right 01/31/2013   History of hypokalemia 11/26/2012   Tingling 11/26/2012   BMI 32.0-32.9,adult 01/08/2012   Umbilical hernia  possible from lap site  12/05/2011   Preventative health care 11/12/2010   Uveitis    History of hypothyroidism    NONSPECIFIC ABN FINDING RAD & OTH EXAM GI  TRACT 11/25/2009   Anemia 11/03/2009   AUTOIMMUNE THYROIDITIS 11/20/2008   UVEITIS 12/12/2006   Hyperlipemia 12/11/2006    Past Surgical History:  Procedure Laterality Date   CHOLECYSTECTOMY     1999   COLONOSCOPY     TUBAL LIGATION  12/31/2010   Procedure: BILATERAL TUBAL LIGATION;  Surgeon: Krystal BIRCH Meisinger;  Location: [facility];  Service: Gynecology;  Laterality: Bilateral;  Bilateral post partum tubal ligation    OB History     Gravida  2   Para  2   Term  2   Preterm  0   AB  0   Living  2      SAB  0   IAB  0   Ectopic  0   Multiple  0   Live Births  1            Home Medications    Prior to Admission medications  Medication Sig Start Date End Date Taking? Authorizing Provider  ACCU-CHEK GUIDE test strip TEST UP TO 4 TIMES DAILY AS DIRECTED 10/28/22  Yes Webb, Padonda B, FNP  Accu-Chek Softclix Lancets lancets TEST UP TO 4 TIMES DAILY 01/04/22  Yes Panosh, Wanda K, MD  adalimumab (HUMIRA, 2 PEN,) 40 MG/0.4ML pen Inject 0.4 mLs into the skin as directed. 07/11/22  06/07/24 Yes [provider]  albuterol  (VENTOLIN  HFA) 108 (90 Base) MCG/ACT inhaler Inhale 2 puffs into the lungs every 4 (four) hours as needed for wheezing or shortness of breath. 06/07/24  Yes Andra Krabbe C, PA-C  amLODipine  (NORVASC ) 10 MG tablet TAKE 1 TABLET BY MOUTH EVERY DAY 11/27/23  Yes Panosh, Wanda K, MD  aspirin 81 MG EC tablet Take by mouth.   Yes [provider]  atropine  1 % ophthalmic solution Place 1 drop into the right eye 2 (two) times daily.   Yes [provider]  azaTHIOprine  (IMURAN ) 50 MG tablet Take 1 tablet by mouth daily. 07/11/22  Yes [provider]  benzonatate  (TESSALON ) 100 MG capsule Take 1 capsule (100 mg total) by mouth every 8 (eight) hours. 06/07/24  Yes Andra Krabbe C, PA-C  blood glucose meter kit and supplies KIT Dispense based on patient and insurance preference. Use up to four times daily as directed. 03/04/24   Yes Panosh, Wanda K, MD  Blood Glucose Monitoring Suppl (ACCU-CHEK GUIDE) w/Device KIT Test up to 4 times daily as directed 03/04/24  Yes Panosh, Apolinar POUR, MD  brimonidine  (ALPHAGAN ) 0.2 % ophthalmic solution Place 1 drop into both eyes every 8 (eight) hours. 05/13/20  Yes [provider]  carvedilol  (COREG ) 12.5 MG tablet 12.5  mg 1 po tab  in am and 18.75 mg 1.5 tab in pm as directed 02/07/24  Yes Panosh, Wanda K, MD  clobetasol ointment (TEMOVATE) 0.05 % Apply 1 Application topically 2 (two) times daily. 05/21/23  Yes [provider]  dapagliflozin  propanediol (FARXIGA ) 10 MG TABS tablet TAKE 1 TABLET BY MOUTH EVERY DAY BEFORE BREAKFAST 02/06/24  Yes Panosh, Apolinar POUR, MD  dorzolamide -timolol  (COSOPT ) 2-0.5 % ophthalmic solution 1 drop 2 (two) times daily.   Yes [provider]  ferrous sulfate 325 (65 FE) MG tablet Take 650 mg by mouth daily with breakfast.   Yes [provider]  folic acid (FOLVITE) 1 MG tablet Take 1 tablet by mouth daily. 11/19/18  Yes [provider]  guaiFENesin  (MUCINEX ) 600 MG 12 hr tablet Take 1 tablet (600 mg total) by mouth 2 (two) times daily for 10 days. 06/07/24 06/17/24 Yes Andra Krabbe C, PA-C  ipratropium-albuterol  (DUONEB) 0.5-2.5 (3) MG/3ML SOLN TAKE 3 MLS BY NEBULIZATION EVERY 4 HOURS AS NEEDED (SHORTNESS OF BREATH/WHEEZING). 11/27/23  Yes Panosh, Apolinar POUR, MD  methylPREDNISolone  (MEDROL  DOSEPAK) 4 MG TBPK tablet Take as directed on back of package 06/07/24  Yes Andra Krabbe C, PA-C  potassium chloride  SA (KLOR-CON  M) 20 MEQ tablet Take 1 tablet (20 mEq total) by mouth daily. 03/04/24  Yes Panosh, Apolinar POUR, MD  rosuvastatin  (CRESTOR ) 10 MG tablet TAKE 1 TABLET BY MOUTH EVERY DAY 11/27/23  Yes Panosh, Wanda K, MD  Semaglutide , 1 MG/DOSE, (OZEMPIC , 1 MG/DOSE,) 4 MG/3ML SOPN INJECT 1 MG AS DIRECTED ONCE A WEEK. DOSAGE INCREASE 06/03/24  Yes Panosh, Wanda K, MD  tacrolimus (PROGRAF) 1 MG capsule Take 1 mg by mouth 2 (two)  times daily. 07/11/22  Yes [provider]  valACYclovir (VALTREX) 1000 MG tablet Take 1,000 mg by mouth 2 (two) times daily. 07/11/22  Yes [provider]  valsartan -hydrochlorothiazide  (DIOVAN -HCT) 160-25 MG tablet TAKE 1 TABLET BY MOUTH DAILY. CHANGE FROM LSINIOPRIL HCTZ 02/29/24  Yes Panosh, Apolinar POUR, MD  chlorhexidine  (PERIDEX ) 0.12 % solution Use as directed 15 mLs in the mouth or throat 2 (two) times daily. Swish in mouth for 30 seconds then spit out twice  a day after brushing teeth. Do not eat or drink for at least 30 minutes afterwards Patient not taking: Reported on 04/12/2024 08/15/23   Iola Lukes, FNP  dextromethorphan -guaiFENesin  (MUCINEX  DM) 30-600 MG 12hr tablet Take 1 tablet by mouth daily as needed. Patient not taking: Reported on 04/12/2024 05/25/20   [provider]  diclofenac  (VOLTAREN ) 75 MG EC tablet Take 1 tablet (75 mg total) by mouth 2 (two) times daily. Patient not taking: Reported on 06/07/2024 04/12/24   Andra Corean BROCKS, PA-C  Fluticasone -Salmeterol (ADVAIR DISKUS) 250-50 MCG/DOSE AEPB Inhale 1 puff into the lungs in the morning and at bedtime. Patient taking differently: Inhale 1 puff into the lungs as needed. 05/25/20 04/12/24  [place], Camellia PARAS, DO  glucose blood (ACCU-CHEK GUIDE TEST) test strip Use as instructed 03/04/24   Panosh, Wanda K, MD  ibuprofen  (ADVIL ) 800 MG tablet Take 1 tablet (800 mg total) by mouth every 8 (eight) hours as needed (PAIN). Patient not taking: Reported on 06/07/2024 08/15/23   Murrill, Samantha, FNP  latanoprost  (XALATAN ) 0.005 % ophthalmic solution Place 1 drop into both eyes at bedtime. Patient not taking: Reported on 04/12/2024 03/22/20   [provider]  nystatin ointment (MYCOSTATIN) Apply 1 application  topically in the morning and at bedtime. 05/12/20   [provider]  prednisoLONE  acetate (PRED FORTE ) 1 % ophthalmic suspension Place 1 drop into both eyes 2 (two) times daily. Patient  not taking: Reported on 04/12/2024    [provider]  tiotropium (SPIRIVA  HANDIHALER) 18 MCG inhalation capsule Place 1 capsule (18 mcg total) into inhaler and inhale daily. Patient taking differently: Place 18 mcg into inhaler and inhale as needed. 05/25/20 02/07/24  [place], Eric J, DO  triamcinolone  cream (KENALOG ) 0.1 % Apply 1 Application topically 2 (two) times daily. 12/11/22   Burchette, Wolm ORN, MD    Family History Family History  Problem Relation Age of Onset   Hypertension Mother    Diabetes Mother    Thyroid  disease Mother    Diabetes Father    Hypertension Father    Lupus Sister    Rheum arthritis Sister    Other Sister        lamb disease   Stroke Sister    Diabetes Brother    Diabetes Son    Colon cancer Neg Hx    Colon polyps Neg Hx    Esophageal cancer Neg Hx    Rectal cancer Neg Hx    Stomach cancer Neg Hx     Social History Social History[1]   Allergies   Patient has no known allergies.   Review of Systems Review of Systems  Respiratory:  Positive for shortness of breath.      Physical Exam Triage Vital Signs ED Triage Vitals  Encounter Vitals Group     BP 06/07/24 1058 107/70     Girls Systolic BP Percentile --      Girls Diastolic BP Percentile --      Boys Systolic BP Percentile --      Boys Diastolic BP Percentile --      Pulse Rate 06/07/24 1058 86     Resp 06/07/24 1058 20     Temp 06/07/24 1058 98.4 F (36.9 C)     Temp Source 06/07/24 1058 Oral     SpO2 06/07/24 1058 (!) 89 %     Weight --      Height --      Head Circumference --      Peak  Flow --      Pain Score 06/07/24 1103 6     Pain Loc --      Pain Education --      Exclude from Growth Chart --    No data found.  Updated Vital Signs BP 107/70 (BP Location: Right Arm)   Pulse 86   Temp 98.4 F (36.9 C) (Oral)   Resp 20   LMP  (LMP Unknown)   SpO2 (!) 89%   Visual Acuity Right Eye Distance:   Left Eye Distance:   Bilateral Distance:    Right Eye  Near:   Left Eye Near:    Bilateral Near:     Physical Exam Vitals and nursing note reviewed.  Constitutional:      General: She is not in acute distress.    Appearance: She is well-developed. She is not ill-appearing, toxic-appearing or diaphoretic.  Eyes:     General: No scleral icterus. Cardiovascular:     Rate and Rhythm: Normal rate and regular rhythm.     Heart sounds: Normal heart sounds.  Pulmonary:     Effort: Respiratory distress (persistent coughing during exam) present.     Breath sounds: Wheezing and rhonchi present.  Skin:    General: Skin is warm.  Neurological:     Mental Status: She is alert and oriented to person, place, and time.  Psychiatric:        Mood and Affect: Mood normal.        Behavior: Behavior normal.      UC Treatments / Results  Labs (all labs ordered are listed, but only abnormal results are displayed) Labs Reviewed - No data to display  EKG   Radiology DG Chest 2 View Result Date: 06/07/2024 EXAM: 2 VIEW(S) XRAY OF THE CHEST 06/07/2024 11:53:16 AM COMPARISON: 05/31/22 CLINICAL HISTORY: Shortness of breath. FINDINGS: LUNGS AND PLEURA: No focal pulmonary opacity. No pleural effusion. No pneumothorax. HEART AND MEDIASTINUM: No acute abnormality of the cardiac and mediastinal silhouettes. BONES AND SOFT TISSUES: Cholecystectomy clips noted. Dextroconvex scoliosis of thoracic spine. IMPRESSION: 1. No acute cardiopulmonary abnormality. 2. Dextroconvex thoracic scoliosis. Electronically signed by: Ryan Salvage MD 06/07/2024 12:24 PM EST RP Workstation: [id number]    Procedures Procedures (including critical care time)  Medications Ordered in UC Medications  ipratropium-albuterol  (DUONEB) 0.5-2.5 (3) MG/3ML nebulizer solution 3 mL (3 mLs Nebulization Given 06/07/24 1136)  methylPREDNISolone  sodium succinate (SOLU-MEDROL ) 125 mg/2 mL injection 60 mg (60 mg Intramuscular Given 06/07/24 1147)    Initial Impression / Assessment and Plan / UC  Course  I have reviewed the triage vital signs and the nursing notes.  Pertinent labs & imaging results that were available during my care of the patient were reviewed by me and considered in my medical decision making (see chart for details).      Final Clinical Impressions(s) / UC Diagnoses   Final diagnoses:  Shortness of breath  Bronchitis, chronic obstructive, with exacerbation [facility])     Discharge Instructions      Please follow-up with PCP as soon as possible.   Will send you steroids, mucinex , and cough suppresant for symptoms. If symptoms worsen or do not significantly improve in 3-5 days we should report to the ED.      ED Prescriptions     Medication Sig Dispense Auth. Provider   methylPREDNISolone  (MEDROL  DOSEPAK) 4 MG TBPK tablet Take as directed on back of package 21 tablet Andra Corean BROCKS, PA-C   guaiFENesin  (MUCINEX ) 600 MG 12 hr  tablet Take 1 tablet (600 mg total) by mouth 2 (two) times daily for 10 days. 20 tablet Andra Krabbe C, PA-C   benzonatate  (TESSALON ) 100 MG capsule Take 1 capsule (100 mg total) by mouth every 8 (eight) hours. 30 capsule Andra Krabbe C, PA-C   albuterol  (VENTOLIN  HFA) 108 (90 Base) MCG/ACT inhaler Inhale 2 puffs into the lungs every 4 (four) hours as needed for wheezing or shortness of breath. 8 g Andra Krabbe BROCKS, PA-C      PDMP not reviewed this encounter.    Andra Krabbe BROCKS, PA-C 06/07/24 1238     [1]  Social History Tobacco Use   Smoking status: Never   Smokeless tobacco: Never  Vaping Use   Vaping status: Never Used  Substance Use Topics   Alcohol use: No   Drug use: No     Andra Krabbe BROCKS, PA-C 06/07/24 1246  "

## 2024-09-10 ENCOUNTER — Ambulatory Visit: Admitting: Dermatology
